# Patient Record
Sex: Male | Born: 1980 | Race: White | Hispanic: No | Marital: Single | Smoking: Current every day smoker
Health system: Southern US, Community
[De-identification: ages and names within clinical notes are randomized; demographics above are authoritative.]

## PROBLEM LIST (undated history)

## (undated) DIAGNOSIS — F1010 Alcohol abuse, uncomplicated: Secondary | ICD-10-CM

## (undated) DIAGNOSIS — Z720 Tobacco use: Secondary | ICD-10-CM

## (undated) DIAGNOSIS — F329 Major depressive disorder, single episode, unspecified: Secondary | ICD-10-CM

## (undated) DIAGNOSIS — F10231 Alcohol dependence with withdrawal delirium: Secondary | ICD-10-CM

## (undated) DIAGNOSIS — K859 Acute pancreatitis without necrosis or infection, unspecified: Secondary | ICD-10-CM

## (undated) DIAGNOSIS — F32A Depression, unspecified: Secondary | ICD-10-CM

## (undated) DIAGNOSIS — R740 Nonspecific elevation of levels of transaminase and lactic acid dehydrogenase [LDH]: Secondary | ICD-10-CM

## (undated) DIAGNOSIS — E119 Type 2 diabetes mellitus without complications: Secondary | ICD-10-CM

## (undated) DIAGNOSIS — K7010 Alcoholic hepatitis without ascites: Secondary | ICD-10-CM

---

## 2005-06-07 ENCOUNTER — Ambulatory Visit (HOSPITAL_COMMUNITY): Admission: RE | Admit: 2005-06-07 | Discharge: 2005-06-07 | Payer: Self-pay | Admitting: Family Medicine

## 2010-05-22 ENCOUNTER — Emergency Department (HOSPITAL_COMMUNITY)
Admission: EM | Admit: 2010-05-22 | Discharge: 2010-05-22 | Disposition: A | Discharge status: Home / Self Care | Payer: Self-pay | Attending: Emergency Medicine | Admitting: Emergency Medicine

## 2010-05-22 DIAGNOSIS — F1010 Alcohol abuse, uncomplicated: Secondary | ICD-10-CM | POA: Insufficient documentation

## 2010-05-22 DIAGNOSIS — Z046 Encounter for general psychiatric examination, requested by authority: Secondary | ICD-10-CM | POA: Insufficient documentation

## 2010-05-22 LAB — DIFFERENTIAL
Basophils Absolute: 0 10*3/uL (ref 0.0–0.1)
Basophils Relative: 1 % (ref 0–1)
Eosinophils Absolute: 0 10*3/uL (ref 0.0–0.7)
Lymphs Abs: 2.4 10*3/uL (ref 0.7–4.0)
Monocytes Absolute: 0.2 10*3/uL (ref 0.1–1.0)
Neutrophils Relative %: 55 % (ref 43–77)

## 2010-05-22 LAB — RAPID URINE DRUG SCREEN, HOSP PERFORMED
Amphetamines: NOT DETECTED
Barbiturates: NOT DETECTED
Benzodiazepines: NOT DETECTED
Tetrahydrocannabinol: NOT DETECTED

## 2010-05-22 LAB — CBC
HCT: 42.2 % (ref 39.0–52.0)
RBC: 4.95 MIL/uL (ref 4.22–5.81)

## 2010-05-22 LAB — HEPATIC FUNCTION PANEL
AST: 83 U/L — ABNORMAL HIGH (ref 0–37)
Albumin: 4.8 g/dL (ref 3.5–5.2)
Bilirubin, Direct: 0.3 mg/dL (ref 0.0–0.3)
Indirect Bilirubin: 0.9 mg/dL (ref 0.3–0.9)
Total Bilirubin: 1.2 mg/dL (ref 0.3–1.2)
Total Protein: 7.8 g/dL (ref 6.0–8.3)

## 2010-05-22 LAB — BASIC METABOLIC PANEL: Calcium: 8.8 mg/dL (ref 8.4–10.5)

## 2010-06-06 ENCOUNTER — Emergency Department (HOSPITAL_COMMUNITY)
Admission: EM | Admit: 2010-06-06 | Discharge: 2010-06-06 | Disposition: A | Discharge status: Home / Self Care | Payer: Self-pay | Attending: Emergency Medicine | Admitting: Emergency Medicine

## 2010-06-06 ENCOUNTER — Emergency Department (HOSPITAL_COMMUNITY): Payer: Self-pay

## 2010-06-06 DIAGNOSIS — F411 Generalized anxiety disorder: Secondary | ICD-10-CM | POA: Insufficient documentation

## 2010-06-06 DIAGNOSIS — F1010 Alcohol abuse, uncomplicated: Secondary | ICD-10-CM | POA: Insufficient documentation

## 2010-06-06 LAB — CBC
HCT: 38.9 % — ABNORMAL LOW (ref 39.0–52.0)
MCH: 30.3 pg (ref 26.0–34.0)
MCHC: 35.7 g/dL (ref 30.0–36.0)
MCV: 84.9 fL (ref 78.0–100.0)
RBC: 4.58 MIL/uL (ref 4.22–5.81)
RDW: 13.8 % (ref 11.5–15.5)
WBC: 5.5 10*3/uL (ref 4.0–10.5)

## 2010-06-06 LAB — BASIC METABOLIC PANEL
CO2: 27 mEq/L (ref 19–32)
Calcium: 9.2 mg/dL (ref 8.4–10.5)
Chloride: 97 mEq/L (ref 96–112)
Creatinine, Ser: 0.64 mg/dL (ref 0.4–1.5)
GFR calc Af Amer: >60 mL/min (ref >60)
Glucose, Bld: 129 mg/dL — ABNORMAL HIGH (ref 70–99)

## 2010-06-06 LAB — URINALYSIS, ROUTINE W REFLEX MICROSCOPIC
Bilirubin Urine: NEGATIVE
Glucose, UA: NEGATIVE mg/dL
Hgb urine dipstick: NEGATIVE
Leukocytes, UA: NEGATIVE
Urobilinogen, UA: 1 mg/dL (ref 0.0–1.0)

## 2010-06-06 LAB — COMPREHENSIVE METABOLIC PANEL
Albumin: 4.7 g/dL (ref 3.5–5.2)
Alkaline Phosphatase: 78 U/L (ref 39–117)
BUN: 13 mg/dL (ref 6–23)
CO2: 26 mEq/L (ref 19–32)
Calcium: 9.2 mg/dL (ref 8.4–10.5)
GFR calc non Af Amer: >60 mL/min (ref >60)
Sodium: 136 mEq/L (ref 135–145)
Total Bilirubin: 1.1 mg/dL (ref 0.3–1.2)
Total Protein: 8.1 g/dL (ref 6.0–8.3)

## 2010-06-06 LAB — RAPID URINE DRUG SCREEN, HOSP PERFORMED: Opiates: NOT DETECTED

## 2010-06-06 LAB — URINE MICROSCOPIC-ADD ON

## 2010-06-30 ENCOUNTER — Emergency Department (HOSPITAL_COMMUNITY)
Admission: EM | Admit: 2010-06-30 | Discharge: 2010-06-30 | Disposition: A | Discharge status: Home / Self Care | Payer: Self-pay | Attending: Emergency Medicine | Admitting: Emergency Medicine

## 2010-06-30 DIAGNOSIS — R4789 Other speech disturbances: Secondary | ICD-10-CM | POA: Insufficient documentation

## 2010-06-30 DIAGNOSIS — R4182 Altered mental status, unspecified: Secondary | ICD-10-CM | POA: Insufficient documentation

## 2010-06-30 LAB — RAPID URINE DRUG SCREEN, HOSP PERFORMED
Barbiturates: NOT DETECTED
Cocaine: NOT DETECTED
Opiates: NOT DETECTED
Tetrahydrocannabinol: NOT DETECTED

## 2010-06-30 LAB — COMPREHENSIVE METABOLIC PANEL
ALT: 27 U/L (ref 0–53)
AST: 31 U/L (ref 0–37)
CO2: 25 mEq/L (ref 19–32)
Chloride: 102 mEq/L (ref 96–112)
Creatinine, Ser: 0.67 mg/dL (ref 0.4–1.5)
GFR calc Af Amer: >60 mL/min (ref >60)
Potassium: 3.7 mEq/L (ref 3.5–5.1)
Total Bilirubin: 0.4 mg/dL (ref 0.3–1.2)

## 2010-06-30 LAB — CBC
HCT: 42.3 % (ref 39.0–52.0)
Hemoglobin: 14.5 g/dL (ref 13.0–17.0)
MCHC: 34.3 g/dL (ref 30.0–36.0)
MCV: 86.3 fL (ref 78.0–100.0)
RBC: 4.9 MIL/uL (ref 4.22–5.81)
RDW: 13.3 % (ref 11.5–15.5)

## 2010-06-30 LAB — DIFFERENTIAL
Basophils Relative: 0 % (ref 0–1)
Eosinophils Relative: 0 % (ref 0–5)
Neutrophils Relative %: 58 % (ref 43–77)

## 2010-06-30 LAB — GLUCOSE, CAPILLARY

## 2010-10-24 ENCOUNTER — Emergency Department (HOSPITAL_COMMUNITY)
Admission: EM | Admit: 2010-10-24 | Discharge: 2010-10-24 | Disposition: A | Discharge status: Home / Self Care | Payer: Self-pay | Attending: Emergency Medicine | Admitting: Emergency Medicine

## 2010-10-24 DIAGNOSIS — F1010 Alcohol abuse, uncomplicated: Secondary | ICD-10-CM | POA: Insufficient documentation

## 2010-10-24 LAB — COMPREHENSIVE METABOLIC PANEL
ALT: 72 U/L — ABNORMAL HIGH (ref 0–53)
AST: 90 U/L — ABNORMAL HIGH (ref 0–37)
Calcium: 9.5 mg/dL (ref 8.4–10.5)
GFR calc Af Amer: >60 mL/min (ref >60)
Sodium: 137 mEq/L (ref 135–145)
Total Protein: 8.4 g/dL — ABNORMAL HIGH (ref 6.0–8.3)

## 2010-10-24 LAB — DIFFERENTIAL
Lymphocytes Relative: 14 % (ref 12–46)
Monocytes Absolute: 0.3 10*3/uL (ref 0.1–1.0)
Neutro Abs: 7.7 10*3/uL (ref 1.7–7.7)
Neutrophils Relative %: 82 % — ABNORMAL HIGH (ref 43–77)

## 2010-10-24 LAB — CBC
HCT: 41.2 % (ref 39.0–52.0)
MCHC: 36.2 g/dL — ABNORMAL HIGH (ref 30.0–36.0)
RBC: 5 MIL/uL (ref 4.22–5.81)

## 2016-07-16 DIAGNOSIS — F10231 Alcohol dependence with withdrawal delirium: Secondary | ICD-10-CM

## 2016-07-16 DIAGNOSIS — F10931 Alcohol use, unspecified with withdrawal delirium: Secondary | ICD-10-CM

## 2016-07-16 HISTORY — DX: Alcohol dependence with withdrawal delirium: F10.231

## 2016-07-16 HISTORY — DX: Alcohol use, unspecified with withdrawal delirium: F10.931

## 2016-07-17 ENCOUNTER — Inpatient Hospital Stay (HOSPITAL_COMMUNITY)
Admission: EM | Admit: 2016-07-17 | Discharge: 2016-07-22 | DRG: 439 | Disposition: A | Discharge status: Home / Self Care | Payer: Self-pay | Attending: Internal Medicine | Admitting: Internal Medicine

## 2016-07-17 ENCOUNTER — Encounter (HOSPITAL_COMMUNITY): Payer: Self-pay

## 2016-07-17 ENCOUNTER — Emergency Department (HOSPITAL_COMMUNITY): Payer: Self-pay

## 2016-07-17 DIAGNOSIS — Z720 Tobacco use: Secondary | ICD-10-CM

## 2016-07-17 DIAGNOSIS — Z950 Presence of cardiac pacemaker: Secondary | ICD-10-CM

## 2016-07-17 DIAGNOSIS — K851 Biliary acute pancreatitis without necrosis or infection: Principal | ICD-10-CM

## 2016-07-17 DIAGNOSIS — F17210 Nicotine dependence, cigarettes, uncomplicated: Secondary | ICD-10-CM | POA: Diagnosis present

## 2016-07-17 DIAGNOSIS — D72829 Elevated white blood cell count, unspecified: Secondary | ICD-10-CM

## 2016-07-17 DIAGNOSIS — R17 Unspecified jaundice: Secondary | ICD-10-CM

## 2016-07-17 DIAGNOSIS — K852 Alcohol induced acute pancreatitis without necrosis or infection: Secondary | ICD-10-CM | POA: Diagnosis present

## 2016-07-17 DIAGNOSIS — R7401 Elevation of levels of liver transaminase levels: Secondary | ICD-10-CM

## 2016-07-17 DIAGNOSIS — R740 Nonspecific elevation of levels of transaminase and lactic acid dehydrogenase [LDH]: Secondary | ICD-10-CM

## 2016-07-17 DIAGNOSIS — E871 Hypo-osmolality and hyponatremia: Secondary | ICD-10-CM | POA: Diagnosis present

## 2016-07-17 DIAGNOSIS — K5900 Constipation, unspecified: Secondary | ICD-10-CM | POA: Diagnosis present

## 2016-07-17 DIAGNOSIS — K7010 Alcoholic hepatitis without ascites: Secondary | ICD-10-CM | POA: Diagnosis present

## 2016-07-17 DIAGNOSIS — F1010 Alcohol abuse, uncomplicated: Secondary | ICD-10-CM | POA: Diagnosis present

## 2016-07-17 DIAGNOSIS — K8521 Alcohol induced acute pancreatitis with uninfected necrosis: Secondary | ICD-10-CM

## 2016-07-17 HISTORY — DX: Alcohol abuse, uncomplicated: F10.10

## 2016-07-17 HISTORY — DX: Elevation of levels of liver transaminase levels: R74.01

## 2016-07-17 HISTORY — DX: Tobacco use: Z72.0

## 2016-07-17 LAB — CBC WITH DIFFERENTIAL/PLATELET
Basophils Absolute: 0 10*3/uL (ref 0.0–0.1)
Basophils Relative: 0 %
Eosinophils Absolute: 0 10*3/uL (ref 0.0–0.7)
Eosinophils Relative: 0 %
HCT: 41.6 % (ref 39.0–52.0)
Hemoglobin: 15 g/dL (ref 13.0–17.0)
LYMPHS PCT: 5 %
Lymphs Abs: 0.6 10*3/uL — ABNORMAL LOW (ref 0.7–4.0)
MCH: 30.1 pg (ref 26.0–34.0)
MCHC: 36.1 g/dL — AB (ref 30.0–36.0)
MCV: 83.5 fL (ref 78.0–100.0)
MONO ABS: 0.6 10*3/uL (ref 0.1–1.0)
MONOS PCT: 4 %
NEUTROS ABS: 12.3 10*3/uL — AB (ref 1.7–7.7)
Neutrophils Relative %: 91 %
Platelets: 98 10*3/uL — ABNORMAL LOW (ref 150–400)
RBC: 4.98 MIL/uL (ref 4.22–5.81)
RDW: 17.2 % — AB (ref 11.5–15.5)
WBC: 13.5 10*3/uL — ABNORMAL HIGH (ref 4.0–10.5)

## 2016-07-17 LAB — COMPREHENSIVE METABOLIC PANEL
ALT: 177 U/L — ABNORMAL HIGH (ref 17–63)
ANION GAP: 21 — AB (ref 5–15)
AST: 367 U/L — ABNORMAL HIGH (ref 15–41)
Albumin: 3.1 g/dL — ABNORMAL LOW (ref 3.5–5.0)
Alkaline Phosphatase: 675 U/L — ABNORMAL HIGH (ref 38–126)
BUN: 15 mg/dL (ref 6–20)
CO2: 22 mmol/L (ref 22–32)
Calcium: 7.2 mg/dL — ABNORMAL LOW (ref 8.9–10.3)
Chloride: 83 mmol/L — ABNORMAL LOW (ref 101–111)
Creatinine, Ser: 0.49 mg/dL — ABNORMAL LOW (ref 0.61–1.24)
GFR calc Af Amer: >60 mL/min (ref >60)
Glucose, Bld: 439 mg/dL — ABNORMAL HIGH (ref 65–99)
POTASSIUM: 4.1 mmol/L (ref 3.5–5.1)
Sodium: 126 mmol/L — ABNORMAL LOW (ref 135–145)
TOTAL PROTEIN: 6.5 g/dL (ref 6.5–8.1)
Total Bilirubin: 21.8 mg/dL (ref 0.3–1.2)

## 2016-07-17 LAB — ACETAMINOPHEN LEVEL: Acetaminophen (Tylenol), Serum: <10 ug/mL — ABNORMAL LOW (ref 10–30)

## 2016-07-17 LAB — LIPASE, BLOOD: Lipase: 103 U/L — ABNORMAL HIGH (ref 11–51)

## 2016-07-17 LAB — PROTIME-INR
INR: 1.37
PROTHROMBIN TIME: 17 s — AB (ref 11.4–15.2)

## 2016-07-17 LAB — ETHANOL: ALCOHOL ETHYL (B): 109 mg/dL — AB (ref <5)

## 2016-07-17 MED ORDER — ONDANSETRON HCL 4 MG PO TABS
4.0000 mg | ORAL_TABLET | Freq: Four times a day (QID) | ORAL | Status: DC | PRN
  Administered 2016-07-20: 4 mg via ORAL
  Filled 2016-07-17: qty 1

## 2016-07-17 MED ORDER — FOLIC ACID 1 MG PO TABS
1.0000 mg | ORAL_TABLET | Freq: Every day | ORAL | Status: DC
  Administered 2016-07-17 – 2016-07-22 (×6): 1 mg via ORAL
  Filled 2016-07-17 (×6): qty 1

## 2016-07-17 MED ORDER — LORAZEPAM 2 MG/ML IJ SOLN
0.5000 mg | Freq: Once | INTRAMUSCULAR | Status: AC
  Administered 2016-07-17: 0.5 mg via INTRAVENOUS

## 2016-07-17 MED ORDER — SODIUM CHLORIDE 0.9 % IV BOLUS (SEPSIS)
1000.0000 mL | Freq: Once | INTRAVENOUS | Status: AC
  Administered 2016-07-17: 1000 mL via INTRAVENOUS

## 2016-07-17 MED ORDER — HYDROMORPHONE HCL 1 MG/ML IJ SOLN
1.0000 mg | Freq: Once | INTRAMUSCULAR | Status: AC
  Administered 2016-07-17: 1 mg via INTRAVENOUS
  Filled 2016-07-17: qty 1

## 2016-07-17 MED ORDER — LORAZEPAM 2 MG/ML IJ SOLN
0.0000 mg | Freq: Two times a day (BID) | INTRAMUSCULAR | Status: DC
  Administered 2016-07-21: 1 mg via INTRAVENOUS
  Filled 2016-07-17 (×4): qty 1

## 2016-07-17 MED ORDER — VITAMIN B-1 100 MG PO TABS
100.0000 mg | ORAL_TABLET | Freq: Every day | ORAL | Status: DC
Start: 2016-07-17 — End: 2016-07-22
  Administered 2016-07-17 – 2016-07-22 (×5): 100 mg via ORAL
  Filled 2016-07-17 (×5): qty 1

## 2016-07-17 MED ORDER — NICOTINE 21 MG/24HR TD PT24
21.0000 mg | MEDICATED_PATCH | Freq: Every day | TRANSDERMAL | Status: DC
  Administered 2016-07-17 – 2016-07-22 (×6): 21 mg via TRANSDERMAL
  Filled 2016-07-17 (×6): qty 1

## 2016-07-17 MED ORDER — PROMETHAZINE HCL 25 MG/ML IJ SOLN
12.5000 mg | Freq: Four times a day (QID) | INTRAMUSCULAR | Status: DC | PRN
  Administered 2016-07-17 – 2016-07-19 (×4): 12.5 mg via INTRAVENOUS
  Filled 2016-07-17 (×4): qty 1

## 2016-07-17 MED ORDER — MORPHINE SULFATE (PF) 2 MG/ML IV SOLN
1.0000 mg | INTRAVENOUS | Status: DC | PRN
  Administered 2016-07-17 – 2016-07-22 (×10): 1 mg via INTRAVENOUS
  Filled 2016-07-17 (×10): qty 1

## 2016-07-17 MED ORDER — PANTOPRAZOLE SODIUM 40 MG IV SOLR
40.0000 mg | Freq: Once | INTRAVENOUS | Status: AC
  Administered 2016-07-17: 40 mg via INTRAVENOUS
  Filled 2016-07-17: qty 40

## 2016-07-17 MED ORDER — ONDANSETRON HCL 4 MG/2ML IJ SOLN
4.0000 mg | Freq: Once | INTRAMUSCULAR | Status: AC
  Administered 2016-07-17: 4 mg via INTRAVENOUS
  Filled 2016-07-17: qty 2

## 2016-07-17 MED ORDER — LORAZEPAM 1 MG PO TABS: 1.0000 mg | ORAL_TABLET | Freq: Four times a day (QID) | ORAL | Status: AC | PRN

## 2016-07-17 MED ORDER — IOPAMIDOL (ISOVUE-300) INJECTION 61%
100.0000 mL | Freq: Once | INTRAVENOUS | Status: AC | PRN
  Administered 2016-07-17: 100 mL via INTRAVENOUS

## 2016-07-17 MED ORDER — TRAMADOL HCL 50 MG PO TABS
50.0000 mg | ORAL_TABLET | Freq: Four times a day (QID) | ORAL | Status: DC | PRN
  Administered 2016-07-17 – 2016-07-20 (×2): 50 mg via ORAL
  Filled 2016-07-17 (×3): qty 1

## 2016-07-17 MED ORDER — THIAMINE HCL 100 MG/ML IJ SOLN
100.0000 mg | Freq: Every day | INTRAMUSCULAR | Status: DC
  Administered 2016-07-18: 100 mg via INTRAVENOUS
  Filled 2016-07-17: qty 2

## 2016-07-17 MED ORDER — ONDANSETRON HCL 4 MG/2ML IJ SOLN
4.0000 mg | Freq: Four times a day (QID) | INTRAMUSCULAR | Status: DC | PRN
  Administered 2016-07-17 (×2): 4 mg via INTRAVENOUS
  Filled 2016-07-17 (×2): qty 2

## 2016-07-17 MED ORDER — LORAZEPAM 2 MG/ML IJ SOLN
0.0000 mg | Freq: Four times a day (QID) | INTRAMUSCULAR | Status: AC
  Administered 2016-07-17: 2 mg via INTRAVENOUS
  Administered 2016-07-18: 1 mg via INTRAVENOUS
  Administered 2016-07-18 (×3): 2 mg via INTRAVENOUS
  Administered 2016-07-19 (×3): 1 mg via INTRAVENOUS
  Filled 2016-07-17 (×7): qty 1

## 2016-07-17 MED ORDER — KETOROLAC TROMETHAMINE 30 MG/ML IJ SOLN
30.0000 mg | Freq: Four times a day (QID) | INTRAMUSCULAR | Status: DC | PRN
  Administered 2016-07-17 – 2016-07-22 (×6): 30 mg via INTRAVENOUS
  Filled 2016-07-17 (×6): qty 1

## 2016-07-17 MED ORDER — ADULT MULTIVITAMIN W/MINERALS CH
1.0000 | ORAL_TABLET | Freq: Every day | ORAL | Status: DC
  Administered 2016-07-17 – 2016-07-22 (×6): 1 via ORAL
  Filled 2016-07-17 (×6): qty 1

## 2016-07-17 MED ORDER — SODIUM CHLORIDE 0.9 % IV SOLN
INTRAVENOUS | Status: DC
  Administered 2016-07-17 – 2016-07-21 (×8): via INTRAVENOUS

## 2016-07-17 MED ORDER — LORAZEPAM 2 MG/ML IJ SOLN
INTRAMUSCULAR | Status: AC
  Filled 2016-07-17: qty 1

## 2016-07-17 MED ORDER — LORAZEPAM 2 MG/ML IJ SOLN
1.0000 mg | Freq: Four times a day (QID) | INTRAMUSCULAR | Status: AC | PRN
  Administered 2016-07-17 – 2016-07-18 (×2): 1 mg via INTRAVENOUS
  Filled 2016-07-17 (×2): qty 1

## 2016-07-17 MED ORDER — SENNOSIDES-DOCUSATE SODIUM 8.6-50 MG PO TABS: 1.0000 | ORAL_TABLET | Freq: Every evening | ORAL | Status: DC | PRN

## 2016-07-17 NOTE — H&P (Signed)
History and Physical    Shawn Watkins BJY:[id number] DOB: 1981-01-14 DOA: 07/17/2016  Referring MD/NP/PA: Bethann BerkshireJoseph Zammit, EDP PCP: Patient, No Pcp Per  Patient coming from: Home  Chief Complaint: Abdominal pain  HPI: Shawn Watkins is a 36 y.o. male with h/o alcoholism and tobacco abuse presents today with 3 days of RUQ abdominal pain with nausea and vomiting. His mother noticed his skin was yellow 2 days ago. He was sober for 2 years, but then started drinking again 6 months ago. In the ED he is noticed to be jaundiced, labs: ETOH 109, AP 675, AST 367, ALT 177, Bili 21.8. CT scan abdomen: findings most compatible with acute on chronic pancreatitis. Admission requested.  Past Medical/Surgical History: Past Medical History:  Diagnosis Date  . Alcohol abuse     History reviewed. No pertinent surgical history.  Social History:  has no tobacco, alcohol, and drug history on file.  Allergies: No Known Allergies  Family History:  No H/o major Cardio-vascular disease, HTN, DM in family members  Prior to Admission medications   Medication Sig Start Date End Date Taking? Authorizing Provider  chlordiazePOXIDE (LIBRIUM) 25 MG capsule Take 1 capsule by mouth. 06/21/16  Yes [provider]  folic acid (FOLVITE) 1 MG tablet Take 1 tablet by mouth daily. 06/21/16  Yes [provider]  Multiple Vitamin (MULTIVITAMIN) capsule Take 1 capsule by mouth daily.   Yes [provider]  acetaminophen (TYLENOL) 325 MG tablet Take 650 mg by mouth every 6 (six) hours as needed for mild pain or moderate pain (tooth pain).    [provider]    Review of Systems:  Constitutional: Denies fever, chills, diaphoresis, appetite change and fatigue.  HEENT: Denies photophobia, eye pain, redness, hearing loss, ear pain, congestion, sore throat, rhinorrhea, sneezing, mouth sores, trouble swallowing, neck pain, neck stiffness and tinnitus.   Respiratory: Denies SOB, DOE, cough, chest  tightness,  and wheezing.   Cardiovascular: Denies chest pain, palpitations and leg swelling.  Gastrointestinal: Denies  diarrhea, constipation, blood in stool and abdominal distention.  Genitourinary: Denies dysuria, urgency, frequency, hematuria, flank pain and difficulty urinating.  Endocrine: Denies: hot or cold intolerance, sweats, changes in hair or nails, polyuria, polydipsia. Musculoskeletal: Denies myalgias, back pain, joint swelling, arthralgias and gait problem.  Skin: Denies pallor, rash and wound.  Neurological: Denies dizziness, seizures, syncope, weakness, light-headedness, numbness and headaches.  Hematological: Denies adenopathy. Easy bruising, personal or family bleeding history  Psychiatric/Behavioral: Denies suicidal ideation, mood changes, confusion, nervousness, sleep disturbance and agitation    Physical Exam: Vitals:   07/17/16 1431 07/17/16 1543 07/17/16 1600 07/17/16 1645  BP: (!) 127/98 (!) 137/93 (!) 131/96 (!) 124/96  Pulse: 98 (!) 108 98 (!) 102  Resp: 19 20  20   Temp: 98.4 F (36.9 C)     TempSrc: Oral     SpO2: 98% 94% 95% 92%     Constitutional:  anxious and jittery  Eyes: PERRL, conjunctival and scleral icterus ENMT: Mucous membranes are moist. Posterior pharynx clear of any exudate or lesions.Normal dentition.  Neck: normal, supple, no masses, no thyromegaly Respiratory: clear to auscultation bilaterally, no wheezing, no crackles. Normal respiratory effort. No accessory muscle use.  Cardiovascular: Regular rate and rhythm, no murmurs / rubs / gallops. No extremity edema. 2+ pedal pulses. No carotid bruits.  Abdomen: tender to palpation of the RUQ, no masses palpated. Bowel sounds positive.  Musculoskeletal: no clubbing / cyanosis. No joint deformity upper and lower extremities. Good ROM, no  contractures. Normal muscle tone.  Skin: yellow skin Neurologic: CN 2-12 grossly intact. Sensation intact, DTR normal. Strength 5/5 in all 4.  Psychiatric:  Normal judgment and insight. Alert and oriented x 3. Normal mood.    Labs on Admission: I have personally reviewed the following labs and imaging studies  CBC:  Recent Labs Lab 07/17/16 1007  WBC 13.5*  NEUTROABS 12.3*  HGB 15.0  HCT 41.6  MCV 83.5  PLT 98*   Basic Metabolic Panel:  Recent Labs Lab 07/17/16 1007  NA 126*  K 4.1  CL 83*  CO2 22  GLUCOSE 439*  BUN 15  CREATININE 0.49*  CALCIUM 7.2*   GFR: CrCl cannot be calculated (Unknown ideal weight.). Liver Function Tests:  Recent Labs Lab 07/17/16 1007  AST 367*  ALT 177*  ALKPHOS 675*  BILITOT 21.8*  PROT 6.5  ALBUMIN 3.1*    Recent Labs Lab 07/17/16 1007  LIPASE 103*   No results for input(s): AMMONIA in the last 168 hours. Coagulation Profile:  Recent Labs Lab 07/17/16 1007  INR 1.37   Cardiac Enzymes: No results for input(s): CKTOTAL, CKMB, CKMBINDEX, TROPONINI in the last 168 hours. BNP (last 3 results) No results for input(s): PROBNP in the last 8760 hours. HbA1C: No results for input(s): HGBA1C in the last 72 hours. CBG: No results for input(s): GLUCAP in the last 168 hours. Lipid Profile: No results for input(s): CHOL, HDL, LDLCALC, TRIG, CHOLHDL, LDLDIRECT in the last 72 hours. Thyroid Function Tests: No results for input(s): TSH, T4TOTAL, FREET4, T3FREE, THYROIDAB in the last 72 hours. Anemia Panel: No results for input(s): VITAMINB12, FOLATE, FERRITIN, TIBC, IRON, RETICCTPCT in the last 72 hours. Urine analysis:    Component Value Date/Time   COLORURINE YELLOW 06/06/2010 0949   APPEARANCEUR CLEAR 06/06/2010 0949   LABSPEC 1.022 06/06/2010 0949   PHURINE 6.0 06/06/2010 0949   GLUCOSEU NEGATIVE 06/06/2010 0949   HGBUR NEGATIVE 06/06/2010 0949   BILIRUBINUR NEGATIVE 06/06/2010 0949   KETONESUR NEGATIVE 06/06/2010 0949   PROTEINUR 30 (A) 06/06/2010 0949   UROBILINOGEN 1.0 06/06/2010 0949   NITRITE NEGATIVE 06/06/2010 0949   LEUKOCYTESUR NEGATIVE 06/06/2010 0949    Sepsis Labs: @LABRCNTIP (procalcitonin:4,lacticidven:4) )No results found for this or any previous visit (from the past 240 hour(s)).   Radiological Exams on Admission: Ct Abdomen Pelvis W Contrast  Addendum Date: 07/17/2016   ADDENDUM REPORT: 07/17/2016 14:49 ADDENDUM: Addendum for addition of IMPRESSION: 3 mm left lower lobe pulmonary nodule. No follow-up needed if patient is low-risk. Non-contrast chest CT can be considered in 12 months if patient is high-risk. This recommendation follows the consensus statement: Guidelines for Management of Incidental Pulmonary Nodules Detected on CT Images: From the Fleischner Society 2017; Radiology 2017; 284:228-243. Electronically Signed   By: Annia Belt M.D.   On: 07/17/2016 14:49   Result Date: 07/17/2016 CLINICAL DATA:  Patient with sharp abdominal pain and loss of appetite. EXAM: CT ABDOMEN AND PELVIS WITH CONTRAST TECHNIQUE: Multidetector CT imaging of the abdomen and pelvis was performed using the standard protocol following bolus administration of intravenous contrast. CONTRAST:  ISOVUE-300 IOPAMIDOL (ISOVUE-300) INJECTION 61% COMPARISON:  None. FINDINGS: Lower chest: Normal heart size. Subpleural ground-glass and consolidative opacities within the right lower lobe. Within the left lower lobe there is a 3 mm nodule (image 10; series 4). Small right pleural effusion. Hepatobiliary: Liver is diffusely low in attenuation compatible with marked steatosis. Gallbladder is unremarkable. No intrahepatic or extrahepatic biliary ductal dilatation. Pancreas: The pancreatic head and proximal  body are edematous with surrounding fat stranding. There is calcification throughout the distal body and tail of the pancreas. Spleen: Unremarkable Adrenals/Urinary Tract: The adrenal glands are normal. Kidneys enhance symmetrically with contrast. No hydronephrosis. Urinary bladder is markedly distended. Stomach/Bowel: There is wall thickening of the second and third portion  of the duodenum. No evidence for bowel obstruction. Mild wall thickening of the proximal transverse colon and rectum. Vascular/Lymphatic: Normal caliber abdominal aorta. Subcentimeter prominent retroperitoneal lymph nodes. Nonocclusive filling defect demonstrated within the central portal vein (image 53; series 5). Prominent and enlarged porta hepatic lymph nodes measuring up to 1.6 cm (image 38; series 2). Reproductive: Prostate unremarkable. Other: Small bilateral fat containing inguinal hernias. Musculoskeletal: No aggressive or acute appearing osseous lesions. IMPRESSION: Findings most compatible with acute on chronic pancreatitis. The pancreatic head/ uncinate process and proximal body all are enlarged and edematous. There is free fluid tracking within the central abdomen and along the right pericolic gutter. Suggestion of a nonocclusive filling defect within the portal vein/proximal SMV raising the possibility of peripheral nonocclusive thrombosis. Mild wall thickening of the proximal transverse colon and rectum likely reactive in etiology. Colitis is not excluded. Reactive wall thickening of the duodenum. Distention of the urinary bladder. Hepatic steatosis. Small right pleural effusion. Opacities within the dependent right lower lobe may represent atelectasis or infection. Electronically Signed: By: Annia Belt M.D. On: 07/17/2016 13:41    EKG: Independently reviewed. None obtained in the ED  Assessment/Plan Principal Problem:   Alcoholic pancreatitis Active Problems:   Acute gallstone pancreatitis   Transaminitis   Leukocytosis   Hyponatremia   ETOH abuse   Tobacco abuse    Acute Pancreatitis -Likely a combination of alcoholic and gallstone pancreatitis given the obstructive pattern of his LFTs. -EDP discussed case with Dr. Karilyn Cota. Patient will likely need an ERCP in am. -Keep NPO except for ice chips, antiemetics, toradol as needed for pain.  Transaminitis -Appears to be in an  obstructive pattern: GI to consider ERCP. -Check RUQ [place]. -Check tylenol level, acute hepatitis panel.  ETOH Abuse -Thiamine/folate. -At risk for DTs and has had ETOH withdrawal seizures in the past. -CIWA protocol. -IV ativan PRN.  Tobacco Abuse -Nicotine patch   DVT prophylaxis: SCDs  Code Status: full code  Family Communication: mother at bedside updated on plan of care and all questions answered  Disposition Plan: anticipate DC once medically ready in approx  72 hours. Consults called: GI  Admission status: inpatient    Time Spent: 90 minutes  Chaya Jan MD [facility] Pager [phone]  If 7PM-7AM, please contact night-coverage [email or web address] Password [email or web address]  07/17/2016, 5:13 PM

## 2016-07-17 NOTE — ED Notes (Signed)
Pt reports no relief with previous interventions.  Notified Dr Aileen PilotZammitt. Gave order for Dilaudid 1 mg IV

## 2016-07-17 NOTE — ED Triage Notes (Signed)
Pt states he is withdrawing from alcohol. Pt is feeling sharp abdominal pain. Pt states he is a heavy drinker and drank a lot of alcohol 5 days ago. Drank 1 beer earlier today. States he's able to eat when he drinks bc the pain is tolerable. Drinks 20 beers a day. His sclera is yellow as well.

## 2016-07-17 NOTE — ED Notes (Signed)
Date and time results received: 07/17/16 1109  Test: Billirubin Critical Value: 21.8  Name of Provider Notified: Dr. Estell HarpinZammit  Orders Received? Or Actions Taken?: EDP notified.

## 2016-07-17 NOTE — ED Notes (Signed)
Patient transported to CT 

## 2016-07-18 ENCOUNTER — Inpatient Hospital Stay (HOSPITAL_COMMUNITY): Payer: Self-pay

## 2016-07-18 ENCOUNTER — Encounter (HOSPITAL_COMMUNITY): Payer: Self-pay

## 2016-07-18 DIAGNOSIS — F1010 Alcohol abuse, uncomplicated: Secondary | ICD-10-CM

## 2016-07-18 DIAGNOSIS — K852 Alcohol induced acute pancreatitis without necrosis or infection: Secondary | ICD-10-CM

## 2016-07-18 DIAGNOSIS — K851 Biliary acute pancreatitis without necrosis or infection: Secondary | ICD-10-CM

## 2016-07-18 DIAGNOSIS — K7010 Alcoholic hepatitis without ascites: Secondary | ICD-10-CM

## 2016-07-18 LAB — COMPREHENSIVE METABOLIC PANEL
ALK PHOS: 620 U/L — AB (ref 38–126)
ALT: 151 U/L — ABNORMAL HIGH (ref 17–63)
ANION GAP: 10 (ref 5–15)
AST: 310 U/L — ABNORMAL HIGH (ref 15–41)
Albumin: 2.8 g/dL — ABNORMAL LOW (ref 3.5–5.0)
BUN: 13 mg/dL (ref 6–20)
CALCIUM: 6.9 mg/dL — AB (ref 8.9–10.3)
CHLORIDE: 95 mmol/L — AB (ref 101–111)
CO2: 27 mmol/L (ref 22–32)
Creatinine, Ser: 0.32 mg/dL — ABNORMAL LOW (ref 0.61–1.24)
GFR calc non Af Amer: >60 mL/min (ref >60)
Glucose, Bld: 332 mg/dL — ABNORMAL HIGH (ref 65–99)
POTASSIUM: 4.3 mmol/L (ref 3.5–5.1)
SODIUM: 132 mmol/L — AB (ref 135–145)
Total Bilirubin: 21.9 mg/dL (ref 0.3–1.2)
Total Protein: 5.8 g/dL — ABNORMAL LOW (ref 6.5–8.1)

## 2016-07-18 LAB — HEPATITIS PANEL, ACUTE
HCV Ab: <0.1 s/co ratio (ref 0.0–0.9)
HEP B C IGM: NEGATIVE
Hep A IgM: NEGATIVE
Hepatitis B Surface Ag: NEGATIVE

## 2016-07-18 LAB — CBC
HEMATOCRIT: 33.3 % — AB (ref 39.0–52.0)
HEMOGLOBIN: 11.6 g/dL — AB (ref 13.0–17.0)
MCH: 29.1 pg (ref 26.0–34.0)
MCHC: 34.8 g/dL (ref 30.0–36.0)
MCV: 83.7 fL (ref 78.0–100.0)
Platelets: 70 10*3/uL — ABNORMAL LOW (ref 150–400)
RBC: 3.98 MIL/uL — AB (ref 4.22–5.81)
RDW: 17.5 % — ABNORMAL HIGH (ref 11.5–15.5)
WBC: 7.2 10*3/uL (ref 4.0–10.5)

## 2016-07-18 LAB — HEPATITIS B SURFACE ANTIGEN: HEP B S AG: NEGATIVE

## 2016-07-18 MED ORDER — PANTOPRAZOLE SODIUM 40 MG IV SOLR
40.0000 mg | INTRAVENOUS | Status: DC
  Administered 2016-07-18 – 2016-07-20 (×3): 40 mg via INTRAVENOUS
  Filled 2016-07-18 (×3): qty 40

## 2016-07-18 NOTE — Progress Notes (Signed)
PROGRESS NOTE    Shawn Watkins  ZOX:[id number] DOB: 09/07/1980 DOA: 07/17/2016 PCP: Patient, No Pcp Per     Brief Narrative:  36 y/o admitted from home on 6/2 due to abdominal pain and "the shakes". Heavy ETOH drinker.   Assessment & Plan:   Principal Problem:   Alcoholic pancreatitis Active Problems:   Acute gallstone pancreatitis   Transaminitis   Leukocytosis   Hyponatremia   ETOH abuse   Tobacco abuse   Transaminitis -Despite cholestatic pattern no bile duct dilatation on CT or [place]. -Viral hep panels are negative. -This most likely represents Alcoholic hepatitis -Will order further labs to score discriminant factor as he may benefit from steroids. -Appreciate GI input and recommendations.  Acute Pancreatitis -Mild, due to ETOH. -Pain improved. -Start clears today.  ETOH Abuse -Thiamine/folate -Continue CIWA protocol. -SW to see for consideration of inpatient ETOH assistance.  Tobacco Abuse -Nicotine patch   DVT prophylaxis: SCDs Code Status: full code Family Communication: patient only Disposition Plan: unclear as of yet  Consultants:   GI, Dr. Karilyn Cota  Procedures:   None  Antimicrobials:  Anti-infectives    None       Subjective: Abdominal pain is improved. No emesis since admission. "Shakes" have improved.  Objective: Vitals:   07/17/16 1645 07/17/16 2130 07/18/16 0114 07/18/16 0624  BP: (!) 124/96 134/83  122/78  Pulse: (!) 102 (!) 106  (!) 108  Resp: 20 18  18   Temp:  98.3 F (36.8 C)  97.8 F (36.6 C)  TempSrc:  Oral  Oral  SpO2: 92% 96%  93%  Weight:   83.9 kg (185 lb)   Height:   6\' 1"  (1.854 m)     Intake/Output Summary (Last 24 hours) at 07/18/16 1448 Last data filed at 07/18/16 0650  Gross per 24 hour  Intake           803.75 ml  Output                0 ml  Net           803.75 ml   Filed Weights   07/18/16 0114  Weight: 83.9 kg (185 lb)    Examination:  General exam: Alert, awake, oriented x 3,  icteric Respiratory system: Clear to auscultation. Respiratory effort normal. Cardiovascular system:RRR. No murmurs, rubs, gallops. Gastrointestinal system: Abdomen is nondistended, soft and nontender.  Normal bowel sounds heard. Central nervous system: Alert and oriented. No focal neurological deficits. Extremities: No C/C/E, +pedal pulses Skin: No rashes, lesions or ulcers Psychiatry: Judgement and insight appear normal. Mood & affect appropriate.     Data Reviewed: I have personally reviewed following labs and imaging studies  CBC:  Recent Labs Lab 07/17/16 1007 07/18/16 0631  WBC 13.5* 7.2  NEUTROABS 12.3*  --   HGB 15.0 11.6*  HCT 41.6 33.3*  MCV 83.5 83.7  PLT 98* 70*   Basic Metabolic Panel:  Recent Labs Lab 07/17/16 1007 07/18/16 0631  NA 126* 132*  K 4.1 4.3  CL 83* 95*  CO2 22 27  GLUCOSE 439* 332*  BUN 15 13  CREATININE 0.49* 0.32*  CALCIUM 7.2* 6.9*   GFR: Estimated Creatinine Clearance: 147 mL/min (A) (by C-G formula based on SCr of 0.32 mg/dL (L)). Liver Function Tests:  Recent Labs Lab 07/17/16 1007 07/18/16 0631  AST 367* 310*  ALT 177* 151*  ALKPHOS 675* 620*  BILITOT 21.8* 21.9*  PROT 6.5 5.8*  ALBUMIN 3.1* 2.8*    Recent  Labs Lab 07/17/16 1007  LIPASE 103*   No results for input(s): AMMONIA in the last 168 hours. Coagulation Profile:  Recent Labs Lab 07/17/16 1007  INR 1.37   Cardiac Enzymes: No results for input(s): CKTOTAL, CKMB, CKMBINDEX, TROPONINI in the last 168 hours. BNP (last 3 results) No results for input(s): PROBNP in the last 8760 hours. HbA1C: No results for input(s): HGBA1C in the last 72 hours. CBG: No results for input(s): GLUCAP in the last 168 hours. Lipid Profile: No results for input(s): CHOL, HDL, LDLCALC, TRIG, CHOLHDL, LDLDIRECT in the last 72 hours. Thyroid Function Tests: No results for input(s): TSH, T4TOTAL, FREET4, T3FREE, THYROIDAB in the last 72 hours. Anemia Panel: No results for  input(s): VITAMINB12, FOLATE, FERRITIN, TIBC, IRON, RETICCTPCT in the last 72 hours. Urine analysis:    Component Value Date/Time   COLORURINE YELLOW 06/06/2010 0949   APPEARANCEUR CLEAR 06/06/2010 0949   LABSPEC 1.022 06/06/2010 0949   PHURINE 6.0 06/06/2010 0949   GLUCOSEU NEGATIVE 06/06/2010 0949   HGBUR NEGATIVE 06/06/2010 0949   BILIRUBINUR NEGATIVE 06/06/2010 0949   KETONESUR NEGATIVE 06/06/2010 0949   PROTEINUR 30 (A) 06/06/2010 0949   UROBILINOGEN 1.0 06/06/2010 0949   NITRITE NEGATIVE 06/06/2010 0949   LEUKOCYTESUR NEGATIVE 06/06/2010 0949   Sepsis Labs: @LABRCNTIP (procalcitonin:4,lacticidven:4)  )No results found for this or any previous visit (from the past 240 hour(s)).       Radiology Studies: Ct Abdomen Pelvis W Contrast  Addendum Date: 07/17/2016   ADDENDUM REPORT: 07/17/2016 14:49 ADDENDUM: Addendum for addition of IMPRESSION: 3 mm left lower lobe pulmonary nodule. No follow-up needed if patient is low-risk. Non-contrast chest CT can be considered in 12 months if patient is high-risk. This recommendation follows the consensus statement: Guidelines for Management of Incidental Pulmonary Nodules Detected on CT Images: From the Fleischner Society 2017; Radiology 2017; 284:228-243. Electronically Signed   By: Annia Belt M.D.   On: 07/17/2016 14:49   Result Date: 07/17/2016 CLINICAL DATA:  Patient with sharp abdominal pain and loss of appetite. EXAM: CT ABDOMEN AND PELVIS WITH CONTRAST TECHNIQUE: Multidetector CT imaging of the abdomen and pelvis was performed using the standard protocol following bolus administration of intravenous contrast. CONTRAST:  ISOVUE-300 IOPAMIDOL (ISOVUE-300) INJECTION 61% COMPARISON:  None. FINDINGS: Lower chest: Normal heart size. Subpleural ground-glass and consolidative opacities within the right lower lobe. Within the left lower lobe there is a 3 mm nodule (image 10; series 4). Small right pleural effusion. Hepatobiliary: Liver is  diffusely low in attenuation compatible with marked steatosis. Gallbladder is unremarkable. No intrahepatic or extrahepatic biliary ductal dilatation. Pancreas: The pancreatic head and proximal body are edematous with surrounding fat stranding. There is calcification throughout the distal body and tail of the pancreas. Spleen: Unremarkable Adrenals/Urinary Tract: The adrenal glands are normal. Kidneys enhance symmetrically with contrast. No hydronephrosis. Urinary bladder is markedly distended. Stomach/Bowel: There is wall thickening of the second and third portion of the duodenum. No evidence for bowel obstruction. Mild wall thickening of the proximal transverse colon and rectum. Vascular/Lymphatic: Normal caliber abdominal aorta. Subcentimeter prominent retroperitoneal lymph nodes. Nonocclusive filling defect demonstrated within the central portal vein (image 53; series 5). Prominent and enlarged porta hepatic lymph nodes measuring up to 1.6 cm (image 38; series 2). Reproductive: Prostate unremarkable. Other: Small bilateral fat containing inguinal hernias. Musculoskeletal: No aggressive or acute appearing osseous lesions. IMPRESSION: Findings most compatible with acute on chronic pancreatitis. The pancreatic head/ uncinate process and proximal body all are enlarged and edematous. There is  free fluid tracking within the central abdomen and along the right pericolic gutter. Suggestion of a nonocclusive filling defect within the portal vein/proximal SMV raising the possibility of peripheral nonocclusive thrombosis. Mild wall thickening of the proximal transverse colon and rectum likely reactive in etiology. Colitis is not excluded. Reactive wall thickening of the duodenum. Distention of the urinary bladder. Hepatic steatosis. Small right pleural effusion. Opacities within the dependent right lower lobe may represent atelectasis or infection. Electronically Signed: By: Annia Beltrew  Davis M.D. On: 07/17/2016 13:41   [place]  Abdomen Limited Ruq  Result Date: 07/18/2016 CLINICAL DATA:  36 year old male with elevated LFTs and pancreatitis. EXAM: US ABDOMEN LIMITED - RIGHT UPPER QUADRANT COMPARISON:  07/17/2016 CT FINDINGS: Gallbladder: There is no evidence of cholelithiasis, gallbladder wall thickening or sonographic Murphy's sign. A small amount of pericholecystic fluid is noted. Common bile duct: Diameter: 2.3 mm. There is no evidence of intrahepatic or extrahepatic biliary dilatation. Liver: Diffusely increased echogenicity noted likely representing hepatic steatosis. No focal hepatic abnormalities are present. IMPRESSION: Increased hepatic echogenicity likely representing hepatic steatosis. No focal hepatic abnormality. No evidence of cholelithiasis or acute cholecystitis. No biliary dilatation. Electronically Signed   By: Harmon PierJeffrey  Hu M.D.   On: 07/18/2016 10:10        Scheduled Meds: . folic acid  1 mg Oral Daily  . LORazepam  0-4 mg Intravenous Q6H   Followed by  . [START ON 07/19/2016] LORazepam  0-4 mg Intravenous Q12H  . multivitamin with minerals  1 tablet Oral Daily  . nicotine  21 mg Transdermal Daily  . pantoprazole (PROTONIX) IV  40 mg Intravenous Q24H  . thiamine  100 mg Oral Daily   Or  . thiamine  100 mg Intravenous Daily   Continuous Infusions: . sodium chloride 75 mL/hr at 07/18/16 0921     LOS: 1 day    Time spent: 25 minutes. Greater than 50% of this time was spent in direct contact with the patient coordinating care.     Chaya JanHERNANDEZ ACOSTA,Ayahna Solazzo, MD [facility] Pager [phone]  If 7PM-7AM, please contact night-coverage [email or web address] Password [email or web address] 07/18/2016, 2:48 PM

## 2016-07-18 NOTE — Consult Note (Signed)
Referring Provider: Ted Mcalpine, Watkins  Primary Care Physician:  Patient, No Pcp Per Primary Gastroenterologist:  Dr. Karilyn Watkins  Reason for Consultation:    Alcoholic liver disease and pancreatitis.  HPI:   History is obtained from the patient and his mother who was at bedside. Patient is 36 year old Caucasian male who states he is been drinking alcohol since he was 35 years old but he has been drinking beer in excessive amounts since age 73. Along the way he has had withdrawal symptoms. He was hospitalized about 7 or 8 years ago for alcohol withdrawal and he also has been treated for pancreatitis initially 12 years ago and more recently 1 year ago. He was able to quit drinking alcohol for 2 years. He decided to go back to drinking in October 2017. He has been drinking anywhere from 12-18 cans of beer a day. He has recently moved from [facility] so that his mother could help him. For the last 5 days he is noted pain in epigastric region as well as right upper quadrant and he has had intermittent nausea and vomiting. He has not experienced hematemesis melena or rectal bleeding. His mother states he also has been complaining of heartburn and she gave him some Pepto-Bismol. He denies anorexia weight loss. He also denies fever he's been having diaphoreses and at times feels cold. His last drink was yesterday morning. He does smoke cigarettes about a pack a day. He is single and does not have any children. He graduated from college in [place] and has been working in [employer]. He has 3 brothers one stepbrother and one stepsister. He states all his brothers drink alcohol. His father is not in good health. He has had pacemaker. His mother is in good health. His parents have been divorced.   Past Medical History:  Diagnosis Date  . Alcohol abuse       History of alcoholic pancreatitis.       History reviewed. No pertinent surgical history.  Prior to Admission  medications   Medication Sig Start Date End Date Taking? Authorizing Provider  chlordiazePOXIDE (LIBRIUM) 25 MG capsule Take 1 capsule by mouth. 06/21/16  Yes Provider, Historical, Watkins  folic acid (FOLVITE) 1 MG tablet Take 1 tablet by mouth daily. 06/21/16  Yes Provider, Historical, Watkins  Multiple Vitamin (MULTIVITAMIN) capsule Take 1 capsule by mouth daily.   Yes Provider, Historical, Watkins  acetaminophen (TYLENOL) 325 MG tablet Take 650 mg by mouth every 6 (six) hours as needed for mild pain or moderate pain (tooth pain).    Provider, Historical, Watkins    Current Facility-Administered Medications  Medication Dose Route Frequency Provider Last Rate Last Dose  . 0.9 %  sodium chloride infusion   Intravenous Continuous Shawn Watkins 75 mL/hr at 07/18/16 1610    . folic acid (FOLVITE) tablet 1 mg  1 mg Oral Daily Shawn Watkins   1 mg at 07/18/16 9604  . ketorolac (TORADOL) 30 MG/ML injection 30 mg  30 mg Intravenous Q6H PRN Shawn Watkins   30 mg at 07/18/16 [phone]  . LORazepam (ATIVAN) injection 0-4 mg  0-4 mg Intravenous Q6H Shawn Watkins   1 mg at 07/18/16 8119   Followed by  . [START ON 07/19/2016] LORazepam (ATIVAN) injection 0-4 mg  0-4 mg Intravenous Q12H Shawn Watkins      . LORazepam (ATIVAN) tablet 1 mg  1 mg Oral Q6H PRN  Shawn Watkins       Or  . LORazepam (ATIVAN) injection 1 mg  1 mg Intravenous Q6H PRN Shawn Watkins   1 mg at 07/18/16 0910  . morphine 2 MG/ML injection 1 mg  1 mg Intravenous Q4H PRN Shawn Grippe, Watkins   1 mg at 07/18/16 0615  . multivitamin with minerals tablet 1 tablet  1 tablet Oral Daily Shawn Watkins   1 tablet at 07/18/16 [phone]  . nicotine (NICODERM CQ - dosed in mg/24 hours) patch 21 mg  21 mg Transdermal Daily Shawn Watkins   21 mg at 07/18/16 0911  . ondansetron (ZOFRAN) tablet 4 mg  4 mg Oral Q6H PRN Shawn Watkins, Shawn Patricia,  Watkins       Or  . ondansetron [facility]) injection 4 mg  4 mg Intravenous Q6H PRN Shawn Watkins   4 mg at 07/17/16 2153  . pantoprazole (PROTONIX) injection 40 mg  40 mg Intravenous Q24H Shawn Milner Watkins, Watkins      . promethazine (PHENERGAN) injection 12.5 mg  12.5 mg Intravenous Q6H PRN Shawn Grippe, Watkins   12.5 mg at 07/18/16 0615  . senna-docusate (Senokot-S) tablet 1 tablet  1 tablet Oral QHS PRN Shawn Watkins      . thiamine (VITAMIN B-1) tablet 100 mg  100 mg Oral Daily Shawn Watkins   100 mg at 07/17/16 2009   Or  . thiamine (B-1) injection 100 mg  100 mg Intravenous Daily Shawn Watkins   100 mg at 07/18/16 0909  . traMADol (ULTRAM) tablet 50 mg  50 mg Oral Q6H PRN Shawn Grippe, Watkins   50 mg at 07/17/16 2153    Allergies as of 07/17/2016  . (No Known Allergies)    History reviewed. No pertinent family history.  Social History   Social History  . Marital status: Single    Spouse name: N/A  . Number of children: N/A  . Years of education: N/A   Occupational History  . Not on file.   Social History Main Topics  . Smoking status: Current Every Day Smoker  . Smokeless tobacco: Not on file  . Alcohol use Yes  . Drug use: Unknown  . Sexual activity: Not on file   Other Topics Concern  . Not on file   Social History Narrative  . No narrative on file    Review of Systems: See HPI, otherwise normal ROS  Physical Exam: Temp:  [97.8 F (36.6 C)-98.4 F (36.9 C)] 97.8 F (36.6 C) (06/03 0624) Pulse Rate:  [98-108] 108 (06/03 0624) Resp:  [18-20] 18 (06/03 0624) BP: (122-141)/(78-104) 122/78 (06/03 0624) SpO2:  [92 %-98 %] 93 % (06/03 0624) Weight:  [185 lb (83.9 kg)] 185 lb (83.9 kg) (06/03 0114) Last BM Date: 07/12/16  Patient is alert and in no acute distress. He is ambulating in the room. He does not have asterixis but he has tremors to both hands. Shawn Watkins is pink and sclerae deeply icteric. Oropharyngeal  mucosa is normal. No neck masses or thyromegaly noted. Cardiac exam with regular rhythm normal S1 and S2. No murmur or gallop noted. Lungs are clear to auscultation. Abdomen is symmetrical. Bowel sounds are normal. On palpation abdomen is soft. Spleen is not palpable. Liver edge is indistinct below RCM. Liver edge is mildly tender. He also has tenderness in midepigastrium without any guarding. No peripheral  edema or clubbing noted.   Lab Results:  Recent Labs  07/17/16 1007 07/18/16 0631  WBC 13.5* 7.2  HGB 15.0 11.6*  HCT 41.6 33.3*  PLT 98* 70*   BMET  Recent Labs  07/17/16 1007 07/18/16 0631  NA 126* 132*  K 4.1 4.3  CL 83* 95*  CO2 22 27  GLUCOSE 439* 332*  BUN 15 13  CREATININE 0.49* 0.32*  CALCIUM 7.2* 6.9*   LFT  Recent Labs  07/18/16 0631  PROT 5.8*  ALBUMIN 2.8*  AST 310*  ALT 151*  ALKPHOS 620*  BILITOT 21.9*   PT/INR  Recent Labs  07/17/16 1007  LABPROT 17.0*  INR 1.37   Hepatitis Panel  Recent Labs  07/17/16 1448  HEPBSAG Negative  Negative  HCVAB <0.1  HEPAIGM Negative  HEPBIGM Negative    Studies/Results: Ct Abdomen Pelvis W Contrast  Addendum Date: 07/17/2016   ADDENDUM REPORT: 07/17/2016 14:49 ADDENDUM: Addendum for addition of IMPRESSION: 3 mm left lower lobe pulmonary nodule. No follow-up needed if patient is low-risk. Non-contrast chest CT can be considered in 12 months if patient is high-risk. This recommendation follows the consensus statement: Guidelines for Management of Incidental Pulmonary Nodules Detected on CT Images: From the Fleischner Society 2017; Radiology 2017; 284:228-243. Electronically Signed   By: Annia Beltrew  Davis M.D.   On: 07/17/2016 14:49   Result Date: 07/17/2016 CLINICAL DATA:  Patient with sharp abdominal pain and loss of appetite. EXAM: CT ABDOMEN AND PELVIS WITH CONTRAST TECHNIQUE: Multidetector CT imaging of the abdomen and pelvis was performed using the standard protocol following bolus administration of  intravenous contrast. CONTRAST:  100mL ISOVUE-300 IOPAMIDOL (ISOVUE-300) INJECTION 61% COMPARISON:  None. FINDINGS: Lower chest: Normal heart size. Subpleural ground-glass and consolidative opacities within the right lower lobe. Within the left lower lobe there is a 3 mm nodule (image 10; series 4). Small right pleural effusion. Hepatobiliary: Liver is diffusely low in attenuation compatible with marked steatosis. Gallbladder is unremarkable. No intrahepatic or extrahepatic biliary ductal dilatation. Pancreas: The pancreatic head and proximal body are edematous with surrounding fat stranding. There is calcification throughout the distal body and tail of the pancreas. Spleen: Unremarkable Adrenals/Urinary Tract: The adrenal glands are normal. Kidneys enhance symmetrically with contrast. No hydronephrosis. Urinary bladder is markedly distended. Stomach/Bowel: There is wall thickening of the second and third portion of the duodenum. No evidence for bowel obstruction. Mild wall thickening of the proximal transverse colon and rectum. Vascular/Lymphatic: Normal caliber abdominal aorta. Subcentimeter prominent retroperitoneal lymph nodes. Nonocclusive filling defect demonstrated within the central portal vein (image 53; series 5). Prominent and enlarged porta hepatic lymph nodes measuring up to 1.6 cm (image 38; series 2). Reproductive: Prostate unremarkable. Other: Small bilateral fat containing inguinal hernias. Musculoskeletal: No aggressive or acute appearing osseous lesions. IMPRESSION: Findings most compatible with acute on chronic pancreatitis. The pancreatic head/ uncinate process and proximal body all are enlarged and edematous. There is free fluid tracking within the central abdomen and along the right pericolic gutter. Suggestion of a nonocclusive filling defect within the portal vein/proximal SMV raising the possibility of peripheral nonocclusive thrombosis. Mild wall thickening of the proximal transverse colon  and rectum likely reactive in etiology. Colitis is not excluded. Reactive wall thickening of the duodenum. Distention of the urinary bladder. Hepatic steatosis. Small right pleural effusion. Opacities within the dependent right lower lobe may represent atelectasis or infection. Electronically Signed: By: Annia Beltrew  Davis M.D. On: 07/17/2016 13:41   [place] Abdomen Limited Ruq  Result Date: 07/18/2016  CLINICAL DATA:  36 year old male with elevated LFTs and pancreatitis. EXAM: US ABDOMEN LIMITED - RIGHT UPPER QUADRANT COMPARISON:  07/17/2016 CT FINDINGS: Gallbladder: There is no evidence of cholelithiasis, gallbladder wall thickening or sonographic Murphy's sign. A small amount of pericholecystic fluid is noted. Common bile duct: Diameter: 2.3 mm. There is no evidence of intrahepatic or extrahepatic biliary dilatation. Liver: Diffusely increased echogenicity noted likely representing hepatic steatosis. No focal hepatic abnormalities are present. IMPRESSION: Increased hepatic echogenicity likely representing hepatic steatosis. No focal hepatic abnormality. No evidence of cholelithiasis or acute cholecystitis. No biliary dilatation. Electronically Signed   By: Harmon Pier M.D.   On: 07/18/2016 10:10   I have reviewed abdominopelvic CT from yesterday and ultrasound from this morning. Findings as above. No evidence of dilated bile ducts or cholelithiasis.   Assessment;  Patient is a 36 year old Caucasian male who has been drinking alcohol in excessive amount since October 2017 who now presents with 5 day history of right upper quadrant and epigastric pain nausea and vomiting. LFTs revealed cholestatic pattern but bile duct is not dilated on CT or ultrasound. He therefore has alcoholic hepatitis. Hepatitis discriminant function is 45 and meld score is 21. Viral markers for hepatitis A, B, and C are negative. Hepatitis discriminant function will be recalculated in a.m. and begin prednisolone unless HDF is trending  downwards. Patient is on CIWA protocol and he is receiving folic acid thiamine and MVI.  Thrombocytopenia may be due to toxic effect of alcohol on bone marrow ordered due to alcoholic liver disease but he does not have splenomegaly.  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 He does not appear to have exocrine pancreatic insufficiency based on history.  Recommendations;  Begin clear liquids. Pantoprazole 40 mg IV every 24 hours. Will repeat LFTs and INR tomorrow and determine if he should be treated with prednisolone. Patient will also be a candidate for inpatient rehabilitation.   LOS: 1 day   Tekesha Almgren  07/18/2016, 1:44 PM

## 2016-07-18 NOTE — Progress Notes (Signed)
PT anxious and confused. Wandering hall looking for room. Reoriented PT and explained importance of hospital admission. Assisted PT back to room and medicated for abdominal pain 8/10. PT now pleasant and cooperative. Continue to monitor and reorient as needed.

## 2016-07-19 ENCOUNTER — Encounter (HOSPITAL_COMMUNITY): Payer: Self-pay

## 2016-07-19 LAB — LIPASE, BLOOD: Lipase: 45 U/L (ref 11–51)

## 2016-07-19 LAB — HEPATITIS PANEL, ACUTE
HCV Ab: <0.1 s/co ratio (ref 0.0–0.9)
HEP B S AG: NEGATIVE
Hep A IgM: NEGATIVE
Hep B C IgM: NEGATIVE

## 2016-07-19 LAB — HEPATIC FUNCTION PANEL
ALBUMIN: 2.5 g/dL — AB (ref 3.5–5.0)
ALT: 137 U/L — ABNORMAL HIGH (ref 17–63)
AST: 231 U/L — AB (ref 15–41)
Alkaline Phosphatase: 660 U/L — ABNORMAL HIGH (ref 38–126)
BILIRUBIN TOTAL: 24.2 mg/dL — AB (ref 0.3–1.2)
Bilirubin, Direct: 16.1 mg/dL — ABNORMAL HIGH (ref 0.1–0.5)
Indirect Bilirubin: 8.1 mg/dL — ABNORMAL HIGH (ref 0.3–0.9)
Total Protein: 5.6 g/dL — ABNORMAL LOW (ref 6.5–8.1)

## 2016-07-19 LAB — PROTIME-INR
INR: 1.47
Prothrombin Time: 17.9 seconds — ABNORMAL HIGH (ref 11.4–15.2)

## 2016-07-19 LAB — HIV ANTIBODY (ROUTINE TESTING W REFLEX): HIV SCREEN 4TH GENERATION: NONREACTIVE

## 2016-07-19 MED ORDER — PREDNISOLONE 5 MG PO TABS
30.0000 mg | ORAL_TABLET | Freq: Every day | ORAL | Status: DC
  Administered 2016-07-19 – 2016-07-22 (×4): 30 mg via ORAL
  Filled 2016-07-19 (×5): qty 6

## 2016-07-19 NOTE — ED Provider Notes (Signed)
[facility]-EMERGENCY DEPT Provider Note   CSN: [id number] Arrival date & time: 07/17/16  0947     History   Chief Complaint Chief Complaint  Patient presents with  . Abdominal Cramping    HPI Shawn Watkins is a 36 y.o. male.  Patient complains of abdominal pain and vomiting.   The history is provided by the patient.  Abdominal Cramping  This is a recurrent problem. The current episode started more than 2 days ago. The problem occurs constantly. The problem has not changed since onset.Associated symptoms include abdominal pain. Pertinent negatives include no chest pain and no headaches. Nothing aggravates the symptoms. Nothing relieves the symptoms. He has tried nothing for the symptoms. The treatment provided no relief.    Past Medical History:  Diagnosis Date  . Alcohol abuse     Patient Active Problem List   Diagnosis Date Noted  . 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 Acute gallstone pancreatitis 07/17/2016  . Transaminitis 07/17/2016  . Leukocytosis 07/17/2016  . Hyponatremia 07/17/2016  . ETOH abuse 07/17/2016  . Tobacco abuse 07/17/2016    History reviewed. No pertinent surgical history.     Home Medications    Prior to Admission medications   Medication Sig Start Date End Date Taking? Authorizing Provider  chlordiazePOXIDE (LIBRIUM) 25 MG capsule Take 1 capsule by mouth. 06/21/16  Yes [provider]  folic acid (FOLVITE) 1 MG tablet Take 1 tablet by mouth daily. 06/21/16  Yes [provider]  Multiple Vitamin (MULTIVITAMIN) capsule Take 1 capsule by mouth daily.   Yes [provider]  acetaminophen (TYLENOL) 325 MG tablet Take 650 mg by mouth every 6 (six) hours as needed for mild pain or moderate pain (tooth pain).    [provider]    Family History History reviewed. No pertinent family history.  Social History Social History  Substance Use Topics  . Smoking status: Current Every Day Smoker    Packs/day: 1.00   Types: Cigarettes  . Smokeless tobacco: Never Used  . Alcohol use Yes     Allergies   Patient has no known allergies.   Review of Systems Review of Systems  Constitutional: Negative for appetite change and fatigue.  HENT: Negative for congestion, ear discharge and sinus pressure.   Eyes: Negative for discharge.  Respiratory: Negative for cough.   Cardiovascular: Negative for chest pain.  Gastrointestinal: Positive for abdominal pain. Negative for diarrhea.  Genitourinary: Negative for frequency and hematuria.  Musculoskeletal: Negative for back pain.  Skin: Negative for rash.  Neurological: Negative for seizures and headaches.  Psychiatric/Behavioral: Negative for hallucinations.     Physical Exam Updated Vital Signs BP (!) 134/91 (BP Location: Left Arm)   Pulse (!) 109   Temp 98.9 F (37.2 C) (Oral)   Resp 18   Ht 6\' 1"  (1.854 m)   Wt 83.9 kg (185 lb)   SpO2 98%   BMI 24.41 kg/m   Physical Exam  Constitutional: He is oriented to person, place, and time. He appears well-developed.  HENT:  Head: Normocephalic.  Sclera icteric  Eyes: Conjunctivae and EOM are normal. No scleral icterus.  Neck: Neck supple. No thyromegaly present.  Cardiovascular: Normal rate and regular rhythm.  Exam reveals no gallop and no friction rub.   No murmur heard. Pulmonary/Chest: No stridor. He has no wheezes. He has no rales. He exhibits no tenderness.  Abdominal: He exhibits no distension. There is tenderness. There is no rebound.  Musculoskeletal: Normal range of motion. He exhibits no  edema.  Lymphadenopathy:    He has no cervical adenopathy.  Neurological: He is oriented to person, place, and time. He exhibits normal muscle tone. Coordination normal.  Skin: No rash noted. No erythema.  Psychiatric: He has a normal mood and affect. His behavior is normal.     ED Treatments / Results  Labs (all labs ordered are listed, but only abnormal results are displayed) Labs Reviewed    CBC WITH DIFFERENTIAL/PLATELET - Abnormal; Notable for the following:       Result Value   WBC 13.5 (*)    MCHC 36.1 (*)    RDW 17.2 (*)    Platelets 98 (*)    Neutro Abs 12.3 (*)    Lymphs Abs 0.6 (*)    All other components within normal limits  COMPREHENSIVE METABOLIC PANEL - Abnormal; Notable for the following:    Sodium 126 (*)    Chloride 83 (*)    Glucose, Bld 439 (*)    Creatinine, Ser 0.49 (*)    Calcium 7.2 (*)    Albumin 3.1 (*)    AST 367 (*)    ALT 177 (*)    Alkaline Phosphatase 675 (*)    Total Bilirubin 21.8 (*)    Anion gap 21 (*)    All other components within normal limits  PROTIME-INR - Abnormal; Notable for the following:    Prothrombin Time 17.0 (*)    All other components within normal limits  LIPASE, BLOOD - Abnormal; Notable for the following:    Lipase 103 (*)    All other components within normal limits  ETHANOL - Abnormal; Notable for the following:    Alcohol, Ethyl (B) 109 (*)    All other components within normal limits  ACETAMINOPHEN LEVEL - Abnormal; Notable for the following:    Acetaminophen (Tylenol), Serum <10 (*)    All other components within normal limits  COMPREHENSIVE METABOLIC PANEL - Abnormal; Notable for the following:    Sodium 132 (*)    Chloride 95 (*)    Glucose, Bld 332 (*)    Creatinine, Ser 0.32 (*)    Calcium 6.9 (*)    Total Protein 5.8 (*)    Albumin 2.8 (*)    AST 310 (*)    ALT 151 (*)    Alkaline Phosphatase 620 (*)    Total Bilirubin 21.9 (*)    All other components within normal limits  CBC - Abnormal; Notable for the following:    RBC 3.98 (*)    Hemoglobin 11.6 (*)    HCT 33.3 (*)    RDW 17.5 (*)    Platelets 70 (*)    All other components within normal limits  ACETAMINOPHEN LEVEL - Abnormal; Notable for the following:    Acetaminophen (Tylenol), Serum <10 (*)    All other components within normal limits  PROTIME-INR - Abnormal; Notable for the following:    Prothrombin Time 17.9 (*)    All  other components within normal limits  HEPATIC FUNCTION PANEL - Abnormal; Notable for the following:    Total Protein 5.6 (*)    Albumin 2.5 (*)    AST 231 (*)    ALT 137 (*)    Alkaline Phosphatase 660 (*)    Total Bilirubin 24.2 (*)    Bilirubin, Direct 16.1 (*)    Indirect Bilirubin 8.1 (*)    All other components within normal limits  HEPATITIS B SURFACE ANTIGEN  HEPATITIS PANEL, ACUTE  HIV ANTIBODY (ROUTINE TESTING)  HEPATITIS PANEL, ACUTE  LIPASE, BLOOD    EKG  EKG Interpretation None       Radiology [place] Abdomen Limited Ruq  Result Date: 07/18/2016 CLINICAL DATA:  36 year old male with elevated LFTs and pancreatitis. EXAM: US ABDOMEN LIMITED - RIGHT UPPER QUADRANT COMPARISON:  07/17/2016 CT FINDINGS: Gallbladder: There is no evidence of cholelithiasis, gallbladder wall thickening or sonographic Murphy's sign. A small amount of pericholecystic fluid is noted. Common bile duct: Diameter: 2.3 mm. There is no evidence of intrahepatic or extrahepatic biliary dilatation. Liver: Diffusely increased echogenicity noted likely representing hepatic steatosis. No focal hepatic abnormalities are present. IMPRESSION: Increased hepatic echogenicity likely representing hepatic steatosis. No focal hepatic abnormality. No evidence of cholelithiasis or acute cholecystitis. No biliary dilatation. Electronically Signed   By: Harmon PierJeffrey  Hu M.D.   On: 07/18/2016 10:10    Procedures Procedures (including critical care time)  Medications Ordered in ED Medications  LORazepam (ATIVAN) tablet 1 mg ( Oral See Alternative 07/18/16 0910)    Or  LORazepam (ATIVAN) injection 1 mg (1 mg Intravenous Given 07/18/16 0910)  thiamine (VITAMIN B-1) tablet 100 mg (100 mg Oral Given 07/19/16 1041)    Or  thiamine (B-1) injection 100 mg ( Intravenous See Alternative 07/19/16 1041)  folic acid (FOLVITE) tablet 1 mg (1 mg Oral Given 07/19/16 1219)  multivitamin with minerals tablet 1 tablet (1 tablet Oral Given 07/19/16 1041)   LORazepam (ATIVAN) injection 0-4 mg (1 mg Intravenous Given 07/19/16 1219)    Followed by  LORazepam (ATIVAN) injection 0-4 mg (not administered)  0.9 %  sodium chloride infusion ( Intravenous New Bag/Given 07/19/16 1504)  ketorolac (TORADOL) 30 MG/ML injection 30 mg (30 mg Intravenous Given 07/18/16 1947)  senna-docusate (Senokot-S) tablet 1 tablet (not administered)  ondansetron (ZOFRAN) tablet 4 mg ( Oral See Alternative 07/17/16 2153)    Or  ondansetron (ZOFRAN) injection 4 mg (4 mg Intravenous Given 07/17/16 2153)  nicotine (NICODERM CQ - dosed in mg/24 hours) patch 21 mg (21 mg Transdermal Patch Applied 07/19/16 1042)  traMADol (ULTRAM) tablet 50 mg (50 mg Oral Given 07/17/16 2153)  morphine 2 MG/ML injection 1 mg (1 mg Intravenous Given 07/19/16 1042)  promethazine (PHENERGAN) injection 12.5 mg (12.5 mg Intravenous Given 07/19/16 0407)  pantoprazole (PROTONIX) injection 40 mg (40 mg Intravenous Given 07/19/16 1503)  prednisoLONE tablet 30 mg (30 mg Oral Given 07/19/16 1041)  sodium chloride 0.9 % bolus 1,000 mL (0 mLs Intravenous Stopped 07/17/16 1146)  ondansetron (ZOFRAN) injection 4 mg (4 mg Intravenous Given 07/17/16 1028)  pantoprazole (PROTONIX) injection 40 mg (40 mg Intravenous Given 07/17/16 1029)  HYDROmorphone (DILAUDID) injection 1 mg (1 mg Intravenous Given 07/17/16 1057)  sodium chloride 0.9 % bolus 1,000 mL (0 mLs Intravenous Stopped 07/17/16 1540)  LORazepam (ATIVAN) injection 0.5 mg (0.5 mg Intravenous Given 07/17/16 1256)  iopamidol (ISOVUE-300) 61 % injection 100 mL (100 mLs Intravenous Contrast Given 07/17/16 1309)  HYDROmorphone (DILAUDID) injection 1 mg (1 mg Intravenous Given 07/17/16 1410)     Initial Impression / Assessment and Plan / ED Course  I have reviewed the triage vital signs and the nursing notes.  Pertinent labs & imaging results that were available during my care of the patient were reviewed by me and considered in my medical decision making (see chart for details).      Patient with jaundice and pancreatitis will be admitted to medicine  Final Clinical Impressions(s) / ED Diagnoses   Final diagnoses:  Transaminitis    New Prescriptions Current Discharge  Medication List       Bethann Berkshire, MD 07/19/16 [phone]

## 2016-07-19 NOTE — Progress Notes (Signed)
PROGRESS NOTE    Mauri Readingimothy J Leone  RUE:[id number] DOB: 01-05-1981 DOA: 07/17/2016 PCP: Patient, No Pcp Per     Brief Narrative:  36 y/o admitted from home on 6/2 due to abdominal pain and "the shakes". Heavy ETOH drinker.   Assessment & Plan:   Principal Problem:   Alcoholic pancreatitis Active Problems:   Acute gallstone pancreatitis   Transaminitis   Leukocytosis   Hyponatremia   ETOH abuse   Tobacco abuse   Transaminitis -Despite cholestatic pattern no bile duct dilatation on CT or US. -Viral hep panels are negative. -This most likely represents Alcoholic hepatitis -Discriminant factor is 46.7: prednisolone has been started. -Appreciate GI input and recommendations.  Acute Pancreatitis -Mild, due to ETOH. -Pain improved. -Start clears today.  ETOH Abuse -Thiamine/folate -Continue CIWA protocol. -SW to see for consideration of inpatient ETOH assistance.  Tobacco Abuse -Nicotine patch   DVT prophylaxis: SCDs Code Status: full code Family Communication: patient only Disposition Plan: unclear as of yet  Consultants:   GI, Dr. Karilyn Cotaehman  Procedures:   None  Antimicrobials:  Anti-infectives    None       Subjective: Seems a little more confused today. Was noted to be wandering around the hallways last night.  Objective: Vitals:   07/18/16 0114 07/18/16 0624 07/18/16 1530 07/18/16 2158  BP:  122/78 137/84 132/77  Pulse:  (!) 108 (!) 118 (!) 110  Resp:  18 18 18   Temp:  97.8 F (36.6 C) 98.2 F (36.8 C) 98.2 F (36.8 C)  TempSrc:  Oral  Oral  SpO2:  93% 96% 94%  Weight: 83.9 kg (185 lb)     Height: 6\' 1"  (1.854 m)       Intake/Output Summary (Last 24 hours) at 07/19/16 1116 Last data filed at 07/19/16 0558  Gross per 24 hour  Intake             2455 ml  Output                0 ml  Net             2455 ml   Filed Weights   07/18/16 0114  Weight: 83.9 kg (185 lb)    Examination:  General exam: Alert, awake, is pleasant but appears  confused. Quite icteric. Respiratory system: Clear to auscultation. Respiratory effort normal. Cardiovascular system:RRR. No murmurs, rubs, gallops. Gastrointestinal system: Abdomen is nondistended, soft and nontender.  Normal bowel sounds heard. Central nervous system: Alert and oriented. No focal neurological deficits. Extremities: No C/C/E, +pedal pulses Skin: No rashes, lesions or ulcers Psychiatry: Judgement and insight appear normal. Mood & affect appropriate.      Data Reviewed: I have personally reviewed following labs and imaging studies  CBC:  Recent Labs Lab 07/17/16 1007 07/18/16 0631  WBC 13.5* 7.2  NEUTROABS 12.3*  --   HGB 15.0 11.6*  HCT 41.6 33.3*  MCV 83.5 83.7  PLT 98* 70*   Basic Metabolic Panel:  Recent Labs Lab 07/17/16 1007 07/18/16 0631  NA 126* 132*  K 4.1 4.3  CL 83* 95*  CO2 22 27  GLUCOSE 439* 332*  BUN 15 13  CREATININE 0.49* 0.32*  CALCIUM 7.2* 6.9*   GFR: Estimated Creatinine Clearance: 147 mL/min (A) (by C-G formula based on SCr of 0.32 mg/dL (L)). Liver Function Tests:  Recent Labs Lab 07/17/16 1007 07/18/16 0631 07/19/16 0440  AST 367* 310* 231*  ALT 177* 151* 137*  ALKPHOS 675* 620* 660*  BILITOT  21.8* 21.9* 24.2*  PROT 6.5 5.8* 5.6*  ALBUMIN 3.1* 2.8* 2.5*    Recent Labs Lab 07/17/16 1007 07/19/16 0440  LIPASE 103* 45   No results for input(s): AMMONIA in the last 168 hours. Coagulation Profile:  Recent Labs Lab 07/17/16 1007 07/19/16 0440  INR 1.37 1.47   Cardiac Enzymes: No results for input(s): CKTOTAL, CKMB, CKMBINDEX, TROPONINI in the last 168 hours. BNP (last 3 results) No results for input(s): PROBNP in the last 8760 hours. HbA1C: No results for input(s): HGBA1C in the last 72 hours. CBG: No results for input(s): GLUCAP in the last 168 hours. Lipid Profile: No results for input(s): CHOL, HDL, LDLCALC, TRIG, CHOLHDL, LDLDIRECT in the last 72 hours. Thyroid Function Tests: No results for  input(s): TSH, T4TOTAL, FREET4, T3FREE, THYROIDAB in the last 72 hours. Anemia Panel: No results for input(s): VITAMINB12, FOLATE, FERRITIN, TIBC, IRON, RETICCTPCT in the last 72 hours. Urine analysis:    Component Value Date/Time   COLORURINE YELLOW 06/06/2010 0949   APPEARANCEUR CLEAR 06/06/2010 0949   LABSPEC 1.022 06/06/2010 0949   PHURINE 6.0 06/06/2010 0949   GLUCOSEU NEGATIVE 06/06/2010 0949   HGBUR NEGATIVE 06/06/2010 0949   BILIRUBINUR NEGATIVE 06/06/2010 0949   KETONESUR NEGATIVE 06/06/2010 0949   PROTEINUR 30 (A) 06/06/2010 0949   UROBILINOGEN 1.0 06/06/2010 0949   NITRITE NEGATIVE 06/06/2010 0949   LEUKOCYTESUR NEGATIVE 06/06/2010 0949   Sepsis Labs: @LABRCNTIP (procalcitonin:4,lacticidven:4)  )No results found for this or any previous visit (from the past 240 hour(s)).       Radiology Studies: Ct Abdomen Pelvis W Contrast  Addendum Date: 07/17/2016   ADDENDUM REPORT: 07/17/2016 14:49 ADDENDUM: Addendum for addition of IMPRESSION: 3 mm left lower lobe pulmonary nodule. No follow-up needed if patient is low-risk. Non-contrast chest CT can be considered in 12 months if patient is high-risk. This recommendation follows the consensus statement: Guidelines for Management of Incidental Pulmonary Nodules Detected on CT Images: From the Fleischner Society 2017; Radiology 2017; 284:228-243. Electronically Signed   By: Annia Belt M.D.   On: 07/17/2016 14:49   Result Date: 07/17/2016 CLINICAL DATA:  Patient with sharp abdominal pain and loss of appetite. EXAM: CT ABDOMEN AND PELVIS WITH CONTRAST TECHNIQUE: Multidetector CT imaging of the abdomen and pelvis was performed using the standard protocol following bolus administration of intravenous contrast. CONTRAST:  ISOVUE-300 IOPAMIDOL (ISOVUE-300) INJECTION 61% COMPARISON:  None. FINDINGS: Lower chest: Normal heart size. Subpleural ground-glass and consolidative opacities within the right lower lobe. Within the left lower lobe  there is a 3 mm nodule (image 10; series 4). Small right pleural effusion. Hepatobiliary: Liver is diffusely low in attenuation compatible with marked steatosis. Gallbladder is unremarkable. No intrahepatic or extrahepatic biliary ductal dilatation. Pancreas: The pancreatic head and proximal body are edematous with surrounding fat stranding. There is calcification throughout the distal body and tail of the pancreas. Spleen: Unremarkable Adrenals/Urinary Tract: The adrenal glands are normal. Kidneys enhance symmetrically with contrast. No hydronephrosis. Urinary bladder is markedly distended. Stomach/Bowel: There is wall thickening of the second and third portion of the duodenum. No evidence for bowel obstruction. Mild wall thickening of the proximal transverse colon and rectum. Vascular/Lymphatic: Normal caliber abdominal aorta. Subcentimeter prominent retroperitoneal lymph nodes. Nonocclusive filling defect demonstrated within the central portal vein (image 53; series 5). Prominent and enlarged porta hepatic lymph nodes measuring up to 1.6 cm (image 38; series 2). Reproductive: Prostate unremarkable. Other: Small bilateral fat containing inguinal hernias. Musculoskeletal: No aggressive or acute appearing osseous lesions. IMPRESSION:  Findings most compatible with acute on chronic pancreatitis. The pancreatic head/ uncinate process and proximal body all are enlarged and edematous. There is free fluid tracking within the central abdomen and along the right pericolic gutter. Suggestion of a nonocclusive filling defect within the portal vein/proximal SMV raising the possibility of peripheral nonocclusive thrombosis. Mild wall thickening of the proximal transverse colon and rectum likely reactive in etiology. Colitis is not excluded. Reactive wall thickening of the duodenum. Distention of the urinary bladder. Hepatic steatosis. Small right pleural effusion. Opacities within the dependent right lower lobe may represent  atelectasis or infection. Electronically Signed: By: Annia Belt M.D. On: 07/17/2016 13:41   US Abdomen Limited Ruq  Result Date: 07/18/2016 CLINICAL DATA:  36 year old male with elevated LFTs and pancreatitis. EXAM: US ABDOMEN LIMITED - RIGHT UPPER QUADRANT COMPARISON:  07/17/2016 CT FINDINGS: Gallbladder: There is no evidence of cholelithiasis, gallbladder wall thickening or sonographic Murphy's sign. A small amount of pericholecystic fluid is noted. Common bile duct: Diameter: 2.3 mm. There is no evidence of intrahepatic or extrahepatic biliary dilatation. Liver: Diffusely increased echogenicity noted likely representing hepatic steatosis. No focal hepatic abnormalities are present. IMPRESSION: Increased hepatic echogenicity likely representing hepatic steatosis. No focal hepatic abnormality. No evidence of cholelithiasis or acute cholecystitis. No biliary dilatation. Electronically Signed   By: Harmon Pier M.D.   On: 07/18/2016 10:10        Scheduled Meds: . folic acid  1 mg Oral Daily  . LORazepam  0-4 mg Intravenous Q6H   Followed by  . LORazepam  0-4 mg Intravenous Q12H  . multivitamin with minerals  1 tablet Oral Daily  . nicotine  21 mg Transdermal Daily  . pantoprazole (PROTONIX) IV  40 mg Intravenous Q24H  . prednisoLONE  30 mg Oral Daily  . thiamine  100 mg Oral Daily   Or  . thiamine  100 mg Intravenous Daily   Continuous Infusions: . sodium chloride 75 mL/hr at 07/19/16 0224     LOS: 2 days    Time spent: 25 minutes. Greater than 50% of this time was spent in direct contact with the patient coordinating care.     Chaya Jan, MD [facility] Pager [phone]  If 7PM-7AM, please contact night-coverage [email or web address] Password [email or web address] 07/19/2016, 11:16 AM

## 2016-07-19 NOTE — Progress Notes (Addendum)
Patient ID: Shawn Watkins, male   DOB: [birth year], 36 y.o.   MRN: [id number] States he feels better.        (90)%). He denies any abdominal pain. No nausea or vomiting. BMs are okay. Having a BM daily.   Blood pressure 132/77, pulse (!) 110, temperature 98.2 F (36.8 C), temperature source Oral, resp. rate 18, height 6\' 1"  (1.854 m), weight 185 lb (83.9 kg), SpO2 94 Alert and oriented. Skin warm and dry, jaundiced. Sclera icteric. Lungs clear. HR regular. No murmur. No hepatomegaly, No splenomegaly. Abdomen soft, full BS+. No tenderness. No edema to lower extremities      Assessment/Plan  Alcoholic hepatitis. Acute on chronic calcific pancreatitis. This am he denies any abdominal pain. Lipase is normal this am.  Thrombocytopenia related to his liver disease. Plan: Start Prednisolone 30mg  and taper by 5mg  weekly.   GI attending note:  HDF 51.3 Diet advanced to full liquids.

## 2016-07-19 NOTE — Progress Notes (Signed)
-  LCSW consulted for current substance abuse:  Daily alcohol use.  LCSW attempted to meet with patient, but patient sleeping in bed, talking to himself.  Attempted to wake patient up and he did awake, but reported he was really tired.  Patient able to say hello, report where he was and why he was admitted. Reported stomach pains, but no other pains.  He was educated that medical team was working to increase his diet and voiced approval. Unable to complete substance abuse assessment at this time.   Patient went back to sleep, asked to come back to room later.  Collateral with family:  While in room, mother also sitting at bedside. Mother Marylu Lund(Janet) reports patient and self are from [place] and have been in [place] for the last 2 weeks.  Mother reports she has 4 boys and all are living in [place] which is why [place]. 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 Mother reports patient has been sober for 2 years, but has declined (unsure what reasons), but reports it is the company that he keeps and he was not raised this way. Mother is observed to be very involved (almost enmeshed with son) AEB explaining different ways she cares for him, provides for him and support.  Mother is requesting in type of help for patient.  LCSW explained patient would need to be agreeable and also consent to speaking with mother regarding outcome of plans, but at this time patient not able to participate.  Mother reports he is a good boy and she has been raising all children since they were 35 years old after their father had an affair with a woman at work.  Reports she and her mother raised children.  Mother reports she recently lost her mother and was very tearful which may also be contributing to etoh use, but still needs to defined in assessment.  Plan:  Discussed  barriers with attending MD and patient currently hallucinating, talking to self and in active DTs. Will hold assessment with patient at this time, but per MD patient is interested in cessation with possible inpatient rehabilitation. Will assess motivation with patient once able to participate and follow up with appropriate referrals.  CIWA protocol currently in place. Patient is able to DC home with mother if needs and unable or not agreeable to inpatient rehab.  UDS on admission: ETOH 109  Deretha EmoryHannah Couper Juncaj LCSW, MSW Clinical Social Work: Optician, dispensingystem [employer] Coverage for :  [phone]

## 2016-07-20 ENCOUNTER — Inpatient Hospital Stay (HOSPITAL_COMMUNITY): Payer: Self-pay

## 2016-07-20 DIAGNOSIS — K7010 Alcoholic hepatitis without ascites: Secondary | ICD-10-CM

## 2016-07-20 DIAGNOSIS — K852 Alcohol induced acute pancreatitis without necrosis or infection: Secondary | ICD-10-CM

## 2016-07-20 LAB — COMPREHENSIVE METABOLIC PANEL
ALT: 137 U/L — AB (ref 17–63)
AST: 172 U/L — AB (ref 15–41)
Albumin: 2.5 g/dL — ABNORMAL LOW (ref 3.5–5.0)
Alkaline Phosphatase: 965 U/L — ABNORMAL HIGH (ref 38–126)
Anion gap: 8 (ref 5–15)
BUN: 12 mg/dL (ref 6–20)
CHLORIDE: 99 mmol/L — AB (ref 101–111)
CO2: 22 mmol/L (ref 22–32)
Calcium: 7.6 mg/dL — ABNORMAL LOW (ref 8.9–10.3)
Glucose, Bld: 260 mg/dL — ABNORMAL HIGH (ref 65–99)
POTASSIUM: 4.4 mmol/L (ref 3.5–5.1)
SODIUM: 129 mmol/L — AB (ref 135–145)
Total Bilirubin: 26.4 mg/dL (ref 0.3–1.2)
Total Protein: 5.7 g/dL — ABNORMAL LOW (ref 6.5–8.1)

## 2016-07-20 LAB — CBC
HCT: 29.8 % — ABNORMAL LOW (ref 39.0–52.0)
Hemoglobin: 10.2 g/dL — ABNORMAL LOW (ref 13.0–17.0)
MCH: 28.8 pg (ref 26.0–34.0)
MCHC: 34.2 g/dL (ref 30.0–36.0)
MCV: 84.2 fL (ref 78.0–100.0)
PLATELETS: 89 10*3/uL — AB (ref 150–400)
RBC: 3.54 MIL/uL — AB (ref 4.22–5.81)
RDW: 18.1 % — AB (ref 11.5–15.5)
WBC: 5.9 10*3/uL (ref 4.0–10.5)

## 2016-07-20 LAB — PROTIME-INR
INR: 1.39
Prothrombin Time: 17.2 seconds — ABNORMAL HIGH (ref 11.4–15.2)

## 2016-07-20 NOTE — Care Management (Signed)
CM attempted to assess pt. Pt states he has been bombarded with people to see today and would like for me to leave and come back another day.

## 2016-07-20 NOTE — Progress Notes (Signed)
Inpatient Diabetes Program Recommendations  AACE/ADA: New Consensus Statement on Inpatient Glycemic Control (2015)  Target Ranges:  Prepandial:   less than 140 mg/dL      Peak postprandial:   less than 180 mg/dL (1-2 hours)      Critically ill patients:  140 - 180 mg/dL   Results for [name], Bosco J (MRN [id number]) as of 07/20/2016 08:28  Ref. Range 07/17/2016 10:07 07/18/2016 06:31 07/20/2016 04:36  Glucose Latest Ref Range: 65 - 99 mg/dL 409439 (H) 811332 (H) 914260 (H)   Review of Glycemic Control  Diabetes history: No Outpatient Diabetes medications: NA Current orders for Inpatient glycemic control: None  Inpatient Diabetes Program Recommendations: Correction (SSI): Please consider ordering CBGS with Novolog 0-15 units TID with meals and Novolog 0-5 units QHS. HgbA1C: Please consider ordering an A1C to evaluate glycemic control over the past 2-3 months.  NOTE: No hx of DM noted in chart. Initial glucose 439 mg/dl on 7/8/296/2/18 and patient admitted with acute pancreatitis.   Thanks, Orlando PennerMarie Shanera Meske, RN, MSN, CDE Diabetes Coordinator Inpatient Diabetes Program [phone] (Team Pager from 8am to 5pm)

## 2016-07-20 NOTE — Progress Notes (Signed)
  Subjective:  Patient denies abdominal pain. Says he has good appetite. He also denies nausea or vomiting. He states he was constipated. He finally had a BM this morning and noted scant amount of blood. He did experience lower abdominal pain while he was having BM.  Objective: Blood pressure 132/74, pulse 82, temperature 98.1 F (36.7 C), temperature source Oral, resp. rate 18, height 6\' 1"  (1.854 m), weight 185 lb (83.9 kg), SpO2 99 %. Patient is alert and does not have asterixis or tremors. He remains deeply jaundiced.   Labs/studies Results:   Recent Labs  07/17/16 1007 07/18/16 0631 07/20/16 0436  WBC 13.5* 7.2 5.9  HGB 15.0 11.6* 10.2*  HCT 41.6 33.3* 29.8*  PLT 98* 70* 89*    BMET   Recent Labs  07/17/16 1007 07/18/16 0631 07/20/16 0436  NA 126* 132* 129*  K 4.1 4.3 4.4  CL 83* 95* 99*  CO2 22 27 22   GLUCOSE 439* 332* 260*  BUN 15 13 12   CREATININE 0.49* 0.32* <0.30*  CALCIUM 7.2* 6.9* 7.6*    LFT   Recent Labs  07/18/16 0631 07/19/16 0440 07/20/16 0436  PROT 5.8* 5.6* 5.7*  ALBUMIN 2.8* 2.5* 2.5*  AST 310* 231* 172*  ALT 151* 137* 137*  ALKPHOS 620* 660* 965*  BILITOT 21.9* 24.2* 26.4*  BILIDIR  --  16.1*  --   IBILI  --  8.1*  --     PT/INR   Recent Labs  07/19/16 0440 07/20/16 0436  LABPROT 17.9* 17.2*  INR 1.47 1.39    Hepatitis Panel   Recent Labs  07/17/16 1940  HEPBSAG Negative  HCVAB <0.1  HEPAIGM Negative  HEPBIGM Negative     Assessment:  #1. Profound cholestasis secondary to alcoholic hepatitis. Day 2 on prednisone. Bilirubin has increased significantly in the last 24 hours. Imaging studies reviewed reviewed with Dr. Tyron RussellBoles. No bile duct dilation. Will further evaluate with MRCP so as not to miss biliary obstruction given history of pancreatitis. HDF is 50. INR has slightly improved which is encouraging.  #2. Acute on chronic pancreatitis. He has pancreatic calcifications predominantly in pancreatic tail. He is now  pain-free and tolerating full liquid diet.  #3. Ethanol abuse. Patient is on CIWA recall and not showing any signs of alcohol withdrawal.   Recommendations:  MRCP later today. Repeat lab in a.m.

## 2016-07-20 NOTE — Progress Notes (Signed)
MRCP results reviewed with patient's mother. No evidence of biliary dilation or choledocholithiasis. Entrance with changes of acute and chronic pancreatitis and pancreatic ductal dilation in detail. Therefore cholestasis secondary to alcoholic hepatitis. Will repeat LFTs and INR in a.m.

## 2016-07-20 NOTE — Progress Notes (Signed)
PROGRESS NOTE    STEFANOS HAYNESWORTH  ZOX:[id number] DOB: 1980-11-12 DOA: 07/17/2016 PCP: Patient, No Pcp Per     Brief Narrative:  36 y/o admitted from home on 6/2 due to abdominal pain and "the shakes". Heavy ETOH drinker.   Assessment & Plan:   Principal Problem:   Alcoholic pancreatitis Active Problems:   Acute gallstone pancreatitis   Transaminitis   Leukocytosis   Hyponatremia   ETOH abuse   Tobacco abuse   Transaminitis -Despite cholestatic pattern no bile duct dilatation on CT or [place]. -MRCP: No intrahepatic or extrahepatic ductal dilatation. Common duct measures 3-4 mm. No choledocholithiasis is seen. Acute pancreatitis.  No peripancreatic fluid collection/pseudocyst. Possible focal dilatation the pancreatic duct in the distal pancreatic tail. Reason for satellite closely door in. An underlying pancreatic lesion is difficult to entirely exclude in the pancreatic tail given the limitations of the current study. Consider follow MRI abdomen with/without contrast in 4-6 weeks. Alternatively, consider EUS. Moderate hepatic steatosis. -Viral hep panels are negative. -This most likely represents Alcoholic hepatitis -Discriminant factor is 46.7: prednisolone has been started. -Appreciate GI input and recommendations.  Acute Pancreatitis -Mild, due to ETOH. -Pain improved. -Diet advanced to full liquids today: tolerating well.  ETOH Abuse -Thiamine/folate -Continue CIWA protocol. -SW to see for consideration of inpatient ETOH assistance.  Tobacco Abuse -Nicotine patch   DVT prophylaxis: SCDs Code Status: full code Family Communication: patient only Disposition Plan: unclear as of yet, hope for ETOH rehab.  Consultants:   GI, Dr. Karilyn Cota  Procedures:   None  Antimicrobials:  Anti-infectives    None       Subjective: More alert today. Appears more jaundiced. Tolerating diet, no pain, has had some diarrhea today.  Objective: Vitals:   07/19/16  1508 07/19/16 2016 07/19/16 2125 07/20/16 0641  BP: (!) 134/91  126/85 132/74  Pulse: (!) 109  96 82  Resp: 18  18 18   Temp: 98.9 F (37.2 C)  98.8 F (37.1 C) 98.1 F (36.7 C)  TempSrc: Oral  Oral Oral  SpO2: 98% 97% 99% 99%  Weight:      Height:        Intake/Output Summary (Last 24 hours) at 07/20/16 1344 Last data filed at 07/20/16 0900  Gross per 24 hour  Intake           3132.5 ml  Output              600 ml  Net           2532.5 ml   Filed Weights   07/18/16 0114  Weight: 83.9 kg (185 lb)    Examination:  General exam: Alert, awake, oriented x 3, jaundiced Respiratory system: Clear to auscultation. Respiratory effort normal. Cardiovascular system:RRR. No murmurs, rubs, gallops. Gastrointestinal system: Abdomen is nondistended, soft and nontender. Normal bowel sounds heard. Central nervous system: Alert and oriented. No focal neurological deficits. Extremities: No C/C/E, +pedal pulses Skin: No rashes, lesions or ulcers Psychiatry: Judgement and insight appear normal. Mood & affect appropriate.       Data Reviewed: I have personally reviewed following labs and imaging studies  CBC:  Recent Labs Lab 07/17/16 1007 07/18/16 0631 07/20/16 0436  WBC 13.5* 7.2 5.9  NEUTROABS 12.3*  --   --   HGB 15.0 11.6* 10.2*  HCT 41.6 33.3* 29.8*  MCV 83.5 83.7 84.2  PLT 98* 70* 89*   Basic Metabolic Panel:  Recent Labs Lab 07/17/16 1007 07/18/16 0631 07/20/16 0436  NA  126* 132* 129*  K 4.1 4.3 4.4  CL 83* 95* 99*  CO2 22 27 22   GLUCOSE 439* 332* 260*  BUN 15 13 12   CREATININE 0.49* 0.32* <0.30*  CALCIUM 7.2* 6.9* 7.6*   GFR: CrCl cannot be calculated (This lab value cannot be used to calculate CrCl because it is not a number: <0.30). Liver Function Tests:  Recent Labs Lab 07/17/16 1007 07/18/16 0631 07/19/16 0440 07/20/16 0436  AST 367* 310* 231* 172*  ALT 177* 151* 137* 137*  ALKPHOS 675* 620* 660* 965*  BILITOT 21.8* 21.9* 24.2* 26.4*  PROT  6.5 5.8* 5.6* 5.7*  ALBUMIN 3.1* 2.8* 2.5* 2.5*    Recent Labs Lab 07/17/16 1007 07/19/16 0440  LIPASE 103* 45   No results for input(s): AMMONIA in the last 168 hours. Coagulation Profile:  Recent Labs Lab 07/17/16 1007 07/19/16 0440 07/20/16 0436  INR 1.37 1.47 1.39   Cardiac Enzymes: No results for input(s): CKTOTAL, CKMB, CKMBINDEX, TROPONINI in the last 168 hours. BNP (last 3 results) No results for input(s): PROBNP in the last 8760 hours. HbA1C: No results for input(s): HGBA1C in the last 72 hours. CBG: No results for input(s): GLUCAP in the last 168 hours. Lipid Profile: No results for input(s): CHOL, HDL, LDLCALC, TRIG, CHOLHDL, LDLDIRECT in the last 72 hours. Thyroid Function Tests: No results for input(s): TSH, T4TOTAL, FREET4, T3FREE, THYROIDAB in the last 72 hours. Anemia Panel: No results for input(s): VITAMINB12, FOLATE, FERRITIN, TIBC, IRON, RETICCTPCT in the last 72 hours. Urine analysis:    Component Value Date/Time   COLORURINE YELLOW 06/06/2010 0949   APPEARANCEUR CLEAR 06/06/2010 0949   LABSPEC 1.022 06/06/2010 0949   PHURINE 6.0 06/06/2010 0949   GLUCOSEU NEGATIVE 06/06/2010 0949   HGBUR NEGATIVE 06/06/2010 0949   BILIRUBINUR NEGATIVE 06/06/2010 0949   KETONESUR NEGATIVE 06/06/2010 0949   PROTEINUR 30 (A) 06/06/2010 0949   UROBILINOGEN 1.0 06/06/2010 0949   NITRITE NEGATIVE 06/06/2010 0949   LEUKOCYTESUR NEGATIVE 06/06/2010 0949   Sepsis Labs: @LABRCNTIP (procalcitonin:4,lacticidven:4)  )No results found for this or any previous visit (from the past 240 hour(s)).       Radiology Studies: Mr Abdomen Mrcp Wo Contrast  Result Date: 07/20/2016 CLINICAL DATA:  Jaundice, diarrhea, upper abdominal pain x2 weeks, pancreatitis EXAM: MRI ABDOMEN WITHOUT CONTRAST  (INCLUDING MRCP) TECHNIQUE: Multiplanar multisequence MR imaging of the abdomen was performed. Heavily T2-weighted images of the biliary and pancreatic ducts were obtained, and  three-dimensional MRCP images were rendered by post processing. COMPARISON:  Right upper quadrant ultrasound dated 07/18/2016. CT abdomen/pelvis dated 07/17/2016. FINDINGS: Motion degraded images. Lower chest: Mild patchy right lower lobe opacity, likely atelectasis. Hepatobiliary: Moderate hepatic steatosis. No focal hepatic lesion is seen. Gallbladder is unremarkable.  No associated inflammatory changes. No intrahepatic or extrahepatic ductal dilatation. Common duct measures 3-4 mm. No choledocholithiasis is seen. Pancreas: Pancreas is poorly evaluated on this noncontrast, motion degraded study. Peripancreatic inflammatory changes, reflecting acute pancreatitis, with loss of the intrinsic T1 hyperintensity within the pancreatic head and proximal body. No peripancreatic fluid collection/pseudocyst. Possible focal dilatation of the pancreatic duct in the distal pancreatic tail (series 18/ image 25), although this is obscured by coarse parenchymal calcifications on CT. An underlying pancreatic lesion is difficult to entirely exclude in the pancreatic tail (series 18/ image 28), although equivocal. Spleen:  Within normal limits. Adrenals/Urinary Tract:  Adrenal glands are within normal limits. Kidneys are within normal limits.  No hydronephrosis. Stomach/Bowel: Stomach is within normal limits. Visualized bowel is unremarkable. Vascular/Lymphatic:  No evidence of abdominal aortic aneurysm. No suspicious abdominal lymphadenopathy. Other:  Mild abdominal stranding/ fluid. Musculoskeletal: No focal osseous lesions. IMPRESSION: Limited evaluation due to motion degradation and lack of intravenous contrast administration. No intrahepatic or extrahepatic ductal dilatation. Common duct measures 3-4 mm. No choledocholithiasis is seen. Acute pancreatitis.  No peripancreatic fluid collection/pseudocyst. Possible focal dilatation the pancreatic duct in the distal pancreatic tail. Reason for satellite closely door in. An underlying  pancreatic lesion is difficult to entirely exclude in the pancreatic tail given the limitations of the current study. Consider follow MRI abdomen with/without contrast in 4-6 weeks. Alternatively, consider EUS. Moderate hepatic steatosis. Electronically Signed   By: Charline Bills M.D.   On: 07/20/2016 11:58   Mr 3d Recon At Scanner  Result Date: 07/20/2016 CLINICAL DATA:  Jaundice, diarrhea, upper abdominal pain x2 weeks, pancreatitis EXAM: MRI ABDOMEN WITHOUT CONTRAST  (INCLUDING MRCP) TECHNIQUE: Multiplanar multisequence MR imaging of the abdomen was performed. Heavily T2-weighted images of the biliary and pancreatic ducts were obtained, and three-dimensional MRCP images were rendered by post processing. COMPARISON:  Right upper quadrant ultrasound dated 07/18/2016. CT abdomen/pelvis dated 07/17/2016. FINDINGS: Motion degraded images. Lower chest: Mild patchy right lower lobe opacity, likely atelectasis. Hepatobiliary: Moderate hepatic steatosis. No focal hepatic lesion is seen. Gallbladder is unremarkable.  No associated inflammatory changes. No intrahepatic or extrahepatic ductal dilatation. Common duct measures 3-4 mm. No choledocholithiasis is seen. Pancreas: Pancreas is poorly evaluated on this noncontrast, motion degraded study. Peripancreatic inflammatory changes, reflecting acute pancreatitis, with loss of the intrinsic T1 hyperintensity within the pancreatic head and proximal body. No peripancreatic fluid collection/pseudocyst. Possible focal dilatation of the pancreatic duct in the distal pancreatic tail (series 18/ image 25), although this is obscured by coarse parenchymal calcifications on CT. An underlying pancreatic lesion is difficult to entirely exclude in the pancreatic tail (series 18/ image 28), although equivocal. Spleen:  Within normal limits. Adrenals/Urinary Tract:  Adrenal glands are within normal limits. Kidneys are within normal limits.  No hydronephrosis. Stomach/Bowel: Stomach is  within normal limits. Visualized bowel is unremarkable. Vascular/Lymphatic:  No evidence of abdominal aortic aneurysm. No suspicious abdominal lymphadenopathy. Other:  Mild abdominal stranding/ fluid. Musculoskeletal: No focal osseous lesions. IMPRESSION: Limited evaluation due to motion degradation and lack of intravenous contrast administration. No intrahepatic or extrahepatic ductal dilatation. Common duct measures 3-4 mm. No choledocholithiasis is seen. Acute pancreatitis.  No peripancreatic fluid collection/pseudocyst. Possible focal dilatation the pancreatic duct in the distal pancreatic tail. Reason for satellite closely door in. An underlying pancreatic lesion is difficult to entirely exclude in the pancreatic tail given the limitations of the current study. Consider follow MRI abdomen with/without contrast in 4-6 weeks. Alternatively, consider EUS. Moderate hepatic steatosis. Electronically Signed   By: Charline Bills M.D.   On: 07/20/2016 11:58        Scheduled Meds: . folic acid  1 mg Oral Daily  . LORazepam  0-4 mg Intravenous Q12H  . multivitamin with minerals  1 tablet Oral Daily  . nicotine  21 mg Transdermal Daily  . pantoprazole (PROTONIX) IV  40 mg Intravenous Q24H  . prednisoLONE  30 mg Oral Daily  . thiamine  100 mg Oral Daily   Or  . thiamine  100 mg Intravenous Daily   Continuous Infusions: . sodium chloride 75 mL/hr at 07/20/16 0432     LOS: 3 days    Time spent: 25 minutes. Greater than 50% of this time was spent in direct contact with the patient coordinating  care.     Chaya JanHERNANDEZ ACOSTA,Evalette Montrose, MD [facility] Pager [phone]  If 7PM-7AM, please contact night-coverage [email or web address] Password [email or web address] 07/20/2016, 1:44 PM

## 2016-07-21 DIAGNOSIS — F1010 Alcohol abuse, uncomplicated: Secondary | ICD-10-CM

## 2016-07-21 DIAGNOSIS — E871 Hypo-osmolality and hyponatremia: Secondary | ICD-10-CM

## 2016-07-21 DIAGNOSIS — K7010 Alcoholic hepatitis without ascites: Secondary | ICD-10-CM

## 2016-07-21 LAB — COMPREHENSIVE METABOLIC PANEL
ALT: 162 U/L — ABNORMAL HIGH (ref 17–63)
ANION GAP: 9 (ref 5–15)
AST: 155 U/L — ABNORMAL HIGH (ref 15–41)
Albumin: 2.7 g/dL — ABNORMAL LOW (ref 3.5–5.0)
Alkaline Phosphatase: 1280 U/L — ABNORMAL HIGH (ref 38–126)
BUN: 12 mg/dL (ref 6–20)
CHLORIDE: 99 mmol/L — AB (ref 101–111)
CO2: 23 mmol/L (ref 22–32)
Calcium: 8.7 mg/dL — ABNORMAL LOW (ref 8.9–10.3)
Glucose, Bld: 295 mg/dL — ABNORMAL HIGH (ref 65–99)
POTASSIUM: 4.3 mmol/L (ref 3.5–5.1)
SODIUM: 131 mmol/L — AB (ref 135–145)
Total Bilirubin: 26.5 mg/dL (ref 0.3–1.2)
Total Protein: 6.4 g/dL — ABNORMAL LOW (ref 6.5–8.1)

## 2016-07-21 LAB — GLUCOSE, CAPILLARY
GLUCOSE-CAPILLARY: 249 mg/dL — AB (ref 65–99)
GLUCOSE-CAPILLARY: 284 mg/dL — AB (ref 65–99)
Glucose-Capillary: 298 mg/dL — ABNORMAL HIGH (ref 65–99)

## 2016-07-21 LAB — PROTIME-INR
INR: 1.33
PROTHROMBIN TIME: 16.6 s — AB (ref 11.4–15.2)

## 2016-07-21 MED ORDER — INSULIN ASPART 100 UNIT/ML ~~LOC~~ SOLN
0.0000 [IU] | Freq: Every day | SUBCUTANEOUS | Status: DC
  Administered 2016-07-21: 3 [IU] via SUBCUTANEOUS

## 2016-07-21 MED ORDER — INSULIN ASPART 100 UNIT/ML ~~LOC~~ SOLN
0.0000 [IU] | Freq: Three times a day (TID) | SUBCUTANEOUS | Status: DC
  Administered 2016-07-21: 5 [IU] via SUBCUTANEOUS
  Administered 2016-07-21: 8 [IU] via SUBCUTANEOUS
  Administered 2016-07-22: 5 [IU] via SUBCUTANEOUS
  Administered 2016-07-22: 8 [IU] via SUBCUTANEOUS

## 2016-07-21 MED ORDER — OXAZEPAM 15 MG PO CAPS: 30.0000 mg | ORAL_CAPSULE | Freq: Once | ORAL | Status: DC

## 2016-07-21 MED ORDER — PANTOPRAZOLE SODIUM 40 MG PO TBEC
40.0000 mg | DELAYED_RELEASE_TABLET | Freq: Every day | ORAL | Status: DC
  Administered 2016-07-21 – 2016-07-22 (×2): 40 mg via ORAL
  Filled 2016-07-21 (×2): qty 1

## 2016-07-21 MED ORDER — LORAZEPAM 1 MG PO TABS
2.0000 mg | ORAL_TABLET | Freq: Once | ORAL | Status: AC
  Administered 2016-07-21: 2 mg via ORAL
  Filled 2016-07-21: qty 2

## 2016-07-21 MED ORDER — LORAZEPAM 2 MG/ML IJ SOLN: 1.0000 mg | Freq: Four times a day (QID) | INTRAMUSCULAR | Status: DC | PRN

## 2016-07-21 MED ORDER — LORAZEPAM 1 MG PO TABS
1.0000 mg | ORAL_TABLET | Freq: Four times a day (QID) | ORAL | Status: DC | PRN
  Administered 2016-07-21: 1 mg via ORAL
  Filled 2016-07-21: qty 1

## 2016-07-21 NOTE — Progress Notes (Signed)
CRITICAL VALUE ALERT  Critical Value:  Total Bilirubin 25.6  Date & Time Notied:  07/21/2016 at 0745am  Provider Notified: Dr  Conley RollsLe  Orders Received/Actions taken: Awaiting response

## 2016-07-21 NOTE — Progress Notes (Signed)
PROGRESS NOTE    Shawn Watkins  ZOX:[id number] DOB: September 13, 1980 DOA: 07/17/2016 PCP: Patient, No Pcp Per    Brief Narrative:  Patient is a 36 yo male admitted with severe alcoholic hepatitis and pancreatitis, started drinking several months ago after 2 years sobriety.  He drinks 12 to 26 beers per day.  GI consulted and is following.  He was started on Prednisone for alcoholic hepatitis.   His INR is normal.    Assessment & Plan:   Principal Problem:   Acute alcoholic hepatitis Active Problems:   Alcoholic pancreatitis   Acute gallstone pancreatitis   Transaminitis   Leukocytosis   Hyponatremia   ETOH abuse   Tobacco abuse   1. Alcoholic hepatitis and pancreatitis:  Continue with steroids.  Renew with higher dose of IV Ativan.  Continue with Thiamine, folate and MVI.   RUQ US showed no obstructive biliary tree.  2. DM, from pancreatitis:  Will start insulin coverages.  3. Hyponatremia:  Improving.  Suspect beer potomania.   DVT prophylaxis: SCD. Code Status: FULL CODE.  Family Communication: Patient only.  Disposition Plan: Home.   Consultants:   GI.   Procedures:   None.   Antimicrobials: Anti-infectives    None       Subjective:   Anxious, but otherwise OK.   Objective: Vitals:   07/20/16 1355 07/20/16 2056 07/21/16 0017 07/21/16 0611  BP: 117/76  113/66 132/90  Pulse: 84  81 82  Resp: 16  18 18   Temp: 99.1 F (37.3 C)  98.9 F (37.2 C) 98 F (36.7 C)  TempSrc: Oral  Oral Oral  SpO2: 97% 94% 95% 96%  Weight:      Height:        Intake/Output Summary (Last 24 hours) at 07/21/16 1301 Last data filed at 07/21/16 1231  Gross per 24 hour  Intake             2480 ml  Output                0 ml  Net             2480 ml   Filed Weights   07/18/16 0114  Weight: 83.9 kg (185 lb)    Examination:  General exam: Appears calm and comfortable  Respiratory system: Clear to auscultation. Respiratory effort normal. Cardiovascular system: S1 & S2  heard, RRR. No JVD, murmurs, rubs, gallops or clicks. No pedal edema. Gastrointestinal system: Abdomen is nondistended, soft and nontender. No organomegaly or masses felt. Normal bowel sounds heard. Central nervous system: Alert and oriented. No focal neurological deficits. Extremities: Symmetric 5 x 5 power. Skin: No rashes, lesions or ulcers Psychiatry: Judgement and insight appear normal. Mood & affect appropriate.   Data Reviewed: I have personally reviewed following labs and imaging studies  CBC:  Recent Labs Lab 07/17/16 1007 07/18/16 0631 07/20/16 0436  WBC 13.5* 7.2 5.9  NEUTROABS 12.3*  --   --   HGB 15.0 11.6* 10.2*  HCT 41.6 33.3* 29.8*  MCV 83.5 83.7 84.2  PLT 98* 70* 89*   Basic Metabolic Panel:  Recent Labs Lab 07/17/16 1007 07/18/16 0631 07/20/16 0436 07/21/16 0441  NA 126* 132* 129* 131*  K 4.1 4.3 4.4 4.3  CL 83* 95* 99* 99*  CO2 22 27 22 23   GLUCOSE 439* 332* 260* 295*  BUN 15 13 12 12   CREATININE 0.49* 0.32* <0.30* <0.30*  CALCIUM 7.2* 6.9* 7.6* 8.7*   GFR: CrCl cannot be  calculated (This lab value cannot be used to calculate CrCl because it is not a number: <0.30). Liver Function Tests:  Recent Labs Lab 07/17/16 1007 07/18/16 0631 07/19/16 0440 07/20/16 0436 07/21/16 0441  AST 367* 310* 231* 172* 155*  ALT 177* 151* 137* 137* 162*  ALKPHOS 675* 620* 660* 965* 1,280*  BILITOT 21.8* 21.9* 24.2* 26.4* 26.5*  PROT 6.5 5.8* 5.6* 5.7* 6.4*  ALBUMIN 3.1* 2.8* 2.5* 2.5* 2.7*    Recent Labs Lab 07/17/16 1007 07/19/16 0440  LIPASE 103* 45   Coagulation Profile:  Recent Labs Lab 07/17/16 1007 07/19/16 0440 07/20/16 0436 07/21/16 0441  INR 1.37 1.47 1.39 1.33   CBG:  Recent Labs Lab 07/21/16 1227  GLUCAP 298*   Lipid Profile: Radiology Studies: Mr Abdomen Mrcp Wo Contrast  Result Date: 07/20/2016 CLINICAL DATA:  Jaundice, diarrhea, upper abdominal pain x2 weeks, pancreatitis EXAM: MRI ABDOMEN WITHOUT CONTRAST  (INCLUDING  MRCP) TECHNIQUE: Multiplanar multisequence MR imaging of the abdomen was performed. Heavily T2-weighted images of the biliary and pancreatic ducts were obtained, and three-dimensional MRCP images were rendered by post processing. COMPARISON:  Right upper quadrant ultrasound dated 07/18/2016. CT abdomen/pelvis dated 07/17/2016. FINDINGS: Motion degraded images. Lower chest: Mild patchy right lower lobe opacity, likely atelectasis. Hepatobiliary: Moderate hepatic steatosis. No focal hepatic lesion is seen. Gallbladder is unremarkable.  No associated inflammatory changes. No intrahepatic or extrahepatic ductal dilatation. Common duct measures 3-4 mm. No choledocholithiasis is seen. Pancreas: Pancreas is poorly evaluated on this noncontrast, motion degraded study. Peripancreatic inflammatory changes, reflecting acute pancreatitis, with loss of the intrinsic T1 hyperintensity within the pancreatic head and proximal body. No peripancreatic fluid collection/pseudocyst. Possible focal dilatation of the pancreatic duct in the distal pancreatic tail (series 18/ image 25), although this is obscured by coarse parenchymal calcifications on CT. An underlying pancreatic lesion is difficult to entirely exclude in the pancreatic tail (series 18/ image 28), although equivocal. Spleen:  Within normal limits. Adrenals/Urinary Tract:  Adrenal glands are within normal limits. Kidneys are within normal limits.  No hydronephrosis. Stomach/Bowel: Stomach is within normal limits. Visualized bowel is unremarkable. Vascular/Lymphatic:  No evidence of abdominal aortic aneurysm. No suspicious abdominal lymphadenopathy. Other:  Mild abdominal stranding/ fluid. Musculoskeletal: No focal osseous lesions. IMPRESSION: Limited evaluation due to motion degradation and lack of intravenous contrast administration. No intrahepatic or extrahepatic ductal dilatation. Common duct measures 3-4 mm. No choledocholithiasis is seen. Acute pancreatitis.  No  peripancreatic fluid collection/pseudocyst. Possible focal dilatation the pancreatic duct in the distal pancreatic tail. Reason for satellite closely door in. An underlying pancreatic lesion is difficult to entirely exclude in the pancreatic tail given the limitations of the current study. Consider follow MRI abdomen with/without contrast in 4-6 weeks. Alternatively, consider EUS. Moderate hepatic steatosis. Electronically Signed   By: Charline Bills M.D.   On: 07/20/2016 11:58   Mr 3d Recon At Scanner  Result Date: 07/20/2016 CLINICAL DATA:  Jaundice, diarrhea, upper abdominal pain x2 weeks, pancreatitis EXAM: MRI ABDOMEN WITHOUT CONTRAST  (INCLUDING MRCP) TECHNIQUE: Multiplanar multisequence MR imaging of the abdomen was performed. Heavily T2-weighted images of the biliary and pancreatic ducts were obtained, and three-dimensional MRCP images were rendered by post processing. COMPARISON:  Right upper quadrant ultrasound dated 07/18/2016. CT abdomen/pelvis dated 07/17/2016. FINDINGS: Motion degraded images. Lower chest: Mild patchy right lower lobe opacity, likely atelectasis. Hepatobiliary: Moderate hepatic steatosis. No focal hepatic lesion is seen. Gallbladder is unremarkable.  No associated inflammatory changes. No intrahepatic or extrahepatic ductal dilatation. Common duct measures 3-4 mm.  No choledocholithiasis is seen. Pancreas: Pancreas is poorly evaluated on this noncontrast, motion degraded study. Peripancreatic inflammatory changes, reflecting acute pancreatitis, with loss of the intrinsic T1 hyperintensity within the pancreatic head and proximal body. No peripancreatic fluid collection/pseudocyst. Possible focal dilatation of the pancreatic duct in the distal pancreatic tail (series 18/ image 25), although this is obscured by coarse parenchymal calcifications on CT. An underlying pancreatic lesion is difficult to entirely exclude in the pancreatic tail (series 18/ image 28), although equivocal.  Spleen:  Within normal limits. Adrenals/Urinary Tract:  Adrenal glands are within normal limits. Kidneys are within normal limits.  No hydronephrosis. Stomach/Bowel: Stomach is within normal limits. Visualized bowel is unremarkable. Vascular/Lymphatic:  No evidence of abdominal aortic aneurysm. No suspicious abdominal lymphadenopathy. Other:  Mild abdominal stranding/ fluid. Musculoskeletal: No focal osseous lesions. IMPRESSION: Limited evaluation due to motion degradation and lack of intravenous contrast administration. No intrahepatic or extrahepatic ductal dilatation. Common duct measures 3-4 mm. No choledocholithiasis is seen. Acute pancreatitis.  No peripancreatic fluid collection/pseudocyst. Possible focal dilatation the pancreatic duct in the distal pancreatic tail. Reason for satellite closely door in. An underlying pancreatic lesion is difficult to entirely exclude in the pancreatic tail given the limitations of the current study. Consider follow MRI abdomen with/without contrast in 4-6 weeks. Alternatively, consider EUS. Moderate hepatic steatosis. Electronically Signed   By: Charline BillsSriyesh  Krishnan M.D.   On: 07/20/2016 11:58    Scheduled Meds: . folic acid  1 mg Oral Daily  . insulin aspart  0-15 Units Subcutaneous TID WC  . insulin aspart  0-5 Units Subcutaneous QHS  . multivitamin with minerals  1 tablet Oral Daily  . nicotine  21 mg Transdermal Daily  . pantoprazole (PROTONIX) IV  40 mg Intravenous Q24H  . prednisoLONE  30 mg Oral Daily  . thiamine  100 mg Oral Daily   Or  . thiamine  100 mg Intravenous Daily   Continuous Infusions: . sodium chloride 75 mL/hr at 07/21/16 1010     LOS: 4 days   Philippa Vessey, MD [facility] FacilityFACP Hospitalist.   If 7PM-7AM, please contact night-coverage [email or web address] Password [email or web address] 07/21/2016, 1:01 PM

## 2016-07-21 NOTE — Progress Notes (Addendum)
Patient ID: Shawn Watkins, male   DOB: 06/03/1980, 36 y.o.   MRN: [id number] Feels better. Tolerating diet. No complaints this morning. BM x 2 yesterday. C/o cough.  CMP Latest Ref Rng & Units 07/21/2016 07/20/2016 07/19/2016  Glucose 65 - 99 mg/dL 782(N295(H) 562(Z260(H) -  BUN 6 - 20 mg/dL 12 12 -  Creatinine 3.080.61 - 1.24 mg/dL <6.57(Q<0.30(L) <4.69(G<0.30(L) -  Sodium 135 - 145 mmol/L 131(L) 129(L) -  Potassium 3.5 - 5.1 mmol/L 4.3 4.4 -  Chloride 101 - 111 mmol/L 99(L) 99(L) -  CO2 22 - 32 mmol/L 23 22 -  Calcium 8.9 - 10.3 mg/dL 2.9(B8.7(L) 7.6(L) -  Total Protein 6.5 - 8.1 g/dL 6.4(L) 5.7(L) 5.6(L)  Total Bilirubin 0.3 - 1.2 mg/dL 26.5(HH) 26.4(HH) 24.2(HH)  Alkaline Phos 38 - 126 U/L 1,280(H) 965(H) 660(H)  AST 15 - 41 U/L 155(H) 172(H) 231(H)  ALT 17 - 63 U/L 162(H) 137(H) 137(H)   Alcoholic Hepatitis. Will continue to monitor.   GI attending note; As above he feels better and denies abdominal pain nausea or vomiting. Bilirubin has leveled off. AP is very high.  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 Will look for other reasons if cholestasis does not improve. Will advance diet to heart healthy diet. LFTs in a.m.

## 2016-07-22 LAB — GLUCOSE, CAPILLARY
GLUCOSE-CAPILLARY: 285 mg/dL — AB (ref 65–99)
Glucose-Capillary: 235 mg/dL — ABNORMAL HIGH (ref 65–99)

## 2016-07-22 LAB — HEMOGLOBIN A1C
Hgb A1c MFr Bld: 8.4 % — ABNORMAL HIGH (ref 4.8–5.6)
Mean Plasma Glucose: 194 mg/dL

## 2016-07-22 LAB — HEPATIC FUNCTION PANEL
ALBUMIN: 2.4 g/dL — AB (ref 3.5–5.0)
ALT: 166 U/L — AB (ref 17–63)
AST: 150 U/L — AB (ref 15–41)
Alkaline Phosphatase: 1267 U/L — ABNORMAL HIGH (ref 38–126)
BILIRUBIN DIRECT: 11.5 mg/dL — AB (ref 0.1–0.5)
BILIRUBIN TOTAL: 19 mg/dL — AB (ref 0.3–1.2)
Indirect Bilirubin: 7.5 mg/dL — ABNORMAL HIGH (ref 0.3–0.9)
Total Protein: 6.1 g/dL — ABNORMAL LOW (ref 6.5–8.1)

## 2016-07-22 MED ORDER — PREDNISOLONE 5 MG PO TABS: 30.0000 mg | ORAL_TABLET | Freq: Every day | ORAL | 0 refills | Status: DC

## 2016-07-22 MED ORDER — GUAIFENESIN-DM 100-10 MG/5ML PO SYRP
5.0000 mL | ORAL_SOLUTION | ORAL | Status: DC | PRN
  Administered 2016-07-22: 5 mL via ORAL
  Filled 2016-07-22 (×2): qty 5

## 2016-07-22 MED ORDER — THIAMINE HCL 100 MG PO TABS: 100.0000 mg | ORAL_TABLET | Freq: Every day | ORAL | 0 refills | Status: DC

## 2016-07-22 MED ORDER — NICOTINE 21 MG/24HR TD PT24: 21.0000 mg | MEDICATED_PATCH | Freq: Every day | TRANSDERMAL | 0 refills | Status: DC

## 2016-07-22 MED ORDER — LIVING WELL WITH DIABETES BOOK
Freq: Once | Status: AC
  Administered 2016-07-22: 14:00:00
  Filled 2016-07-22: qty 1

## 2016-07-22 NOTE — Clinical Social Work Note (Addendum)
Patient states that at this time he would like to find a Local AA meeting to attend. He is not currently interested in going to formal inpatient or out patient treatment as he has completed extensive programs multiple times. He did not complete SBIRT.  Patient is aware that his alcohol is an issue but he states that he need to focus on his physical health issues at this time as he has had a lot to "come at me" about his physical health.   He reports that he has been sober in the past for two years.  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  LCSW provided treatment resources as well as multiple local AA meeting locations and schedules.    LCSW signing off.   Lakia Gritton, Juleen ChinaHeather D, LCSW

## 2016-07-22 NOTE — Progress Notes (Addendum)
Patient ID: Shawn Watkins, male   DOB: 1980-10-05, 36 y.o.   MRN: [id number] Voices no complaints. Appetite is good. Bilirubin  decreasing.  Blood pressure 134/89, pulse 91, temperature 98 F (36.7 C), temperature source Oral, resp. rate 20, height 6\' 1"  (1.854 m), weight 185 lb (83.9 kg), SpO2 99 %. CMP     Component Value Date/Time   NA 131 (L) 07/21/2016 0441   K 4.3 07/21/2016 0441   CL 99 (L) 07/21/2016 0441   CO2 23 07/21/2016 0441   GLUCOSE 295 (H) 07/21/2016 0441   BUN 12 07/21/2016 0441   CREATININE <0.30 (L) 07/21/2016 0441   CALCIUM 8.7 (L) 07/21/2016 0441   PROT 6.1 (L) 07/22/2016 0507   ALBUMIN 2.4 (L) 07/22/2016 0507   AST 150 (H) 07/22/2016 0507   ALT 166 (H) 07/22/2016 0507   ALKPHOS 1,267 (H) 07/22/2016 0507   BILITOT 19.0 (HH) 07/22/2016 0507   GFRNONAA NOT CALCULATED 07/21/2016 0441   GFRAA NOT CALCULATED 07/21/2016 0441  Assessment: Alcoholic hepatitis. Liver enzymes are decreasing.  Plan: OV in 2 weeks with a CMET.   GI attending note: Patient's condition discussed with his mother. It is absolutely essential that he abstain from drinking alcohol otherwise he will be right back and evening more dire situation. Repeat returning to her office in about 2 weeks. Prednisolone taper can start on 07/26/2016. Drop Dose by 5 mg every week.

## 2016-07-22 NOTE — Plan of Care (Signed)
Problem: Food- and Nutrition-Related Knowledge Deficit (NB-1.1) Goal: Nutrition education Formal process to instruct or train a patient/client in a skill or to impart knowledge to help patients/clients voluntarily manage or modify food choices and eating behavior to maintain or improve health. Outcome: Progressing  RD consulted for nutrition education regarding diabetes, however, spoke with MD and education focus shifted more towards pancreatitis.   RD provided "Type 2 Diabetes Nutrition Therapy", "Pancreatitis Nutrition Therapy" and "Fat restricted Nutrition therapy" handouts from the Academy of Nutrition and Dietetics.   Went through dietary Recall; Breakfast: Omlet, Wheat toast, coffee Lunch: Sandwiches, either made at home or out at a [employer] food restaurant like [employer] or [employer] (chicken sandwich) Dinner: Geologist, engineeringLean protein and vegetable steamer bag Bevs: Water or 0 kcal powerade. Says he is done with etoh Other: Says he was running 5-10 miles a day.   Honestly, the pt's diet, with the exclusion of the alcohol, is superb. He eat leans meats, vegetables at L/D and 0 kcal beverages. He is reportedly extremely active and was a athlete in school. He says up until the pancreatitis, he was ruuning runs 5-10 miles a day. He eats mostly whole grains.   We discussed minimizing the amount of fat/sugar he consumes while his body is recovering.   Recommended limiting fat to roughly 55-65g/day. He eats eggs and nuts frequently. He should eat egg whites right no and avoid high fat nuts like peanuts. He should avoid whole milk. He eats cottage cheese frequently and should now choose low fat. He should use minimal butter/cream, half/half etc. Should prioritize lean meats (already doing). He should not eat the skin and not fry his food in oil. Should choose products that are labeled low fat. Sample foods are given.   As far as carbs/sugar, he is already doing well in this area. He does not eat any high carb foods.  Simply recommended continuing to drink 0 kcal bevs, eating vegetables at each meal, exercising as medically able, and making all of his grains, whole grains. All of these habits should lead to good glycemic control.   Summary of Recommendations 1. AVOID/ELIMINATE ETOH 2. 55-65 g Fat/day 3. Lean meats, high veg, low fat dairy 4. Avoid nuts, egg yolks, fatty fish, lunch meats 5. Bake, dont fry foods.   Expect Good compliance. 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 Expect he should be able to do this again, especially with strong support from his family.   Body mass index is 24.41 kg/m. Pt meets criteria for healthy wt for ht based on current BMI.  Patient is discharging today. No further nutrition interventions warranted at this time.   Christophe LouisNathan Jamiracle Avants RD, LDN, CNSC Clinical Nutrition Pager: [phone] 07/22/2016 1:27 PM

## 2016-07-22 NOTE — Care Management Note (Signed)
Case Management Note  Patient Details  Name: Shawn Watkins MRN: [id number] Date of Birth: [birth year]  Subjective/Objective:                  Pt admitted with ETOH hepatitis. He is from home, living with his mother. He is ind with ADL's. He is listed has having no insurance but says he has medicaid. Pt has seen Artistfinancial counselor. Pt givne list of PCP's that will see him with and without insurance along with community resources for further management of his medical conditions. Pt states he will be traveling back and forth between [place] and [place].   Action/Plan: No further needs at this time.   Expected Discharge Date:  07/22/16               Expected Discharge Plan:  Home/Self Care  In-House Referral:  Clinical Social Work  Discharge planning Services  CM Consult  Post Acute Care Choice:  NA Choice offered to:  NA  Status of Service:  Completed, signed off  Malcolm MetroChildress, Medina Degraffenreid Demske, RN 07/22/2016, 1:40 PM

## 2016-07-22 NOTE — Progress Notes (Addendum)
Inpatient Diabetes Program Recommendations  AACE/ADA: New Consensus Statement on Inpatient Glycemic Control (2015)  Target Ranges:  Prepandial:   less than 140 mg/dL      Peak postprandial:   less than 180 mg/dL (1-2 hours)      Critically ill patients:  140 - 180 mg/dL   Results for Shawn Watkins, Shawn Watkins (MRN [id number]) as of 07/22/2016 14:42  Ref. Range 07/21/2016 12:27 07/21/2016 16:22 07/21/2016 21:16 07/22/2016 07:46 07/22/2016 11:43  Glucose-Capillary Latest Ref Range: 65 - 99 mg/dL 440298 (H) 102249 (H) 725284 (H) 235 (H) 285 (H)    Review of Glycemic Control  Diabetes history: No Outpatient Diabetes medications: NA Current orders for Inpatient glycemic control: Novolog 0-15 units TID with meals, Novolog 0-5 units QHS  Inpatient Diabetes Program Recommendations: HgbA1C: A1C 8.4% on 07/21/16 indicating an average glucose of 194 mg/dl over the past 2-3 months. 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  NOTE: Spoke with patient about new diabetes diagnosis. Patient reports that he has never been told he had DM or prediabetes in the past. Patient just moved to the area from [place] about 2 weeks ago. Patient reports that he use to drink and stopped for 2 years and then just started drinking heavily again about 6 months ago. Patient states that the doctor has told him he has diabetes but he should be able to keep it controlled with lifestyle modifications (stop drinking, diet, exercise) and he will not be prescribed any DM medication for discharge.  Discussed A1C results (8.4% on 07/21/16) and explained what an A1C is and informed patient that his current A1C indicates an average glucose of 197 mg/dl over the past 2-3 months. Discussed basic pathophysiology of DM Type 2, basic home care, importance of checking CBGs and maintaining good CBG control to prevent long-term and short-term complications. Reviewed glucose and A1C goals. Discussed how ETOH and pancreatitis could  impact glycemic control.  Reviewed signs and symptoms of hyperglycemia and hypoglycemia along with treatment for both. Discussed impact of nutrition, exercise, stress, sickness, and medications on diabetes control. Explained to patient that he has been given steroids here at the hospital which is contributing to hyperglycemia.  Reviewed Living Well with diabetes booklet and encouraged patient to read through entire book.  Asked patient to check his glucose as directed by MD but at least once a day and to keep a log book of glucose readings.  Explained how the doctor he follows up with can use the log book to determine if glucose is going to be controlled with lifestyle modifications or if he will need DM medication prescribed.  Patient verbalized understanding of information discussed and he states that he has no further questions at this time related to diabetes. Patient and his mother both report they are feeling overwhelmed with everything going on and all the information they have been provided with. Provided emotional support and encouraged them to read the Living Well with Diabetes book to reinforce the information discussed.     Thanks, Orlando PennerMarie Tranesha Lessner, RN, MSN, CDE Diabetes Coordinator Inpatient Diabetes Program [phone] (Team Pager from 8am to 5pm)

## 2016-07-22 NOTE — Care Management Note (Signed)
Case Management Note  Patient Details  Name: Mauri Readingimothy J Palencia MRN: [id number] Date of Birth: [birth year]  If discussed at Long Length of Stay Meetings, dates discussed:  07/22/2016  Malcolm MetroChildress, Choua Ikner Demske, RN 07/22/2016, 1:44 PM

## 2016-07-22 NOTE — Discharge Summary (Signed)
Physician Discharge Summary  Shawn Watkins Shawn Watkins DOB: 11-05-1980 DOA: 07/17/2016  PCP: Shawn Watkins  Admit date: 07/17/2016 Discharge date: 07/22/2016  Admitted From: home.  Disposition: Home.   Recommendations for Outpatient Follow-up:  1. Follow up with PCP in 1-2 weeks 2. Please obtain BMP/CBC in one week 3. Please follow up on the following pending results:  Home Health: None.  Equipment/Devices: Home.  Discharge Condition:Improved.  CODE STATUS: FULL CODE>  Diet recommendation: As tolerated.   Brief/Interim Summary:  Patient was admitted into the hospital on July 18, 3714 for alcoholic hepatitis and pancreatitis by Dr Jerilee Hoh. As Watkins her H and P:  " Shawn Watkins is a 36 y.o. male with h/o alcoholism and tobacco abuse presents today with 3 days of RUQ abdominal pain with nausea and vomiting. His mother noticed his skin was yellow 2 days ago. He was sober for 2 years, but then started drinking again 6 months ago. In the ED he is noticed to be jaundiced, labs: ETOH 109, AP 675, AST 367, ALT 177, Bili 21.8. CT scan abdomen: findings most compatible with acute on chronic pancreatitis. Admission requested.  HOSPITAL COURSE:  Patient was admitted into the hospital for severe pancreatitis and hepatitis due to alcohol.  His discriminant factor was 46, and he was started on steroids.  His total bili stayed up over 20 for several days, then came down, as all his LFTs.  His alk phosphtase was elevated as well.  GI was consulted, and Dr Dereck Leep saw him and guided therapy.  He was placed on CIWA protocol, and did well.  He no longer had signs of withdrawal.  His Lipase came down to normal level as well, and he was able to tolerate food.  His RUQ US showed no obtructive disease, as well as his MRCP.  It was recommended that MRI be repeated with and without contrast in 4-6 weeks, and we will defer that to GI.  He must stop drinking indefinitely, and he was impressed on its importance. He  will follow up with Dr Dereck Leep in a couple of weeks, and I have referred him to Dr Lorriane Shire.  He will avoid tylenol, and if taking NSAIDS, only with food.  He was given Thiamine and folate in the hospital, and will continue. Dr Dereck Leep recommneded decreasing Prednisone by '5mg'$  Watkins week, starting at current dose of '30mg'$  , until he sees him. Thank you for allowing me to participate in his care.  Good Day.   Discharge Diagnoses:  Principal Problem:   Acute alcoholic hepatitis Active Problems:   Alcoholic pancreatitis   Acute gallstone pancreatitis   Transaminitis   Leukocytosis   Hyponatremia   ETOH abuse   Tobacco abuse    Discharge Instructions  Discharge Instructions    Diet - low sodium heart healthy    Complete by:  As directed    Discharge instructions    Complete by:  As directed    YOU MUST NOT DRINK ALCOHOL, EVER AGAIN.    Follow up with Dr Dereck Leep in 2 weeks.   You have been referred to Dr Cindie Laroche for your primary care need.   Increase activity slowly    Complete by:  As directed      Allergies as of 07/22/2016   No Known Allergies     Medication List    STOP taking these medications   acetaminophen 325 MG tablet Commonly known as:  TYLENOL   chlordiazePOXIDE 25 MG capsule Commonly  known as:  LIBRIUM     TAKE these medications   folic acid 1 MG tablet Commonly known as:  FOLVITE Take 1 tablet by mouth daily.   multivitamin capsule Take 1 capsule by mouth daily.   nicotine 21 mg/24hr patch Commonly known as:  NICODERM CQ - dosed in mg/24 hours Place 1 patch (21 mg total) onto the skin daily. Start taking on:  07/23/2016   prednisoLONE 5 MG Tabs tablet Take 6 tablets (30 mg total) by mouth daily. Decrease by '5mg'$  Watkins day each week as instructed by me. Start taking on:  07/23/2016   thiamine 100 MG tablet Take 1 tablet (100 mg total) by mouth daily. Start taking on:  07/23/2016       No Known Allergies  Consultations:  GI.    Procedures/Studies: Ct  Abdomen Pelvis W Contrast  Addendum Date: 07/17/2016   ADDENDUM REPORT: 07/17/2016 14:49 ADDENDUM: Addendum for addition of IMPRESSION: 3 mm left lower lobe pulmonary nodule. No follow-up needed if patient is low-risk. Non-contrast chest CT can be considered in 12 months if patient is high-risk. This recommendation follows the consensus statement: Guidelines for Management of Incidental Pulmonary Nodules Detected on CT Images: From the Fleischner Society 2017; Radiology 2017; 284:228-243. Electronically Signed   By: Lovey Newcomer M.D.   On: 07/17/2016 14:49   Result Date: 07/17/2016 CLINICAL DATA:  Patient with sharp abdominal pain and loss of appetite. EXAM: CT ABDOMEN AND PELVIS WITH CONTRAST TECHNIQUE: Multidetector CT imaging of the abdomen and pelvis was performed using the standard protocol following bolus administration of intravenous contrast. CONTRAST:  144m ISOVUE-300 IOPAMIDOL (ISOVUE-300) INJECTION 61% COMPARISON:  None. FINDINGS: Lower chest: Normal heart size. Subpleural ground-glass and consolidative opacities within the right lower lobe. Within the left lower lobe there is a 3 mm nodule (image 10; series 4). Small right pleural effusion. Hepatobiliary: Liver is diffusely low in attenuation compatible with marked steatosis. Gallbladder is unremarkable. No intrahepatic or extrahepatic biliary ductal dilatation. Pancreas: The pancreatic head and proximal body are edematous with surrounding fat stranding. There is calcification throughout the distal body and tail of the pancreas. Spleen: Unremarkable Adrenals/Urinary Tract: The adrenal glands are normal. Kidneys enhance symmetrically with contrast. No hydronephrosis. Urinary bladder is markedly distended. Stomach/Bowel: There is wall thickening of the second and third portion of the duodenum. No evidence for bowel obstruction. Mild wall thickening of the proximal transverse colon and rectum. Vascular/Lymphatic: Normal caliber abdominal aorta.  Subcentimeter prominent retroperitoneal lymph nodes. Nonocclusive filling defect demonstrated within the central portal vein (image 53; series 5). Prominent and enlarged porta hepatic lymph nodes measuring up to 1.6 cm (image 38; series 2). Reproductive: Prostate unremarkable. Other: Small bilateral fat containing inguinal hernias. Musculoskeletal: No aggressive or acute appearing osseous lesions. IMPRESSION: Findings most compatible with acute on chronic pancreatitis. The pancreatic head/ uncinate process and proximal body all are enlarged and edematous. There is free fluid tracking within the central abdomen and along the right pericolic gutter. Suggestion of a nonocclusive filling defect within the portal vein/proximal SMV raising the possibility of peripheral nonocclusive thrombosis. Mild wall thickening of the proximal transverse colon and rectum likely reactive in etiology. Colitis is not excluded. Reactive wall thickening of the duodenum. Distention of the urinary bladder. Hepatic steatosis. Small right pleural effusion. Opacities within the dependent right lower lobe may represent atelectasis or infection. Electronically Signed: By: DLovey NewcomerM.D. On: 07/17/2016 13:41   Mr Abdomen Mrcp Wo Contrast  Result Date: 07/20/2016 CLINICAL DATA:  Jaundice,  diarrhea, upper abdominal pain x2 weeks, pancreatitis EXAM: MRI ABDOMEN WITHOUT CONTRAST  (INCLUDING MRCP) TECHNIQUE: Multiplanar multisequence MR imaging of the abdomen was performed. Heavily T2-weighted images of the biliary and pancreatic ducts were obtained, and three-dimensional MRCP images were rendered by post processing. COMPARISON:  Right upper quadrant ultrasound dated 07/18/2016. CT abdomen/pelvis dated 07/17/2016. FINDINGS: Motion degraded images. Lower chest: Mild patchy right lower lobe opacity, likely atelectasis. Hepatobiliary: Moderate hepatic steatosis. No focal hepatic lesion is seen. Gallbladder is unremarkable.  No associated inflammatory  changes. No intrahepatic or extrahepatic ductal dilatation. Common duct measures 3-4 mm. No choledocholithiasis is seen. Pancreas: Pancreas is poorly evaluated on this noncontrast, motion degraded study. Peripancreatic inflammatory changes, reflecting acute pancreatitis, with loss of the intrinsic T1 hyperintensity within the pancreatic head and proximal body. No peripancreatic fluid collection/pseudocyst. Possible focal dilatation of the pancreatic duct in the distal pancreatic tail (series 18/ image 25), although this is obscured by coarse parenchymal calcifications on CT. An underlying pancreatic lesion is difficult to entirely exclude in the pancreatic tail (series 18/ image 28), although equivocal. Spleen:  Within normal limits. Adrenals/Urinary Tract:  Adrenal glands are within normal limits. Kidneys are within normal limits.  No hydronephrosis. Stomach/Bowel: Stomach is within normal limits. Visualized bowel is unremarkable. Vascular/Lymphatic:  No evidence of abdominal aortic aneurysm. No suspicious abdominal lymphadenopathy. Other:  Mild abdominal stranding/ fluid. Musculoskeletal: No focal osseous lesions. IMPRESSION: Limited evaluation due to motion degradation and lack of intravenous contrast administration. No intrahepatic or extrahepatic ductal dilatation. Common duct measures 3-4 mm. No choledocholithiasis is seen. Acute pancreatitis.  No peripancreatic fluid collection/pseudocyst. Possible focal dilatation the pancreatic duct in the distal pancreatic tail. Reason for satellite closely door in. An underlying pancreatic lesion is difficult to entirely exclude in the pancreatic tail given the limitations of the current study. Consider follow MRI abdomen with/without contrast in 4-6 weeks. Alternatively, consider EUS. Moderate hepatic steatosis. Electronically Signed   By: Charline Bills M.D.   On: 07/20/2016 11:58   Mr 3d Recon At Scanner  Result Date: 07/20/2016 CLINICAL DATA:  Jaundice,  diarrhea, upper abdominal pain x2 weeks, pancreatitis EXAM: MRI ABDOMEN WITHOUT CONTRAST  (INCLUDING MRCP) TECHNIQUE: Multiplanar multisequence MR imaging of the abdomen was performed. Heavily T2-weighted images of the biliary and pancreatic ducts were obtained, and three-dimensional MRCP images were rendered by post processing. COMPARISON:  Right upper quadrant ultrasound dated 07/18/2016. CT abdomen/pelvis dated 07/17/2016. FINDINGS: Motion degraded images. Lower chest: Mild patchy right lower lobe opacity, likely atelectasis. Hepatobiliary: Moderate hepatic steatosis. No focal hepatic lesion is seen. Gallbladder is unremarkable.  No associated inflammatory changes. No intrahepatic or extrahepatic ductal dilatation. Common duct measures 3-4 mm. No choledocholithiasis is seen. Pancreas: Pancreas is poorly evaluated on this noncontrast, motion degraded study. Peripancreatic inflammatory changes, reflecting acute pancreatitis, with loss of the intrinsic T1 hyperintensity within the pancreatic head and proximal body. No peripancreatic fluid collection/pseudocyst. Possible focal dilatation of the pancreatic duct in the distal pancreatic tail (series 18/ image 25), although this is obscured by coarse parenchymal calcifications on CT. An underlying pancreatic lesion is difficult to entirely exclude in the pancreatic tail (series 18/ image 28), although equivocal. Spleen:  Within normal limits. Adrenals/Urinary Tract:  Adrenal glands are within normal limits. Kidneys are within normal limits.  No hydronephrosis. Stomach/Bowel: Stomach is within normal limits. Visualized bowel is unremarkable. Vascular/Lymphatic:  No evidence of abdominal aortic aneurysm. No suspicious abdominal lymphadenopathy. Other:  Mild abdominal stranding/ fluid. Musculoskeletal: No focal osseous lesions. IMPRESSION: Limited evaluation due  to motion degradation and lack of intravenous contrast administration. No intrahepatic or extrahepatic ductal  dilatation. Common duct measures 3-4 mm. No choledocholithiasis is seen. Acute pancreatitis.  No peripancreatic fluid collection/pseudocyst. Possible focal dilatation the pancreatic duct in the distal pancreatic tail. Reason for satellite closely door in. An underlying pancreatic lesion is difficult to entirely exclude in the pancreatic tail given the limitations of the current study. Consider follow MRI abdomen with/without contrast in 4-6 weeks. Alternatively, consider EUS. Moderate hepatic steatosis. Electronically Signed   By: Julian Hy M.D.   On: 07/20/2016 11:58   US Abdomen Limited Ruq  Result Date: 07/18/2016 CLINICAL DATA:  36 year old male with elevated LFTs and pancreatitis. EXAM: US ABDOMEN LIMITED - RIGHT UPPER QUADRANT COMPARISON:  07/17/2016 CT FINDINGS: Gallbladder: There is no evidence of cholelithiasis, gallbladder wall thickening or sonographic Murphy's sign. A small amount of pericholecystic fluid is noted. Common bile duct: Diameter: 2.3 mm. There is no evidence of intrahepatic or extrahepatic biliary dilatation. Liver: Diffusely increased echogenicity noted likely representing hepatic steatosis. No focal hepatic abnormalities are present. IMPRESSION: Increased hepatic echogenicity likely representing hepatic steatosis. No focal hepatic abnormality. No evidence of cholelithiasis or acute cholecystitis. No biliary dilatation. Electronically Signed   By: Margarette Canada M.D.   On: 07/18/2016 10:10       Subjective:  I am OK.    Discharge Exam: Vitals:   07/21/16 2113 07/22/16 0502  BP: 126/90 134/89  Pulse: 99 91  Resp: 20 20  Temp: 98.6 F (37 C) 98 F (36.7 C)   Vitals:   07/21/16 1300 07/21/16 2113 07/21/16 2141 07/22/16 0502  BP: 138/89 126/90  134/89  Pulse: 91 99  91  Resp: '20 20  20  '$ Temp: 98.5 F (36.9 C) 98.6 F (37 C)  98 F (36.7 C)  TempSrc: Oral Oral  Oral  SpO2: 99% 98% 93% 99%  Weight:      Height:        General: Pt is alert, awake, not in  acute distress Cardiovascular: RRR, S1/S2 +, no rubs, no gallops Respiratory: CTA bilaterally, no wheezing, no rhonchi Abdominal: Soft, NT, ND, bowel sounds + Extremities: no edema, no cyanosis    The results of significant diagnostics from this hospitalization (including imaging, microbiology, ancillary and laboratory) are listed below for reference.    Basic Metabolic Panel:  Recent Labs Lab 07/17/16 1007 07/18/16 0631 07/20/16 0436 07/21/16 0441  NA 126* 132* 129* 131*  K 4.1 4.3 4.4 4.3  CL 83* 95* 99* 99*  CO2 '22 27 22 23  '$ GLUCOSE 439* 332* 260* 295*  BUN '15 13 12 12  '$ CREATININE 0.49* 0.32* <0.30* <0.30*  CALCIUM 7.2* 6.9* 7.6* 8.7*   Liver Function Tests:  Recent Labs Lab 07/18/16 0631 07/19/16 0440 07/20/16 0436 07/21/16 0441 07/22/16 0507  AST 310* 231* 172* 155* 150*  ALT 151* 137* 137* 162* 166*  ALKPHOS 620* 660* 965* 1,280* 1,267*  BILITOT 21.9* 24.2* 26.4* 26.5* 19.0*  PROT 5.8* 5.6* 5.7* 6.4* 6.1*  ALBUMIN 2.8* 2.5* 2.5* 2.7* 2.4*    Recent Labs Lab 07/17/16 1007 07/19/16 0440  LIPASE 103* 45   CBC:  Recent Labs Lab 07/17/16 1007 07/18/16 0631 07/20/16 0436  WBC 13.5* 7.2 5.9  NEUTROABS 12.3*  --   --   HGB 15.0 11.6* 10.2*  HCT 41.6 33.3* 29.8*  MCV 83.5 83.7 84.2  PLT 98* 70* 89*   CBG:  Recent Labs Lab 07/21/16 1227 07/21/16 1622 07/21/16 2116 07/22/16 0746  07/22/16 1143  GLUCAP 298* 249* 284* 235* 285*   Hgb A1c  Recent Labs  07/21/16 0441  HGBA1C 8.4*   Urinalysis    Component Value Date/Time   COLORURINE YELLOW 06/06/2010 0949   APPEARANCEUR CLEAR 06/06/2010 0949   LABSPEC 1.022 06/06/2010 0949   PHURINE 6.0 06/06/2010 0949   GLUCOSEU NEGATIVE 06/06/2010 0949   HGBUR NEGATIVE 06/06/2010 0949   BILIRUBINUR NEGATIVE 06/06/2010 0949   KETONESUR NEGATIVE 06/06/2010 0949   PROTEINUR 30 (A) 06/06/2010 0949   UROBILINOGEN 1.0 06/06/2010 0949   NITRITE NEGATIVE 06/06/2010 0949   LEUKOCYTESUR NEGATIVE  06/06/2010 0949    Time coordinating discharge: Over 30 minutes SIGNED:  Orvan Falconer, MD FACP [facility] 07/22/2016, 1:28 PM   If 7PM-7AM, please contact night-coverage [email or web address] Password [email or web address]

## 2016-07-22 NOTE — Progress Notes (Signed)
Reviewed A1C level with pt and pt's mother.  Diabetes education provided & reviewed with teach back performed.  Following our discussion, diabetes educator to bedside.  Living well with diabetes book provided and discussed via educator.

## 2016-07-22 NOTE — Progress Notes (Signed)
All discharge instructions reviewed in detail with pt and pt's mother with understanding verbalized by both. VSS.  No questions or concerns prior to discharge.  Pt discharged to home with mother in stable condition.

## 2016-07-27 ENCOUNTER — Emergency Department (HOSPITAL_COMMUNITY)
Admission: EM | Admit: 2016-07-27 | Discharge: 2016-07-27 | Disposition: A | Discharge status: Home / Self Care | Payer: Self-pay | Attending: Emergency Medicine | Admitting: Emergency Medicine

## 2016-07-27 ENCOUNTER — Encounter (HOSPITAL_COMMUNITY): Payer: Self-pay | Admitting: Emergency Medicine

## 2016-07-27 ENCOUNTER — Emergency Department (HOSPITAL_COMMUNITY): Payer: Self-pay

## 2016-07-27 DIAGNOSIS — F17210 Nicotine dependence, cigarettes, uncomplicated: Secondary | ICD-10-CM | POA: Insufficient documentation

## 2016-07-27 DIAGNOSIS — E871 Hypo-osmolality and hyponatremia: Secondary | ICD-10-CM | POA: Insufficient documentation

## 2016-07-27 DIAGNOSIS — R945 Abnormal results of liver function studies: Secondary | ICD-10-CM | POA: Insufficient documentation

## 2016-07-27 DIAGNOSIS — R52 Pain, unspecified: Secondary | ICD-10-CM

## 2016-07-27 DIAGNOSIS — R739 Hyperglycemia, unspecified: Secondary | ICD-10-CM | POA: Insufficient documentation

## 2016-07-27 DIAGNOSIS — Z79899 Other long term (current) drug therapy: Secondary | ICD-10-CM | POA: Insufficient documentation

## 2016-07-27 DIAGNOSIS — R0789 Other chest pain: Secondary | ICD-10-CM | POA: Insufficient documentation

## 2016-07-27 HISTORY — DX: Acute pancreatitis without necrosis or infection, unspecified: K85.90

## 2016-07-27 LAB — CBC WITH DIFFERENTIAL/PLATELET
Basophils Absolute: 0.1 10*3/uL (ref 0.0–0.1)
Basophils Relative: 1 %
EOS ABS: 0 10*3/uL (ref 0.0–0.7)
EOS PCT: 0 %
HCT: 29.9 % — ABNORMAL LOW (ref 39.0–52.0)
Hemoglobin: 9.5 g/dL — ABNORMAL LOW (ref 13.0–17.0)
LYMPHS ABS: 1.6 10*3/uL (ref 0.7–4.0)
Lymphocytes Relative: 10 %
MCH: 29.1 pg (ref 26.0–34.0)
MCHC: 31.8 g/dL (ref 30.0–36.0)
MCV: 91.7 fL (ref 78.0–100.0)
Monocytes Absolute: 0.7 10*3/uL (ref 0.1–1.0)
Monocytes Relative: 4 %
NEUTROS PCT: 85 %
Neutro Abs: 13.3 10*3/uL — ABNORMAL HIGH (ref 1.7–7.7)
PLATELETS: 658 10*3/uL — AB (ref 150–400)
RBC: 3.26 MIL/uL — AB (ref 4.22–5.81)
RDW: 14.5 % (ref 11.5–15.5)
WBC: 15.7 10*3/uL — AB (ref 4.0–10.5)

## 2016-07-27 LAB — COMPREHENSIVE METABOLIC PANEL
ALBUMIN: 2.8 g/dL — AB (ref 3.5–5.0)
ALT: 190 U/L — ABNORMAL HIGH (ref 17–63)
ANION GAP: 11 (ref 5–15)
AST: 128 U/L — ABNORMAL HIGH (ref 15–41)
Alkaline Phosphatase: 992 U/L — ABNORMAL HIGH (ref 38–126)
BUN: 6 mg/dL (ref 6–20)
CALCIUM: 8.2 mg/dL — AB (ref 8.9–10.3)
CO2: 26 mmol/L (ref 22–32)
Chloride: 92 mmol/L — ABNORMAL LOW (ref 101–111)
Creatinine, Ser: 0.61 mg/dL (ref 0.61–1.24)
GFR calc non Af Amer: >60 mL/min (ref >60)
GLUCOSE: 301 mg/dL — AB (ref 65–99)
POTASSIUM: 3.7 mmol/L (ref 3.5–5.1)
SODIUM: 129 mmol/L — AB (ref 135–145)
Total Bilirubin: 7.9 mg/dL — ABNORMAL HIGH (ref 0.3–1.2)
Total Protein: 6.6 g/dL (ref 6.5–8.1)

## 2016-07-27 LAB — D-DIMER, QUANTITATIVE: D-Dimer, Quant: 2.89 ug/mL-FEU — ABNORMAL HIGH (ref 0.00–0.50)

## 2016-07-27 LAB — POC OCCULT BLOOD, ED: FECAL OCCULT BLD: NEGATIVE

## 2016-07-27 LAB — TROPONIN I
TROPONIN I: 0.07 ng/mL — AB (ref <0.03)
TROPONIN I: 0.07 ng/mL — AB (ref <0.03)

## 2016-07-27 LAB — LIPASE, BLOOD: Lipase: 44 U/L (ref 11–51)

## 2016-07-27 MED ORDER — IOPAMIDOL (ISOVUE-370) INJECTION 76%
100.0000 mL | Freq: Once | INTRAVENOUS | Status: AC | PRN
  Administered 2016-07-27: 100 mL via INTRAVENOUS

## 2016-07-27 NOTE — ED Notes (Signed)
Date and time results received: 07/27/16 11:52 AM (use smartphrase ".now" to insert current time)  Test: Troponin Critical Value: 0.07  Name of Provider Notified: Jacobowitz  Orders Received? Or Actions Taken?: Orders Received - See Orders for details

## 2016-07-27 NOTE — Discharge Instructions (Signed)
An [facility] [place] for Tuesday, 08/03/2016 at 1:20 PM. Ask the doctor to help you to stop smoking. It is okay to take the antacid that you've been taking as directed for discomfort. Your blood sugar today was mildly elevated at 301. You are mildly anemic. Your hemoglobin today is 9.5. Make sure that you schedule an appointment with the physician whose name you were given to be seen within the next 2 weeks. Return if concern for any reason

## 2016-07-27 NOTE — ED Triage Notes (Signed)
Pt c/o pain to epigastric area x 3 days. States finished exercising and started having sharp pains to left chest going down left arm and states it frightened him. Face mildly gray. Sclera yellow. Pain increased with palpation

## 2016-07-27 NOTE — ED Notes (Signed)
Patient transported to CT 

## 2016-07-27 NOTE — ED Notes (Signed)
Patient has follow up visit at [facility] with Dr. Jamie BrookesKonesworan 08/23/16 at 9:20am  [facility].

## 2016-07-27 NOTE — ED Provider Notes (Signed)
[facility] DEPT Provider Note   CSN: [id number] Arrival date & time: 07/27/16  1051     History   Chief Complaint Chief Complaint  Patient presents with  . Chest Pain    HPI Shawn Watkins is a 36 y.o. male.  HPI Complains of anterior chest pain rating to left shoulder onset 3 days ago, constant, became worse 20 minutes after exercising with walking and jogging on treadmill today. Pain is constant and waxes and wanes. Presently is mild. No other associated symptoms. No treatment prior to coming hereToday however has treated himself with antacids over the past few days with improvement of discomfort.. No shortness of breath nausea or sweatiness. Patient was discharged from here on 07/22/2016 after admission for acute alcoholic hepatitis and pancreatitis. He feels much improved since discharge from the hospital. Pain has a mild pleuritic component, nonexertional. No shortness of breath  Past Medical History:  Diagnosis Date  . Alcohol abuse   . Liver problem    "reversible if quits drining"  . Pancreatitis     Patient Active Problem List   Diagnosis Date Noted  . Acute alcoholic hepatitis 07/20/2016  . 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 Acute gallstone pancreatitis 07/17/2016  . Transaminitis 07/17/2016  . Leukocytosis 07/17/2016  . Hyponatremia 07/17/2016  . ETOH abuse 07/17/2016  . Tobacco abuse 07/17/2016    History reviewed. No pertinent surgical history.     Home Medications    Prior to Admission medications   Medication Sig Start Date End Date Taking? Authorizing Provider  folic acid (FOLVITE) 1 MG tablet Take 1 tablet by mouth daily. 06/21/16   [provider]  Multiple Vitamin (MULTIVITAMIN) capsule Take 1 capsule by mouth daily.    [provider]  nicotine (NICODERM CQ - DOSED IN MG/24 HOURS) 21 mg/24hr patch Place 1 patch (21 mg total) onto the skin daily. 07/23/16   [name], MD  prednisoLONE 5 MG TABS tablet Take 6 tablets (30 mg  total) by mouth daily. Decrease by 5mg  per day each week as instructed by me. 07/23/16   [name], MD  thiamine 100 MG tablet Take 1 tablet (100 mg total) by mouth daily. 07/23/16   [name], MD    Family History History reviewed. No pertinent family history. Negative for MI. Father has a pacemaker  Social History Social History  Substance Use Topics  . Smoking status: Current Every Day Smoker    Packs/day: 1.00    Types: Cigarettes  . Smokeless tobacco: Never Used  . Alcohol use Yes     Comment: lsat drink june 2nd   No illicit drug use  Allergies   Patient has no known allergies.   Review of Systems Review of Systems  Constitutional: Negative.   HENT: Negative.   Respiratory: Negative.   Cardiovascular: Positive for chest pain.  Gastrointestinal: Negative.   Musculoskeletal: Negative.   Skin: Positive for color change.       Jaundice  Allergic/Immunologic: Positive for immunocompromised state.       Alcoholic  Neurological: Negative.   Psychiatric/Behavioral: Negative.   All other systems reviewed and are negative.    Physical Exam Updated Vital Signs BP 132/89   Pulse 77   Temp 98.6 F (37 C) (Oral)   Resp 15   Ht 6\' 1"  (1.854 m)   Wt 83.9 kg (185 lb)   SpO2 96%   BMI 24.41 kg/m   Physical Exam  Constitutional: He appears well-developed and well-nourished. No distress.  HENT:  Head: Normocephalic and atraumatic.  Eyes: Conjunctivae are normal. Pupils are equal, round, and reactive to light. Scleral icterus is present.  Neck: Neck supple. No tracheal deviation present. No thyromegaly present.  Cardiovascular: Normal rate and regular rhythm.   No murmur heard. Pulmonary/Chest: Effort normal and breath sounds normal.  Abdominal: Soft. Bowel sounds are normal. He exhibits no distension. There is no tenderness.  Genitourinary: Rectum normal. Rectal exam shows guaiac negative stool.  Genitourinary Comments: Rectal normal tone brown stool Hemoccult negative    Musculoskeletal: Normal range of motion. He exhibits no edema or tenderness.  Neurological: He is alert. Coordination normal.  Skin: Skin is warm and dry. No rash noted.  Psychiatric: He has a normal mood and affect.  Nursing note and vitals reviewed.    ED Treatments / Results  Labs (all labs ordered are listed, but only abnormal results are displayed) Labs Reviewed  CBC WITH DIFFERENTIAL/PLATELET  TROPONIN I  LIPASE, BLOOD  COMPREHENSIVE METABOLIC PANEL    EKG  EKG Interpretation  Date/Time:  Tuesday July 27 2016 11:04:20 EDT Ventricular Rate:  87 PR Interval:    QRS Duration: 75 QT Interval:  364 QTC Calculation: 438 R Axis:   57 Text Interpretation:  Sinus arrhythmia No old tracing to compare Confirmed by Doug Sou [phone]) on 07/27/2016 11:27:55 AM      Results for orders placed or performed during the hospital encounter of 07/27/16  CBC with Differential  Result Value Ref Range   WBC 15.7 (H) 4.0 - 10.5 K/uL   RBC 3.26 (L) 4.22 - 5.81 MIL/uL   Hemoglobin 9.5 (L) 13.0 - 17.0 g/dL   HCT 44.0 (L) 10.2 - 72.5 %   MCV 91.7 78.0 - 100.0 fL   MCH 29.1 26.0 - 34.0 pg   MCHC 31.8 30.0 - 36.0 g/dL   RDW 36.6 44.0 - 34.7 %   Platelets 658 (H) 150 - 400 K/uL   Neutrophils Relative % 85 %   Neutro Abs 13.3 (H) 1.7 - 7.7 K/uL   Lymphocytes Relative 10 %   Lymphs Abs 1.6 0.7 - 4.0 K/uL   Monocytes Relative 4 %   Monocytes Absolute 0.7 0.1 - 1.0 K/uL   Eosinophils Relative 0 %   Eosinophils Absolute 0.0 0.0 - 0.7 K/uL   Basophils Relative 1 %   Basophils Absolute 0.1 0.0 - 0.1 K/uL   WBC Morphology FEW BANDS SEEN   Troponin I  Result Value Ref Range   Troponin I 0.07 (HH) <0.03 ng/mL  Lipase, blood  Result Value Ref Range   Lipase 44 11 - 51 U/L  Comprehensive metabolic panel  Result Value Ref Range   Sodium 129 (L) 135 - 145 mmol/L   Potassium 3.7 3.5 - 5.1 mmol/L   Chloride 92 (L) 101 - 111 mmol/L   CO2 26 22 - 32 mmol/L   Glucose, Bld 301 (H) 65 - 99  mg/dL   BUN 6 6 - 20 mg/dL   Creatinine, Ser 4.25 0.61 - 1.24 mg/dL   Calcium 8.2 (L) 8.9 - 10.3 mg/dL   Total Protein 6.6 6.5 - 8.1 g/dL   Albumin 2.8 (L) 3.5 - 5.0 g/dL   AST 956 (H) 15 - 41 U/L   ALT 190 (H) 17 - 63 U/L   Alkaline Phosphatase 992 (H) 38 - 126 U/L   Total Bilirubin 7.9 (H) 0.3 - 1.2 mg/dL   GFR calc non Af Amer >60 >60 mL/min   GFR calc Af Amer >60 >  60 mL/min   Anion gap 11 5 - 15  D-dimer, quantitative (not at [facility])  Result Value Ref Range   D-Dimer, Quant 2.89 (H) 0.00 - 0.50 ug/mL-FEU  Troponin I  Result Value Ref Range   Troponin I 0.07 (HH) <0.03 ng/mL  POC occult blood, ED RN will collect  Result Value Ref Range   Fecal Occult Bld NEGATIVE NEGATIVE   Dg Chest 2 View  Result Date: 07/27/2016 CLINICAL DATA:  Chest pain and shortness of breath EXAM: CHEST  2 VIEW COMPARISON:  06/06/2010 FINDINGS: Normal heart size and mediastinal contours. No acute infiltrate or edema. No effusion or pneumothorax. No acute osseous findings. IMPRESSION: Negative chest. Electronically Signed   By: Marnee Spring M.D.   On: 07/27/2016 11:52   Ct Angio Chest Pe W Or Wo Contrast  Result Date: 07/27/2016 CLINICAL DATA:  pleuritic cp x couple of days worsened today; elevated d-dimer EXAM: CT ANGIOGRAPHY CHEST WITH CONTRAST TECHNIQUE: Multidetector CT imaging of the chest was performed using the standard protocol during bolus administration of intravenous contrast. Multiplanar CT image reconstructions and MIPs were obtained to evaluate the vascular anatomy. CONTRAST:  100 mL, first injection, and 80 mL second injection, of Isovue 370 intravenous contrast. Study repeated due to inadequate opacification of the pulmonary arteries on the initial scan. COMPARISON:  Current chest radiograph. FINDINGS: Cardiovascular: Satisfactory opacification of the pulmonary arteries to the segmental level. No evidence of pulmonary embolism. Normal heart size. No pericardial effusion. Subtle coronary artery  calcifications are suggested. Great vessels normal caliber. No aortic dissection or atherosclerosis. Mediastinum/Nodes: No neck base or axillary masses or adenopathy. Normal visualized thyroid. There are multiple subcentimeter shotty mediastinal lymph nodes. There are no pathologically enlarged lymph nodes. No mediastinal or hilar masses. Esophagus is unremarkable. Trachea is widely patent. Lungs/Pleura: Lungs are clear. No pleural effusion or pneumothorax. Upper Abdomen: No acute findings. Nonobstructing stone noted in the upper pole the right kidney. Pancreas is atrophic with multiple calcifications consistent chronic pancreatitis. Musculoskeletal: No chest wall abnormality. No acute or significant osseous findings. Review of the MIP images confirms the above findings. IMPRESSION: 1. No evidence of a pulmonary embolism. 2. No acute findings. No findings to account for pleuritic chest pain. Lungs are clear. 3. Pancreas partly imaged. There are changes consistent with chronic pancreatitis. Nonobstructing stone noted in the upper pole the left kidney. Electronically Signed   By: Amie Portland M.D.   On: 07/27/2016 14:26   Ct Abdomen Pelvis W Contrast  Addendum Date: 07/17/2016   ADDENDUM REPORT: 07/17/2016 14:49 ADDENDUM: Addendum for addition of IMPRESSION: 3 mm left lower lobe pulmonary nodule. No follow-up needed if patient is low-risk. Non-contrast chest CT can be considered in 12 months if patient is high-risk. This recommendation follows the consensus statement: Guidelines for Management of Incidental Pulmonary Nodules Detected on CT Images: From the Fleischner Society 2017; Radiology 2017; 284:228-243. Electronically Signed   By: Annia Belt M.D.   On: 07/17/2016 14:49   Result Date: 07/17/2016 CLINICAL DATA:  Patient with sharp abdominal pain and loss of appetite. EXAM: CT ABDOMEN AND PELVIS WITH CONTRAST TECHNIQUE: Multidetector CT imaging of the abdomen and pelvis was performed using the standard  protocol following bolus administration of intravenous contrast. CONTRAST:  ISOVUE-300 IOPAMIDOL (ISOVUE-300) INJECTION 61% COMPARISON:  None. FINDINGS: Lower chest: Normal heart size. Subpleural ground-glass and consolidative opacities within the right lower lobe. Within the left lower lobe there is a 3 mm nodule (image 10; series 4). Small right pleural  effusion. Hepatobiliary: Liver is diffusely low in attenuation compatible with marked steatosis. Gallbladder is unremarkable. No intrahepatic or extrahepatic biliary ductal dilatation. Pancreas: The pancreatic head and proximal body are edematous with surrounding fat stranding. There is calcification throughout the distal body and tail of the pancreas. Spleen: Unremarkable Adrenals/Urinary Tract: The adrenal glands are normal. Kidneys enhance symmetrically with contrast. No hydronephrosis. Urinary bladder is markedly distended. Stomach/Bowel: There is wall thickening of the second and third portion of the duodenum. No evidence for bowel obstruction. Mild wall thickening of the proximal transverse colon and rectum. Vascular/Lymphatic: Normal caliber abdominal aorta. Subcentimeter prominent retroperitoneal lymph nodes. Nonocclusive filling defect demonstrated within the central portal vein (image 53; series 5). Prominent and enlarged porta hepatic lymph nodes measuring up to 1.6 cm (image 38; series 2). Reproductive: Prostate unremarkable. Other: Small bilateral fat containing inguinal hernias. Musculoskeletal: No aggressive or acute appearing osseous lesions. IMPRESSION: Findings most compatible with acute on chronic pancreatitis. The pancreatic head/ uncinate process and proximal body all are enlarged and edematous. There is free fluid tracking within the central abdomen and along the right pericolic gutter. Suggestion of a nonocclusive filling defect within the portal vein/proximal SMV raising the possibility of peripheral nonocclusive thrombosis. Mild wall  thickening of the proximal transverse colon and rectum likely reactive in etiology. Colitis is not excluded. Reactive wall thickening of the duodenum. Distention of the urinary bladder. Hepatic steatosis. Small right pleural effusion. Opacities within the dependent right lower lobe may represent atelectasis or infection. Electronically Signed: By: Annia Belt M.D. On: 07/17/2016 13:41   Mr Abdomen Mrcp Wo Contrast  Result Date: 07/20/2016 CLINICAL DATA:  Jaundice, diarrhea, upper abdominal pain x2 weeks, pancreatitis EXAM: MRI ABDOMEN WITHOUT CONTRAST  (INCLUDING MRCP) TECHNIQUE: Multiplanar multisequence MR imaging of the abdomen was performed. Heavily T2-weighted images of the biliary and pancreatic ducts were obtained, and three-dimensional MRCP images were rendered by post processing. COMPARISON:  Right upper quadrant ultrasound dated 07/18/2016. CT abdomen/pelvis dated 07/17/2016. FINDINGS: Motion degraded images. Lower chest: Mild patchy right lower lobe opacity, likely atelectasis. Hepatobiliary: Moderate hepatic steatosis. No focal hepatic lesion is seen. Gallbladder is unremarkable.  No associated inflammatory changes. No intrahepatic or extrahepatic ductal dilatation. Common duct measures 3-4 mm. No choledocholithiasis is seen. Pancreas: Pancreas is poorly evaluated on this noncontrast, motion degraded study. Peripancreatic inflammatory changes, reflecting acute pancreatitis, with loss of the intrinsic T1 hyperintensity within the pancreatic head and proximal body. No peripancreatic fluid collection/pseudocyst. Possible focal dilatation of the pancreatic duct in the distal pancreatic tail (series 18/ image 25), although this is obscured by coarse parenchymal calcifications on CT. An underlying pancreatic lesion is difficult to entirely exclude in the pancreatic tail (series 18/ image 28), although equivocal. Spleen:  Within normal limits. Adrenals/Urinary Tract:  Adrenal glands are within normal limits.  Kidneys are within normal limits.  No hydronephrosis. Stomach/Bowel: Stomach is within normal limits. Visualized bowel is unremarkable. Vascular/Lymphatic:  No evidence of abdominal aortic aneurysm. No suspicious abdominal lymphadenopathy. Other:  Mild abdominal stranding/ fluid. Musculoskeletal: No focal osseous lesions. IMPRESSION: Limited evaluation due to motion degradation and lack of intravenous contrast administration. No intrahepatic or extrahepatic ductal dilatation. Common duct measures 3-4 mm. No choledocholithiasis is seen. Acute pancreatitis.  No peripancreatic fluid collection/pseudocyst. Possible focal dilatation the pancreatic duct in the distal pancreatic tail. Reason for satellite closely door in. An underlying pancreatic lesion is difficult to entirely exclude in the pancreatic tail given the limitations of the current study. Consider follow MRI abdomen with/without contrast  in 4-6 weeks. Alternatively, consider EUS. Moderate hepatic steatosis. Electronically Signed   By: Charline BillsSriyesh  Krishnan M.D.   On: 07/20/2016 11:58   Mr 3d Recon At Scanner  Result Date: 07/20/2016 CLINICAL DATA:  Jaundice, diarrhea, upper abdominal pain x2 weeks, pancreatitis EXAM: MRI ABDOMEN WITHOUT CONTRAST  (INCLUDING MRCP) TECHNIQUE: Multiplanar multisequence MR imaging of the abdomen was performed. Heavily T2-weighted images of the biliary and pancreatic ducts were obtained, and three-dimensional MRCP images were rendered by post processing. COMPARISON:  Right upper quadrant ultrasound dated 07/18/2016. CT abdomen/pelvis dated 07/17/2016. FINDINGS: Motion degraded images. Lower chest: Mild patchy right lower lobe opacity, likely atelectasis. Hepatobiliary: Moderate hepatic steatosis. No focal hepatic lesion is seen. Gallbladder is unremarkable.  No associated inflammatory changes. No intrahepatic or extrahepatic ductal dilatation. Common duct measures 3-4 mm. No choledocholithiasis is seen. Pancreas: Pancreas is poorly  evaluated on this noncontrast, motion degraded study. Peripancreatic inflammatory changes, reflecting acute pancreatitis, with loss of the intrinsic T1 hyperintensity within the pancreatic head and proximal body. No peripancreatic fluid collection/pseudocyst. Possible focal dilatation of the pancreatic duct in the distal pancreatic tail (series 18/ image 25), although this is obscured by coarse parenchymal calcifications on CT. An underlying pancreatic lesion is difficult to entirely exclude in the pancreatic tail (series 18/ image 28), although equivocal. Spleen:  Within normal limits. Adrenals/Urinary Tract:  Adrenal glands are within normal limits. Kidneys are within normal limits.  No hydronephrosis. Stomach/Bowel: Stomach is within normal limits. Visualized bowel is unremarkable. Vascular/Lymphatic:  No evidence of abdominal aortic aneurysm. No suspicious abdominal lymphadenopathy. Other:  Mild abdominal stranding/ fluid. Musculoskeletal: No focal osseous lesions. IMPRESSION: Limited evaluation due to motion degradation and lack of intravenous contrast administration. No intrahepatic or extrahepatic ductal dilatation. Common duct measures 3-4 mm. No choledocholithiasis is seen. Acute pancreatitis.  No peripancreatic fluid collection/pseudocyst. Possible focal dilatation the pancreatic duct in the distal pancreatic tail. Reason for satellite closely door in. An underlying pancreatic lesion is difficult to entirely exclude in the pancreatic tail given the limitations of the current study. Consider follow MRI abdomen with/without contrast in 4-6 weeks. Alternatively, consider EUS. Moderate hepatic steatosis. Electronically Signed   By: Charline BillsSriyesh  Krishnan M.D.   On: 07/20/2016 11:58   [place] Abdomen Limited Ruq  Result Date: 07/18/2016 CLINICAL DATA:  36 year old male with elevated LFTs and pancreatitis. EXAM: US ABDOMEN LIMITED - RIGHT UPPER QUADRANT COMPARISON:  07/17/2016 CT FINDINGS: Gallbladder: There is no  evidence of cholelithiasis, gallbladder wall thickening or sonographic Murphy's sign. A small amount of pericholecystic fluid is noted. Common bile duct: Diameter: 2.3 mm. There is no evidence of intrahepatic or extrahepatic biliary dilatation. Liver: Diffusely increased echogenicity noted likely representing hepatic steatosis. No focal hepatic abnormalities are present. IMPRESSION: Increased hepatic echogenicity likely representing hepatic steatosis. No focal hepatic abnormality. No evidence of cholelithiasis or acute cholecystitis. No biliary dilatation. Electronically Signed   By: Harmon PierJeffrey  Hu M.D.   On: 07/18/2016 10:10    Radiology No results found.  Procedures Procedures (including critical care time)  Medications Ordered in ED Medications - No data to display   Initial Impression / Assessment and Plan / ED Course  I have reviewed the triage vital signs and the nursing notes.  Pertinent labs & imaging results that were available during my care of the patient were reviewed by me and considered in my medical decision making (see chart for details).     I doubt cardiac etiology of symptoms. Heart score equals 2.  I counseled patient for 5  minutes on smoking cessation. I spoke with Dr.Koneswaren via telephone, in light of mildly elevated troponins not climbing. He is safe for discharge and story is highly atypical for acute coronary syndrome in this young male with normal EKG and highly atypical symptoms. I suggested that he can continue to take antacids as directed. An appointment has been scheduled for him for outpatient cardiac evaluation at [facility] [place] office June 19 at 1:20 PM and aortic dissection, pulmonary embolism ruled out by CT angiogram. Elevated transaminases are stable. Bilirubin is trending downward. Hyponatremia is stable Final Clinical Impressions(s) / ED Diagnoses  Diagnosis #1 atypical chest pain Final diagnoses:  None  #2 anemia   #3  hyperglycemia #4 elevated LFTs #5 tobacco abuse #6 hyponatremia New Prescriptions New Prescriptions   No medications on file     Doug Sou, MD 07/27/16 1624

## 2016-08-02 NOTE — Progress Notes (Signed)
Cardiology Office Note   Date:  08/03/2016   ID:  Shawn Watkins, DOB 09/10/1980, MRN [id number]  PCP:  Patient, No Pcp Per  Cardiologist:   Charlton HawsPeter Charlita Brian, MD   No chief complaint on file.     History of Present Illness: Shawn Watkins is a 36 y.o. male who presents for consultation for chest pain. Referred by Dr Ethelda ChickJacubowitz [facility] Reviewed notes from ER visit 07/27/16 Complained of anterior chest pain radiating to left shoulder for 3 days. Constant after jogging on treadmill. Some improvement with antacids No associated dyspnea, palpitations, diaphoresis or syncope Recent hospitalization 07/22/16 for acute alcoholic hepatitis and pancreatitis. He is a current everyday smoker ECG non acute Troponin .07 x 2.  CTA negative for PE with chronic pancreatic changes ER doctor spoke with Dr Darl HouseholderKoneswaren who felt it was safe to d/c patient home     He is from [place] and has lived in [place] and [place] pursuing [place]. Folks divorced but both living in [place] Brothers and sisters her All supportive of him trying to help with alcoholism. Was sober for 2 years but fell off Wagon 6 months ago Drinks beer  Pain seems better with antacids and probiotics Has had some diarrhea since d/c Residual pain is more epigastric with  Radiation to lower chest Pain can last all day and not exertional   Past Medical History:  Diagnosis Date  . Alcohol abuse   . Liver problem    "reversible if quits drining"  . Pancreatitis     History reviewed. No pertinent surgical history.   Current Outpatient Prescriptions  Medication Sig Dispense Refill  . diphenhydrAMINE (BENADRYL) 25 MG tablet Take 50 mg by mouth at bedtime.    Marland Kitchen. ibuprofen (ADVIL,MOTRIN) 200 MG tablet Take 400-600 mg by mouth every 6 (six) hours as needed for moderate pain.    . Multiple Vitamin (MULTIVITAMIN) capsule Take 1 capsule by mouth daily.    . pantoprazole (PROTONIX) 40 MG tablet Take 1 tablet (40 mg total) by mouth daily. 30 tablet 11  .  thiamine 100 MG tablet Take 1 tablet (100 mg total) by mouth daily. (Patient not taking: Reported on 07/27/2016) 30 tablet 0   No current facility-administered medications for this visit.     Allergies:   Patient has no known allergies.    Social History:  The patient  reports that he has been smoking Cigarettes.  He started smoking about 10 years ago. He has been smoking about 1.00 pack per day. He has never used smokeless tobacco. He reports that he drinks about 60.0 oz of alcohol per week . He reports that he does not use drugs.   Family History:  The patient's family history is not on file.    ROS:  Please see the history of present illness.   Otherwise, review of systems are positive for none.   All other systems are reviewed and negative.    PHYSICAL EXAM: VS:  BP 116/68 (BP Location: Left Arm)   Pulse (!) 114   Ht 6\' 1"  (1.854 m)   Wt 80.3 kg (177 lb)   SpO2 98%   BMI 23.35 kg/m  , BMI Body mass index is 23.35 kg/m. Affect appropriate Healthy:  appears stated age HEENT: normal Neck supple with no adenopathy JVP normal no bruits no thyromegaly Lungs clear with no wheezing and good diaphragmatic motion Heart:  S1/S2 no murmur, no rub, gallop or click PMI normal Abdomen: benighn, BS positve,  no tenderness, no AAA no bruit.  No HSM or HJR Distal pulses intact with no bruits No edema Neuro non-focal Skin warm and dry No muscular weakness    EKG:  07/28/16  SR rate 82 normal ECG    Recent Labs: 07/27/2016: ALT 190; BUN 6; Creatinine, Ser 0.61; Hemoglobin 9.5; Platelets 658; Potassium 3.7; Sodium 129    Lipid Panel No results found for: CHOL, TRIG, HDL, CHOLHDL, VLDL, LDLCALC, LDLDIRECT    Wt Readings from Last 3 Encounters:  08/03/16 80.3 kg (177 lb)  07/27/16 83.9 kg (185 lb)  07/18/16 83.9 kg (185 lb)      Other studies Reviewed: Additional studies/ records that were reviewed today include: ER notes 07/28/16 ECG labs CXR and CTA.    ASSESSMENT AND  PLAN:  1.  Chest Pain:  Atypical recent r/o normal ECG f/u ETT 2. Pancreatitis: suspect he may have residual pain should have f/u CT 3 months r/o pseudocyst 3. ETOH Abuse seemed tremulous today f/u with family support try to get back into inpatient rehab   Current medicines are reviewed at length with the patient today.  The patient does not have concerns regarding medicines.  The following changes have been made:  no change  Labs/ tests ordered today include: ETT   Orders Placed This Encounter  Procedures  . Exercise Tolerance Test     Disposition:   FU with cardiology PRN      Signed, Charlton Haws, MD  08/03/2016 2:11 PM    [facility] [address] [place], [place], [place]  [address] Phone: [phone]; Fax: [phone]

## 2016-08-03 ENCOUNTER — Ambulatory Visit (INDEPENDENT_AMBULATORY_CARE_PROVIDER_SITE_OTHER): Payer: Self-pay | Admitting: Cardiovascular Disease

## 2016-08-03 ENCOUNTER — Encounter: Payer: Self-pay | Admitting: Cardiovascular Disease

## 2016-08-03 VITALS — BP 116/68 | HR 114 | Ht 73.0 in | Wt 177.0 lb

## 2016-08-03 DIAGNOSIS — R079 Chest pain, unspecified: Secondary | ICD-10-CM

## 2016-08-03 MED ORDER — PANTOPRAZOLE SODIUM 40 MG PO TBEC: 40.0000 mg | DELAYED_RELEASE_TABLET | Freq: Every day | ORAL | 11 refills | Status: DC

## 2016-08-03 NOTE — Patient Instructions (Signed)
Medication Instructions:  Start protonix 40 mg daily   Labwork: none  Testing/Procedures: Your physician has requested that you have an exercise tolerance test. For further information please visit [email or web address]. Please also follow instruction sheet, as given.    Follow-Up: Your physician recommends that you schedule a follow-up appointment in:  As needed   Any Other Special Instructions Will Be Listed Below (If Applicable).     If you need a refill on your cardiac medications before your next appointment, please call your pharmacy.

## 2016-08-05 ENCOUNTER — Encounter (INDEPENDENT_AMBULATORY_CARE_PROVIDER_SITE_OTHER): Payer: Self-pay | Admitting: Internal Medicine

## 2016-08-05 ENCOUNTER — Ambulatory Visit (INDEPENDENT_AMBULATORY_CARE_PROVIDER_SITE_OTHER): Payer: Self-pay | Admitting: Internal Medicine

## 2016-08-05 VITALS — BP 140/72 | HR 84 | Temp 98.0°F | Ht 71.0 in | Wt 180.7 lb

## 2016-08-05 DIAGNOSIS — K7010 Alcoholic hepatitis without ascites: Secondary | ICD-10-CM

## 2016-08-05 DIAGNOSIS — R0789 Other chest pain: Secondary | ICD-10-CM

## 2016-08-05 LAB — CBC WITH DIFFERENTIAL/PLATELET
BASOS ABS: 84 {cells}/uL (ref 0–200)
Basophils Relative: 1 %
EOS PCT: 5 %
Eosinophils Absolute: 420 cells/uL (ref 15–500)
HCT: 26.3 % — ABNORMAL LOW (ref 38.5–50.0)
Hemoglobin: 8.8 g/dL — ABNORMAL LOW (ref 13.2–17.1)
LYMPHS PCT: 21 %
Lymphs Abs: 1764 cells/uL (ref 850–3900)
MCH: 30.1 pg (ref 27.0–33.0)
MCHC: 33.5 g/dL (ref 32.0–36.0)
MCV: 90.1 fL (ref 80.0–100.0)
MONOS PCT: 8 %
MPV: 8.5 fL (ref 7.5–12.5)
Monocytes Absolute: 672 cells/uL (ref 200–950)
Neutro Abs: 5460 cells/uL (ref 1500–7800)
Neutrophils Relative %: 65 %
PLATELETS: 768 10*3/uL — AB (ref 140–400)
RBC: 2.92 MIL/uL — ABNORMAL LOW (ref 4.20–5.80)
RDW: 17.4 % — AB (ref 11.0–15.0)
WBC: 8.4 10*3/uL (ref 3.8–10.8)

## 2016-08-05 NOTE — Patient Instructions (Signed)
OV in 3 months. 

## 2016-08-05 NOTE — Progress Notes (Addendum)
Subjective:    Patient ID: Shawn Watkins, male    DOB: Mar 28, 1980, 36 y.o.   MRN: [id number]  HPI Here today for f/u. Recently admitted to [facility] with Alcoholic hepatitis. Hx of etoh abuse since he was 36 yrs old.  Had been drinking excessive amount since age 34.  Hx of withdrawal in the past.  Hx of pancreatitis.  He tells me he is doing good. Is going to the [facility] and exercising. Appetite is good. No weight loss. Has not drank in 18 days.  Recently seen in the ED chest pain . Evaluated by Cardiology and work up was negative. Troponin's  were positive.  D. Dimer positive but CT angiochest was negative for PE. Marland KitchenStress scheduled for 08/09/2016 Hepatitis markers were negative.  Thrombocytopenia related to his liver disease. Bilirubin has come down significantly   Hepatic Function Latest Ref Rng & Units 07/27/2016 07/22/2016 07/21/2016  Total Protein 6.5 - 8.1 g/dL 6.6 6.1(L) 6.4(L)  Albumin 3.5 - 5.0 g/dL 2.8(L) 2.4(L) 2.7(L)  AST 15 - 41 U/L 128(H) 150(H) 155(H)  ALT 17 - 63 U/L 190(H) 166(H) 162(H)  Alk Phosphatase 38 - 126 U/L 992(H) 1,267(H) 1,280(H)  Total Bilirubin 0.3 - 1.2 mg/dL 7.9(H) 19.0(HH) 26.5(HH)  Bilirubin, Direct 0.1 - 0.5 mg/dL - 11.5(H) -   CBC    Component Value Date/Time   WBC 15.7 (H) 07/27/2016 1108   RBC 3.26 (L) 07/27/2016 1108   HGB 9.5 (L) 07/27/2016 1108   HCT 29.9 (L) 07/27/2016 1108   PLT 658 (H) 07/27/2016 1108   MCV 91.7 07/27/2016 1108   MCH 29.1 07/27/2016 1108   MCHC 31.8 07/27/2016 1108   RDW 14.5 07/27/2016 1108   LYMPHSABS 1.6 07/27/2016 1108   MONOABS 0.7 07/27/2016 1108   EOSABS 0.0 07/27/2016 1108   BASOSABS 0.1 07/27/2016 1108        Review of Systems Past Medical History:  Diagnosis Date  . Alcohol abuse   . Liver problem    "reversible if quits drining"  . Pancreatitis     No past surgical history on file.  No Known Allergies  Current Outpatient Prescriptions on File Prior to Visit  Medication Sig Dispense Refill  .  diphenhydrAMINE (BENADRYL) 25 MG tablet Take 50 mg by mouth at bedtime.    Marland Kitchen ibuprofen (ADVIL,MOTRIN) 200 MG tablet Take 400-600 mg by mouth every 6 (six) hours as needed for moderate pain.    . Multiple Vitamin (MULTIVITAMIN) capsule Take 1 capsule by mouth daily.    . pantoprazole (PROTONIX) 40 MG tablet Take 1 tablet (40 mg total) by mouth daily. 30 tablet 11  . thiamine 100 MG tablet Take 1 tablet (100 mg total) by mouth daily. (Patient not taking: Reported on 08/05/2016) 30 tablet 0   No current facility-administered medications on file prior to visit.         Objective:   Physical Exam Blood pressure 140/72, pulse 84, temperature 98 F (36.7 C), height '5\' 11"'$  (1.803 m), weight 180 lb 11.2 oz (82 kg).  Alert and oriented. Skin warm and dry. Oral mucosa is moist.   . Sclera icteric, conjunctivae is pink. Thyroid not enlarged. No cervical lymphadenopathy. Lungs clear. Heart regular rate and rhythm.  Abdomen is soft. Bowel sounds are positive. No hepatomegaly. No abdominal masses felt. No tenderness.  No edema to lower extremities.           Assessment & Plan:  Alcoholic Hepatitis. CMET, CBC, PT/INR,   Chest pain: troponin.  If pain returns, go to the ED

## 2016-08-06 LAB — COMPREHENSIVE METABOLIC PANEL
ALBUMIN: 3.3 g/dL — AB (ref 3.6–5.1)
ALT: 49 U/L — ABNORMAL HIGH (ref 9–46)
AST: 39 U/L (ref 10–40)
Alkaline Phosphatase: 328 U/L — ABNORMAL HIGH (ref 40–115)
BUN: 10 mg/dL (ref 7–25)
CALCIUM: 8.9 mg/dL (ref 8.6–10.3)
CHLORIDE: 99 mmol/L (ref 98–110)
CO2: 26 mmol/L (ref 20–31)
Creat: 0.67 mg/dL (ref 0.60–1.35)
GLUCOSE: 197 mg/dL — AB (ref 65–99)
Potassium: 4.4 mmol/L (ref 3.5–5.3)
Sodium: 135 mmol/L (ref 135–146)
Total Bilirubin: 4 mg/dL — ABNORMAL HIGH (ref 0.2–1.2)
Total Protein: 6.5 g/dL (ref 6.1–8.1)

## 2016-08-06 LAB — TROPONIN I: Troponin I: <0.01 ng/mL (ref <=0.05)

## 2016-08-06 LAB — PROTIME-INR
INR: 1.3 — ABNORMAL HIGH
PROTHROMBIN TIME: 13.7 s — AB (ref 9.0–11.5)

## 2016-08-09 ENCOUNTER — Ambulatory Visit (HOSPITAL_COMMUNITY)
Admission: RE | Admit: 2016-08-09 | Discharge: 2016-08-09 | Disposition: A | Discharge status: Home / Self Care | Payer: Self-pay | Source: Ambulatory Visit | Attending: Cardiovascular Disease | Admitting: Cardiovascular Disease

## 2016-08-09 ENCOUNTER — Other Ambulatory Visit (INDEPENDENT_AMBULATORY_CARE_PROVIDER_SITE_OTHER): Payer: Self-pay | Admitting: *Deleted

## 2016-08-09 ENCOUNTER — Telehealth (INDEPENDENT_AMBULATORY_CARE_PROVIDER_SITE_OTHER): Payer: Self-pay | Admitting: Internal Medicine

## 2016-08-09 DIAGNOSIS — K860 Alcohol-induced chronic pancreatitis: Secondary | ICD-10-CM

## 2016-08-09 DIAGNOSIS — R7401 Elevation of levels of liver transaminase levels: Secondary | ICD-10-CM

## 2016-08-09 DIAGNOSIS — R740 Nonspecific elevation of levels of transaminase and lactic acid dehydrogenase [LDH]: Secondary | ICD-10-CM

## 2016-08-09 DIAGNOSIS — R079 Chest pain, unspecified: Secondary | ICD-10-CM | POA: Insufficient documentation

## 2016-08-09 LAB — EXERCISE TOLERANCE TEST
CHL CUP MPHR: 186 {beats}/min
CSEPEDS: 47 s
CSEPHR: 87 %
Estimated workload: 12.6 METS
Exercise duration (min): 9 min
Peak HR: 162 {beats}/min
RPE: 13
Rest HR: 90 {beats}/min

## 2016-08-09 NOTE — Telephone Encounter (Signed)
CBC and CMet in 2 weeks. I left 3 cards at front desk. Left note, needs directions on how to do.

## 2016-08-09 NOTE — Telephone Encounter (Signed)
Lab work has been noted for 2 weeks. A letter will be sent as a reminder.

## 2016-08-11 ENCOUNTER — Encounter (INDEPENDENT_AMBULATORY_CARE_PROVIDER_SITE_OTHER): Payer: Self-pay | Admitting: *Deleted

## 2016-08-11 ENCOUNTER — Other Ambulatory Visit (INDEPENDENT_AMBULATORY_CARE_PROVIDER_SITE_OTHER): Payer: Self-pay | Admitting: *Deleted

## 2016-08-11 DIAGNOSIS — K860 Alcohol-induced chronic pancreatitis: Secondary | ICD-10-CM

## 2016-08-11 DIAGNOSIS — R740 Nonspecific elevation of levels of transaminase and lactic acid dehydrogenase [LDH]: Secondary | ICD-10-CM

## 2016-08-11 DIAGNOSIS — R7401 Elevation of levels of liver transaminase levels: Secondary | ICD-10-CM

## 2016-08-23 ENCOUNTER — Telehealth (INDEPENDENT_AMBULATORY_CARE_PROVIDER_SITE_OTHER): Payer: Self-pay | Admitting: Internal Medicine

## 2016-08-23 ENCOUNTER — Ambulatory Visit: Payer: Self-pay | Admitting: Cardiovascular Disease

## 2016-08-23 ENCOUNTER — Other Ambulatory Visit (INDEPENDENT_AMBULATORY_CARE_PROVIDER_SITE_OTHER): Payer: Self-pay | Admitting: *Deleted

## 2016-08-23 DIAGNOSIS — K860 Alcohol-induced chronic pancreatitis: Secondary | ICD-10-CM

## 2016-08-23 LAB — COMPLETE METABOLIC PANEL WITH GFR
ALT: 25 U/L (ref 9–46)
AST: 25 U/L (ref 10–40)
Albumin: 3.8 g/dL (ref 3.6–5.1)
Alkaline Phosphatase: 114 U/L (ref 40–115)
BUN: 13 mg/dL (ref 7–25)
CO2: 26 mmol/L (ref 20–31)
Calcium: 8.9 mg/dL (ref 8.6–10.3)
Chloride: 105 mmol/L (ref 98–110)
Creat: 0.68 mg/dL (ref 0.60–1.35)
Glucose, Bld: 119 mg/dL — ABNORMAL HIGH (ref 65–99)
POTASSIUM: 4.8 mmol/L (ref 3.5–5.3)
Sodium: 137 mmol/L (ref 135–146)
Total Bilirubin: 1.6 mg/dL — ABNORMAL HIGH (ref 0.2–1.2)
Total Protein: 6.6 g/dL (ref 6.1–8.1)

## 2016-08-23 LAB — CBC
HCT: 29.8 % — ABNORMAL LOW (ref 38.5–50.0)
Hemoglobin: 9.6 g/dL — ABNORMAL LOW (ref 13.2–17.1)
MCH: 29.9 pg (ref 27.0–33.0)
MCHC: 32.2 g/dL (ref 32.0–36.0)
MCV: 92.8 fL (ref 80.0–100.0)
MPV: 8.3 fL (ref 7.5–12.5)
PLATELETS: 484 10*3/uL — AB (ref 140–400)
RBC: 3.21 MIL/uL — ABNORMAL LOW (ref 4.20–5.80)
RDW: 14.7 % (ref 11.0–15.0)
WBC: 8.1 10*3/uL (ref 3.8–10.8)

## 2016-08-23 NOTE — Telephone Encounter (Signed)
Tammy, CBC and hepatic in 4 weeks.

## 2016-08-23 NOTE — Telephone Encounter (Signed)
Labs are noted for 4 weeks. A letter will be sent to the patient as a reminder.

## 2016-09-13 ENCOUNTER — Encounter (INDEPENDENT_AMBULATORY_CARE_PROVIDER_SITE_OTHER): Payer: Self-pay | Admitting: *Deleted

## 2016-09-13 ENCOUNTER — Other Ambulatory Visit (INDEPENDENT_AMBULATORY_CARE_PROVIDER_SITE_OTHER): Payer: Self-pay | Admitting: *Deleted

## 2016-09-13 DIAGNOSIS — K860 Alcohol-induced chronic pancreatitis: Secondary | ICD-10-CM

## 2016-10-11 ENCOUNTER — Encounter (HOSPITAL_COMMUNITY): Payer: Self-pay | Admitting: Adult Health

## 2016-10-11 ENCOUNTER — Emergency Department (HOSPITAL_COMMUNITY)
Admission: EM | Admit: 2016-10-11 | Discharge: 2016-10-11 | Disposition: A | Discharge status: Home / Self Care | Payer: Self-pay | Attending: Emergency Medicine | Admitting: Emergency Medicine

## 2016-10-11 DIAGNOSIS — F17210 Nicotine dependence, cigarettes, uncomplicated: Secondary | ICD-10-CM | POA: Insufficient documentation

## 2016-10-11 DIAGNOSIS — F1010 Alcohol abuse, uncomplicated: Secondary | ICD-10-CM | POA: Insufficient documentation

## 2016-10-11 DIAGNOSIS — R109 Unspecified abdominal pain: Secondary | ICD-10-CM | POA: Insufficient documentation

## 2016-10-11 DIAGNOSIS — F10920 Alcohol use, unspecified with intoxication, uncomplicated: Secondary | ICD-10-CM

## 2016-10-11 DIAGNOSIS — Z79899 Other long term (current) drug therapy: Secondary | ICD-10-CM | POA: Insufficient documentation

## 2016-10-11 DIAGNOSIS — K2920 Alcoholic gastritis without bleeding: Secondary | ICD-10-CM | POA: Insufficient documentation

## 2016-10-11 LAB — COMPREHENSIVE METABOLIC PANEL
ALK PHOS: 75 U/L (ref 38–126)
ALT: 14 U/L — AB (ref 17–63)
AST: 24 U/L (ref 15–41)
Albumin: 4.1 g/dL (ref 3.5–5.0)
Anion gap: 10 (ref 5–15)
BILIRUBIN TOTAL: 0.5 mg/dL (ref 0.3–1.2)
BUN: 13 mg/dL (ref 6–20)
CALCIUM: 8.4 mg/dL — AB (ref 8.9–10.3)
CHLORIDE: 98 mmol/L — AB (ref 101–111)
CO2: 30 mmol/L (ref 22–32)
CREATININE: 0.8 mg/dL (ref 0.61–1.24)
Glucose, Bld: 211 mg/dL — ABNORMAL HIGH (ref 65–99)
Potassium: 4.3 mmol/L (ref 3.5–5.1)
Sodium: 138 mmol/L (ref 135–145)
TOTAL PROTEIN: 6.8 g/dL (ref 6.5–8.1)

## 2016-10-11 LAB — CBC WITH DIFFERENTIAL/PLATELET
BASOS ABS: 0 10*3/uL (ref 0.0–0.1)
Basophils Relative: 0 %
EOS ABS: 0 10*3/uL (ref 0.0–0.7)
EOS PCT: 1 %
HCT: 41.3 % (ref 39.0–52.0)
Hemoglobin: 14.4 g/dL (ref 13.0–17.0)
Lymphocytes Relative: 36 %
Lymphs Abs: 1.7 10*3/uL (ref 0.7–4.0)
MCH: 29.4 pg (ref 26.0–34.0)
MCHC: 34.9 g/dL (ref 30.0–36.0)
MCV: 84.3 fL (ref 78.0–100.0)
Monocytes Absolute: 0.2 10*3/uL (ref 0.1–1.0)
Monocytes Relative: 4 %
Neutro Abs: 2.8 10*3/uL (ref 1.7–7.7)
Neutrophils Relative %: 59 %
PLATELETS: 222 10*3/uL (ref 150–400)
RBC: 4.9 MIL/uL (ref 4.22–5.81)
RDW: 12.7 % (ref 11.5–15.5)
WBC: 4.8 10*3/uL (ref 4.0–10.5)

## 2016-10-11 LAB — ETHANOL: ALCOHOL ETHYL (B): 238 mg/dL — AB (ref <5)

## 2016-10-11 LAB — RAPID URINE DRUG SCREEN, HOSP PERFORMED
Amphetamines: NOT DETECTED
BENZODIAZEPINES: NOT DETECTED
Barbiturates: NOT DETECTED
COCAINE: NOT DETECTED
Opiates: NOT DETECTED
Tetrahydrocannabinol: NOT DETECTED

## 2016-10-11 LAB — LIPASE, BLOOD: LIPASE: 19 U/L (ref 11–51)

## 2016-10-11 MED ORDER — LORAZEPAM 1 MG PO TABS: 1.0000 mg | ORAL_TABLET | Freq: Three times a day (TID) | ORAL | 0 refills | Status: DC | PRN

## 2016-10-11 MED ORDER — LORAZEPAM 2 MG/ML IJ SOLN
1.0000 mg | Freq: Once | INTRAMUSCULAR | Status: AC
  Administered 2016-10-11: 1 mg via INTRAVENOUS
  Filled 2016-10-11: qty 1

## 2016-10-11 MED ORDER — ONDANSETRON HCL 4 MG/2ML IJ SOLN
4.0000 mg | Freq: Once | INTRAMUSCULAR | Status: AC
  Administered 2016-10-11: 4 mg via INTRAVENOUS
  Filled 2016-10-11: qty 2

## 2016-10-11 MED ORDER — SODIUM CHLORIDE 0.9 % IV SOLN
INTRAVENOUS | Status: DC
  Administered 2016-10-11: 11:00:00 via INTRAVENOUS

## 2016-10-11 MED ORDER — SODIUM CHLORIDE 0.9 % IV BOLUS (SEPSIS)
1000.0000 mL | Freq: Once | INTRAVENOUS | Status: AC
  Administered 2016-10-11: 1000 mL via INTRAVENOUS

## 2016-10-11 MED ORDER — PANTOPRAZOLE SODIUM 40 MG IV SOLR
40.0000 mg | Freq: Once | INTRAVENOUS | Status: AC
  Administered 2016-10-11: 40 mg via INTRAVENOUS
  Filled 2016-10-11: qty 40

## 2016-10-11 NOTE — ED Triage Notes (Signed)
PResents with alcohol withdrawal after having a rel;apse from 3 months sober. He endorses going on a 7 day binder and last drink was about 24-30 hours ago. Per mother he went missing about a week ago and she found him in a motel with "bad things going on" She took him and he was confused and drunk, he began to have vomiting and nausea and confusion last night. HE has a history of DTs and hospitalization from DTs. HE is alert and has tremors at this time, c/o severe headache. BP is 133/90 and HR 85.  He is very tearful

## 2016-10-11 NOTE — Discharge Instructions (Signed)
Take the Ativan as directed. Follow-up with day Loraine Leriche for help with the substance abuse problem. Return as needed.

## 2016-10-11 NOTE — ED Provider Notes (Signed)
[facility]-EMER[facility]Y DEPT Provider Note   CSN: [id number] Arrival date & time: 10/11/16  0906     History   Chief Complaint Chief Complaint  Patient presents with  . Delirium Tremens (DTS)    HPI Shawn Watkins is a 36 y.o. male.  Patient with a known history of alcohol abuse. Had been dry for a period time. Went on a binge this week. Came in with concerns for withdrawal and nausea and vomiting and some abdominal discomfort but not severe. Patient was found in a hotel room by his mother. And brought in here for evaluation.      Past Medical History:  Diagnosis Date  . Alcohol abuse   . Liver problem    "reversible if quits drining"  . Pancreatitis     Patient Active Problem List   Diagnosis Date Noted  . Acute alcoholic hepatitis 07/20/2016  . 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 Acute gallstone pancreatitis 07/17/2016  . Transaminitis 07/17/2016  . Leukocytosis 07/17/2016  . Hyponatremia 07/17/2016  . ETOH abuse 07/17/2016  . Tobacco abuse 07/17/2016    History reviewed. No pertinent surgical history.     Home Medications    Prior to Admission medications   Medication Sig Start Date End Date Taking? Authorizing Provider  ibuprofen (ADVIL,MOTRIN) 200 MG tablet Take 400-600 mg by mouth every 6 (six) hours as needed for moderate pain.   Yes [provider]  pantoprazole (PROTONIX) 40 MG tablet Take 1 tablet (40 mg total) by mouth daily. 08/03/16  Yes Wendall Stade, MD  Saccharomyces boulardii (PROBIOTIC) 250 MG CAPS Take 1 capsule by mouth daily.   Yes [provider]  diphenhydrAMINE (BENADRYL) 25 MG tablet Take 50 mg by mouth at bedtime.    [provider]  LORazepam (ATIVAN) 1 MG tablet Take 1 tablet (1 mg total) by mouth 3 (three) times daily as needed for anxiety. 10/11/16   Vanetta Mulders, MD  Multiple Vitamin (MULTIVITAMIN) capsule Take 1 capsule by mouth daily.    [provider]  thiamine 100 MG tablet Take 1 tablet  (100 mg total) by mouth daily. Patient not taking: Reported on 08/05/2016 07/23/16   [name], MD    Family History History reviewed. No pertinent family history.  Social History Social History  Substance Use Topics  . Smoking status: Current Every Day Smoker    Packs/day: 1.00    Types: Cigarettes    Start date: 08/04/2006  . Smokeless tobacco: Never Used  . Alcohol use 60.0 oz/week    100 Cans of beer per week     Comment: lsat drink june 2nd     Allergies   Patient has no known allergies.   Review of Systems Review of Systems  Constitutional: Negative for fever.  HENT: Negative for congestion.   Eyes: Negative for redness.  Respiratory: Negative for shortness of breath.   Cardiovascular: Negative for chest pain.  Gastrointestinal: Positive for abdominal pain, nausea and vomiting.  Genitourinary: Negative for dysuria.  Musculoskeletal: Negative for back pain.  Skin: Negative for rash.  Neurological: Negative for headaches.  Hematological: Does not bruise/bleed easily.  Psychiatric/Behavioral: Negative for confusion.     Physical Exam Updated Vital Signs BP 120/76   Pulse 91   Temp 98.6 F (37 C) (Oral)   Resp 12   Ht 1.854 m (6\' 1" )   Wt 83.9 kg (185 lb)   SpO2 93%   BMI 24.41 kg/m   Physical Exam  Constitutional: He is oriented to  person, place, and time. He appears well-developed and well-nourished. No distress.  HENT:  Head: Normocephalic and atraumatic.  Mouth/Throat: Oropharynx is clear and moist.  Eyes: Pupils are equal, round, and reactive to light. Conjunctivae and EOM are normal.  Neck: Normal range of motion. Neck supple.  Cardiovascular: Normal rate and regular rhythm.   Pulmonary/Chest: Effort normal and breath sounds normal.  Abdominal: Soft. Bowel sounds are normal. There is no tenderness.  Musculoskeletal: Normal range of motion.  Neurological: He is alert and oriented to person, place, and time. No cranial nerve deficit or sensory  deficit. He exhibits normal muscle tone. Coordination normal.  Skin: Skin is warm.  Nursing note and vitals reviewed.    ED Treatments / Results  Labs (all labs ordered are listed, but only abnormal results are displayed) Labs Reviewed  COMPREHENSIVE METABOLIC PANEL - Abnormal; Notable for the following:       Result Value   Chloride 98 (*)    Glucose, Bld 211 (*)    Calcium 8.4 (*)    ALT 14 (*)    All other components within normal limits  ETHANOL - Abnormal; Notable for the following:    Alcohol, Ethyl (B) 238 (*)    All other components within normal limits  LIPASE, BLOOD  CBC WITH DIFFERENTIAL/PLATELET  RAPID URINE DRUG SCREEN, HOSP PERFORMED    EKG  EKG Interpretation None       Radiology No results found.  Procedures Procedures (including critical care time)  Medications Ordered in ED Medications  0.9 %  sodium chloride infusion ( Intravenous New Bag/Given 10/11/16 1113)  sodium chloride 0.9 % bolus 1,000 mL (0 mLs Intravenous Stopped 10/11/16 1113)  LORazepam (ATIVAN) injection 1 mg (1 mg Intravenous Given 10/11/16 1027)  pantoprazole (PROTONIX) injection 40 mg (40 mg Intravenous Given 10/11/16 1020)  ondansetron (ZOFRAN) injection 4 mg (4 mg Intravenous Given 10/11/16 1019)  LORazepam (ATIVAN) injection 1 mg (1 mg Intravenous Given 10/11/16 1326)  sodium chloride 0.9 % bolus 1,000 mL (0 mLs Intravenous Stopped 10/11/16 1532)  LORazepam (ATIVAN) injection 1 mg (1 mg Intravenous Given 10/11/16 1550)     Initial Impression / Assessment and Plan / ED Course  I have reviewed the triage vital signs and the nursing notes.  Pertinent labs & imaging results that were available during my care of the patient were reviewed by me and considered in my medical decision making (see chart for details).     Patient was significant improvement with antinausea medicine 2 L of fluid and Protonix for the presumed alcoholic gastritis. Patient's blood alcohol level was quite high  but he was quite functional. Patient also given Ativan no significant abnormalities in heart rate or blood pressure. Patient not going through any significant withdrawal symptoms currently. Patient will be continued on Ativan. Outpatient referral for helping to stop the alcohol abuse provided. Patient in addition ate  food here without any difficulties. Patient very functional at the time of discharge.  Final Clinical Impressions(s) / ED Diagnoses   Final diagnoses:  Alcohol abuse  Alcoholic intoxication without complication (HCC)  Acute alcoholic gastritis without hemorrhage    New Prescriptions New Prescriptions   LORAZEPAM (ATIVAN) 1 MG TABLET    Take 1 tablet (1 mg total) by mouth 3 (three) times daily as needed for anxiety.     Vanetta Mulders, MD 10/11/16 [phone]

## 2016-10-11 NOTE — ED Notes (Signed)
Meal given

## 2016-10-11 NOTE — ED Notes (Signed)
Pt made aware to return if symptoms worsen or if any life threatening symptoms occur.   

## 2016-11-05 ENCOUNTER — Ambulatory Visit (INDEPENDENT_AMBULATORY_CARE_PROVIDER_SITE_OTHER): Payer: Self-pay | Admitting: Internal Medicine

## 2016-11-08 ENCOUNTER — Encounter (INDEPENDENT_AMBULATORY_CARE_PROVIDER_SITE_OTHER): Payer: Self-pay | Admitting: Internal Medicine

## 2016-11-15 ENCOUNTER — Encounter (HOSPITAL_COMMUNITY): Payer: Self-pay | Admitting: Emergency Medicine

## 2016-11-15 ENCOUNTER — Emergency Department (HOSPITAL_COMMUNITY)
Admission: EM | Admit: 2016-11-15 | Discharge: 2016-11-15 | Disposition: A | Discharge status: Home / Self Care | Payer: Self-pay | Attending: Emergency Medicine | Admitting: Emergency Medicine

## 2016-11-15 DIAGNOSIS — F17210 Nicotine dependence, cigarettes, uncomplicated: Secondary | ICD-10-CM | POA: Insufficient documentation

## 2016-11-15 DIAGNOSIS — R748 Abnormal levels of other serum enzymes: Secondary | ICD-10-CM

## 2016-11-15 DIAGNOSIS — F10930 Alcohol use, unspecified with withdrawal, uncomplicated: Secondary | ICD-10-CM

## 2016-11-15 DIAGNOSIS — E876 Hypokalemia: Secondary | ICD-10-CM

## 2016-11-15 DIAGNOSIS — E860 Dehydration: Secondary | ICD-10-CM

## 2016-11-15 DIAGNOSIS — F10230 Alcohol dependence with withdrawal, uncomplicated: Secondary | ICD-10-CM

## 2016-11-15 HISTORY — DX: Major depressive disorder, single episode, unspecified: F32.9

## 2016-11-15 HISTORY — DX: Depression, unspecified: F32.A

## 2016-11-15 LAB — COMPREHENSIVE METABOLIC PANEL
ALBUMIN: 3.7 g/dL (ref 3.5–5.0)
ALT: 78 U/L — AB (ref 17–63)
AST: 231 U/L — AB (ref 15–41)
Alkaline Phosphatase: 279 U/L — ABNORMAL HIGH (ref 38–126)
BUN: 20 mg/dL (ref 6–20)
CHLORIDE: 83 mmol/L — AB (ref 101–111)
CO2: 22 mmol/L (ref 22–32)
CREATININE: 0.83 mg/dL (ref 0.61–1.24)
Calcium: 9 mg/dL (ref 8.9–10.3)
GFR calc Af Amer: >60 mL/min (ref >60)
GFR calc non Af Amer: >60 mL/min (ref >60)
GLUCOSE: 145 mg/dL — AB (ref 65–99)
POTASSIUM: 3.2 mmol/L — AB (ref 3.5–5.1)
Sodium: 128 mmol/L — ABNORMAL LOW (ref 135–145)
Total Bilirubin: 6.3 mg/dL — ABNORMAL HIGH (ref 0.3–1.2)
Total Protein: 7 g/dL (ref 6.5–8.1)

## 2016-11-15 LAB — URINALYSIS, ROUTINE W REFLEX MICROSCOPIC
BACTERIA UA: NONE SEEN
Glucose, UA: 50 mg/dL — AB
Hgb urine dipstick: NEGATIVE
KETONES UR: 5 mg/dL — AB
LEUKOCYTES UA: NEGATIVE
Nitrite: NEGATIVE
PH: 5 (ref 5.0–8.0)
PROTEIN: 30 mg/dL — AB
Specific Gravity, Urine: 1.031 — ABNORMAL HIGH (ref 1.005–1.030)

## 2016-11-15 LAB — CBC WITH DIFFERENTIAL/PLATELET
BASOS PCT: 0 %
Basophils Absolute: 0 10*3/uL (ref 0.0–0.1)
Eosinophils Absolute: 0 10*3/uL (ref 0.0–0.7)
Eosinophils Relative: 0 %
HEMATOCRIT: 34.8 % — AB (ref 39.0–52.0)
HEMOGLOBIN: 12.4 g/dL — AB (ref 13.0–17.0)
Lymphocytes Relative: 36 %
Lymphs Abs: 1.6 10*3/uL (ref 0.7–4.0)
MCH: 29 pg (ref 26.0–34.0)
MCHC: 35.6 g/dL (ref 30.0–36.0)
MCV: 81.5 fL (ref 78.0–100.0)
MONOS PCT: 6 %
Monocytes Absolute: 0.3 10*3/uL (ref 0.1–1.0)
NEUTROS ABS: 2.6 10*3/uL (ref 1.7–7.7)
NEUTROS PCT: 58 %
Platelets: 143 10*3/uL — ABNORMAL LOW (ref 150–400)
RBC: 4.27 MIL/uL (ref 4.22–5.81)
RDW: 15.1 % (ref 11.5–15.5)
WBC: 4.4 10*3/uL (ref 4.0–10.5)

## 2016-11-15 LAB — RAPID URINE DRUG SCREEN, HOSP PERFORMED
Amphetamines: NOT DETECTED
BARBITURATES: NOT DETECTED
BENZODIAZEPINES: NOT DETECTED
COCAINE: NOT DETECTED
Opiates: NOT DETECTED
Tetrahydrocannabinol: NOT DETECTED

## 2016-11-15 LAB — LIPASE, BLOOD: LIPASE: 18 U/L (ref 11–51)

## 2016-11-15 MED ORDER — LORAZEPAM 1 MG PO TABS: 1.0000 mg | ORAL_TABLET | Freq: Three times a day (TID) | ORAL | 0 refills | Status: DC | PRN

## 2016-11-15 MED ORDER — LORAZEPAM 1 MG PO TABS
1.0000 mg | ORAL_TABLET | Freq: Once | ORAL | Status: AC
  Administered 2016-11-15: 1 mg via ORAL
  Filled 2016-11-15: qty 1

## 2016-11-15 MED ORDER — POTASSIUM CHLORIDE 10 MEQ/100ML IV SOLN
10.0000 meq | INTRAVENOUS | Status: AC
  Administered 2016-11-15 (×2): 10 meq via INTRAVENOUS
  Filled 2016-11-15 (×2): qty 100

## 2016-11-15 MED ORDER — ONDANSETRON HCL 4 MG PO TABS: 4.0000 mg | ORAL_TABLET | Freq: Three times a day (TID) | ORAL | 0 refills | Status: DC | PRN

## 2016-11-15 MED ORDER — POTASSIUM CHLORIDE CRYS ER 20 MEQ PO TBCR: 40.0000 meq | EXTENDED_RELEASE_TABLET | Freq: Two times a day (BID) | ORAL | 0 refills | Status: DC

## 2016-11-15 MED ORDER — POTASSIUM CHLORIDE CRYS ER 20 MEQ PO TBCR
40.0000 meq | EXTENDED_RELEASE_TABLET | Freq: Once | ORAL | Status: AC
  Administered 2016-11-15: 40 meq via ORAL
  Filled 2016-11-15: qty 2

## 2016-11-15 MED ORDER — SODIUM CHLORIDE 0.9 % IV BOLUS (SEPSIS)
1000.0000 mL | Freq: Once | INTRAVENOUS | Status: AC
  Administered 2016-11-15: 1000 mL via INTRAVENOUS

## 2016-11-15 MED ORDER — ONDANSETRON HCL 4 MG/2ML IJ SOLN
4.0000 mg | Freq: Once | INTRAMUSCULAR | Status: AC
  Administered 2016-11-15: 4 mg via INTRAVENOUS
  Filled 2016-11-15: qty 2

## 2016-11-15 MED ORDER — LORAZEPAM 2 MG/ML IJ SOLN
1.0000 mg | Freq: Once | INTRAMUSCULAR | Status: AC
  Administered 2016-11-15: 1 mg via INTRAVENOUS
  Filled 2016-11-15: qty 1

## 2016-11-15 NOTE — ED Notes (Signed)
Pt vomited several times during assessment.  Visual shakes and pt is anxious.

## 2016-11-15 NOTE — ED Notes (Signed)
Per MD Mesner give pt another L of NS and see if pt can urinate. Allowed to have meal tray.

## 2016-11-15 NOTE — ED Notes (Signed)
Pt continues to c/o nausea, MD informed.

## 2016-11-15 NOTE — ED Notes (Signed)
Pt not able to void at this time.

## 2016-11-15 NOTE — ED Provider Notes (Signed)
Emergency Department Provider Note   I have reviewed the triage vital signs and the nursing notes.   HISTORY  Chief Complaint Alcohol Problem   HPI Shawn Watkins is a 36 y.o. male with a history of alcoholism, liver transaminitis Shawn Watkins presents to the emergency department today secondary to akes. Patient states that he was seen here mother half ago and was given some medication to help with withdrawal symptoms and he did well then got more depressed a few weeks ago and was made his depression by continue to drink. He had one week where he drank over 20 beers a daybut over the last couple weeks he has decreased his alcohol intake in the last 24 hours has not had anything to drink in this morning he woke up with worsening nausea shakes and sweatiness. This is similar to his previous withdrawal symptoms. States he has had a seizure in the past with withdrawal. No other alcohol or drugs. No suicidal or homicidal ideation. No hallucinations. Has not taken any for symptoms. Seems to be progressively worsening.   Past Medical History:  Diagnosis Date  . Alcohol abuse   . Depression   . Liver problem    "reversible if quits drining"  . Pancreatitis     Patient Active Problem List   Diagnosis Date Noted  . Acute alcoholic hepatitis 07/20/2016  . 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 Acute gallstone pancreatitis 07/17/2016  . Transaminitis 07/17/2016  . Leukocytosis 07/17/2016  . Hyponatremia 07/17/2016  . ETOH abuse 07/17/2016  . Tobacco abuse 07/17/2016    History reviewed. No pertinent surgical history.  Current Outpatient Rx  . Order #: [id number] Class: Historical Med  . Order #: [id number] Class: Historical Med  . Order #: [id number] Class: Normal  . Order #: [id number] Class: Historical Med  . Order #: [id number] Class: Print    Allergies Patient has no known allergies.  History reviewed. No pertinent family history.  Social History Social History  Substance Use  Topics  . Smoking status: Current Every Day Smoker    Packs/day: 1.00    Types: Cigarettes    Start date: 08/04/2006  . Smokeless tobacco: Never Used  . Alcohol use 60.0 oz/week    100 Cans of beer per week     Comment: daily    Review of Systems  All other systems negative except as documented in the HPI. All pertinent positives and negatives as reviewed in the HPI. ____________________________________________   PHYSICAL EXAM:  VITAL SIGNS: ED Triage Vitals  Enc Vitals Group     BP 11/15/16 0700 (!) 114/91     Pulse Rate 11/15/16 0700 (!) 109     Resp 11/15/16 0700 18     Temp 11/15/16 0700 98.6 F (37 C)     Temp Source 11/15/16 0700 Oral     SpO2 11/15/16 0700 100 %     Pain Score 11/15/16 0657 10    Constitutional: Alert and oriented. Well appearing and in no acute distress. Eyes: Conjunctivae are normal. PERRL. EOMI. Head: Atraumatic. Nose: No congestion/rhinnorhea. Mouth/Throat: Mucous membranes are moist.  Oropharynx non-erythematous. Neck: No stridor.  No meningeal signs.   Cardiovascular: tachycardic rate, regular rhythm. Good peripheral circulation. Grossly normal heart sounds.   Respiratory: Normal respiratory effort.  No retractions. Lungs CTAB. Gastrointestinal: Soft and nontender. No distention.  Musculoskeletal: No lower extremity tenderness nor edema. No gross deformities of extremities. Neurologic:  Normal speech and language. No gross focal neurologic deficits are appreciated. Significant hand tremor.  Skin:  Skin is warm, dry and intact. No rash noted.  ____________________________________________   LABS (all labs ordered are listed, but only abnormal results are displayed)  Labs Reviewed  CBC WITH DIFFERENTIAL/PLATELET - Abnormal; Notable for the following:       Result Value   Hemoglobin 12.4 (*)    HCT 34.8 (*)    Platelets 143 (*)    All other components within normal limits  COMPREHENSIVE METABOLIC PANEL - Abnormal; Notable for the  following:    Sodium 128 (*)    Potassium 3.2 (*)    Chloride 83 (*)    Glucose, Bld 145 (*)    AST 231 (*)    ALT 78 (*)    Alkaline Phosphatase 279 (*)    Total Bilirubin 6.3 (*)    All other components within normal limits  URINALYSIS, ROUTINE W REFLEX MICROSCOPIC - Abnormal; Notable for the following:    Color, Urine AMBER (*)    APPearance HAZY (*)    Specific Gravity, Urine 1.031 (*)    Glucose, UA 50 (*)    Bilirubin Urine MODERATE (*)    Ketones, ur 5 (*)    Protein, ur 30 (*)    Squamous Epithelial / LPF 0-5 (*)    All other components within normal limits  LIPASE, BLOOD  RAPID URINE DRUG SCREEN, HOSP PERFORMED   ____________________________________________  RADIOLOGY  No results found.  ____________________________________________   PROCEDURES  Procedure(s) performed:   Procedures   ____________________________________________   INITIAL IMPRESSION / ASSESSMENT AND PLAN / ED COURSE  Pertinent labs & imaging results that were available during my care of the patient were reviewed by me and considered in my medical decision making (see chart for details).  Patient here and clinical alcohol withdrawal likely associated with dehydration. Improved with Ativan. Heart rate improved with Ativan and fluids. Slightly low sodium and potassium which were repleted here. Will follow-up with GI (I discussed on the phone with Dr. Karilyn Cota) for improvement in his liver enzymes. Prescription for Ativan given for acute physiologic alcohol withdrawal at home. States she had a seizure in the past with alcohol withdrawal however the last couple times he has had withdrawal he has not showed any evidence of delirium tremenssocial attempt out of possible withdrawal again. Resources given to help with counseling and psychiatry care.  ____________________________________________  FINAL CLINICAL IMPRESSION(S) / ED DIAGNOSES  Final diagnoses:  Elevated liver enzymes     MEDICATIONS  GIVEN DURING THIS VISIT:  Medications  sodium chloride 0.9 % bolus 1,000 mL (0 mLs Intravenous Stopped 11/15/16 0843)  LORazepam (ATIVAN) injection 1 mg (1 mg Intravenous Given 11/15/16 0738)  ondansetron (ZOFRAN) injection 4 mg (4 mg Intravenous Given 11/15/16 0851)  potassium chloride 10 mEq in 100 mL IVPB (0 mEq Intravenous Stopped 11/15/16 1030)  potassium chloride SA (K-DUR,KLOR-CON) CR tablet 40 mEq (40 mEq Oral Given 11/15/16 0851)  sodium chloride 0.9 % bolus 1,000 mL (1,000 mLs Intravenous New Bag/Given 11/15/16 1221)  LORazepam (ATIVAN) tablet 1 mg (1 mg Oral Given 11/15/16 1327)     NEW OUTPATIENT MEDICATIONS STARTED DURING THIS VISIT:  Current Discharge Medication List      Note:  This document was prepared using Dragon voice recognition software and may include unintentional dictation errors.   Marily Memos, MD 11/15/16 [phone]

## 2016-11-15 NOTE — ED Triage Notes (Signed)
Pt reports being very depressed, not sleeping, not eating.  Has been drinking every day and has not drank in 24 hours.  States he has been shaking badly.

## 2016-11-24 ENCOUNTER — Ambulatory Visit (INDEPENDENT_AMBULATORY_CARE_PROVIDER_SITE_OTHER): Payer: Self-pay | Admitting: Internal Medicine

## 2016-11-29 ENCOUNTER — Encounter (INDEPENDENT_AMBULATORY_CARE_PROVIDER_SITE_OTHER): Payer: Self-pay | Admitting: Internal Medicine

## 2016-12-01 ENCOUNTER — Encounter (HOSPITAL_COMMUNITY): Payer: Self-pay | Admitting: *Deleted

## 2016-12-01 ENCOUNTER — Emergency Department (HOSPITAL_COMMUNITY)
Admission: EM | Admit: 2016-12-01 | Discharge: 2016-12-02 | Disposition: A | Discharge status: Home / Self Care | Payer: Self-pay | Attending: Emergency Medicine | Admitting: Emergency Medicine

## 2016-12-01 DIAGNOSIS — F10230 Alcohol dependence with withdrawal, uncomplicated: Secondary | ICD-10-CM | POA: Insufficient documentation

## 2016-12-01 DIAGNOSIS — F10930 Alcohol use, unspecified with withdrawal, uncomplicated: Secondary | ICD-10-CM

## 2016-12-01 DIAGNOSIS — F333 Major depressive disorder, recurrent, severe with psychotic symptoms: Secondary | ICD-10-CM | POA: Insufficient documentation

## 2016-12-01 DIAGNOSIS — E860 Dehydration: Secondary | ICD-10-CM | POA: Insufficient documentation

## 2016-12-01 DIAGNOSIS — Z79899 Other long term (current) drug therapy: Secondary | ICD-10-CM | POA: Insufficient documentation

## 2016-12-01 DIAGNOSIS — F17210 Nicotine dependence, cigarettes, uncomplicated: Secondary | ICD-10-CM | POA: Insufficient documentation

## 2016-12-01 LAB — CBC WITH DIFFERENTIAL/PLATELET
Basophils Absolute: 0.1 10*3/uL (ref 0.0–0.1)
Basophils Relative: 1 %
EOS ABS: 0 10*3/uL (ref 0.0–0.7)
EOS PCT: 0 %
HCT: 39.1 % (ref 39.0–52.0)
Hemoglobin: 13.7 g/dL (ref 13.0–17.0)
LYMPHS ABS: 1.9 10*3/uL (ref 0.7–4.0)
Lymphocytes Relative: 34 %
MCH: 29.9 pg (ref 26.0–34.0)
MCHC: 35 g/dL (ref 30.0–36.0)
MCV: 85.4 fL (ref 78.0–100.0)
MONO ABS: 0.2 10*3/uL (ref 0.1–1.0)
Monocytes Relative: 4 %
Neutro Abs: 3.4 10*3/uL (ref 1.7–7.7)
Neutrophils Relative %: 61 %
PLATELETS: 440 10*3/uL — AB (ref 150–400)
RBC: 4.58 MIL/uL (ref 4.22–5.81)
RDW: 15.5 % (ref 11.5–15.5)
WBC: 5.6 10*3/uL (ref 4.0–10.5)

## 2016-12-01 LAB — COMPREHENSIVE METABOLIC PANEL
ALBUMIN: 4 g/dL (ref 3.5–5.0)
ALT: 55 U/L (ref 17–63)
AST: 82 U/L — AB (ref 15–41)
Alkaline Phosphatase: 174 U/L — ABNORMAL HIGH (ref 38–126)
Anion gap: 17 — ABNORMAL HIGH (ref 5–15)
BILIRUBIN TOTAL: 1 mg/dL (ref 0.3–1.2)
BUN: 14 mg/dL (ref 6–20)
CHLORIDE: 89 mmol/L — AB (ref 101–111)
CO2: 27 mmol/L (ref 22–32)
CREATININE: 0.89 mg/dL (ref 0.61–1.24)
Calcium: 8.6 mg/dL — ABNORMAL LOW (ref 8.9–10.3)
GFR calc Af Amer: >60 mL/min (ref >60)
GLUCOSE: 261 mg/dL — AB (ref 65–99)
Potassium: 4.4 mmol/L (ref 3.5–5.1)
Sodium: 133 mmol/L — ABNORMAL LOW (ref 135–145)
TOTAL PROTEIN: 7.4 g/dL (ref 6.5–8.1)

## 2016-12-01 LAB — RAPID URINE DRUG SCREEN, HOSP PERFORMED
Amphetamines: NOT DETECTED
Barbiturates: NOT DETECTED
Benzodiazepines: NOT DETECTED
COCAINE: NOT DETECTED
OPIATES: NOT DETECTED
Tetrahydrocannabinol: NOT DETECTED

## 2016-12-01 LAB — ETHANOL: Alcohol, Ethyl (B): 304 mg/dL (ref <10)

## 2016-12-01 LAB — LIPASE, BLOOD: Lipase: 14 U/L (ref 11–51)

## 2016-12-01 MED ORDER — LORAZEPAM 2 MG/ML IJ SOLN
0.0000 mg | Freq: Four times a day (QID) | INTRAMUSCULAR | Status: DC
Start: 2016-12-01 — End: 2016-12-02
  Administered 2016-12-01 – 2016-12-02 (×3): 2 mg via INTRAVENOUS
  Filled 2016-12-01 (×3): qty 1

## 2016-12-01 MED ORDER — LORAZEPAM 1 MG PO TABS: 0.0000 mg | ORAL_TABLET | Freq: Four times a day (QID) | ORAL | Status: DC

## 2016-12-01 MED ORDER — LORAZEPAM 2 MG/ML IJ SOLN: 0.0000 mg | Freq: Two times a day (BID) | INTRAMUSCULAR | Status: DC

## 2016-12-01 MED ORDER — ACETAMINOPHEN 325 MG PO TABS
650.0000 mg | ORAL_TABLET | ORAL | Status: DC | PRN
  Administered 2016-12-02: 650 mg via ORAL
  Filled 2016-12-01: qty 2

## 2016-12-01 MED ORDER — SODIUM CHLORIDE 0.9 % IV BOLUS (SEPSIS)
1000.0000 mL | Freq: Once | INTRAVENOUS | Status: AC
  Administered 2016-12-01: 1000 mL via INTRAVENOUS

## 2016-12-01 MED ORDER — NICOTINE 14 MG/24HR TD PT24
14.0000 mg | MEDICATED_PATCH | Freq: Once | TRANSDERMAL | Status: DC
  Administered 2016-12-01: 14 mg via TRANSDERMAL
  Filled 2016-12-01: qty 1

## 2016-12-01 MED ORDER — THIAMINE HCL 100 MG/ML IJ SOLN
100.0000 mg | Freq: Every day | INTRAMUSCULAR | Status: DC
  Administered 2016-12-01: 100 mg via INTRAVENOUS
  Filled 2016-12-01: qty 2

## 2016-12-01 MED ORDER — LORAZEPAM 2 MG/ML IJ SOLN
0.5000 mg | Freq: Once | INTRAMUSCULAR | Status: AC
  Administered 2016-12-01: 0.5 mg via INTRAVENOUS
  Filled 2016-12-01: qty 1

## 2016-12-01 MED ORDER — LORAZEPAM 2 MG/ML IJ SOLN
1.0000 mg | Freq: Once | INTRAMUSCULAR | Status: AC
  Administered 2016-12-01: 1 mg via INTRAVENOUS
  Filled 2016-12-01: qty 1

## 2016-12-01 MED ORDER — ONDANSETRON HCL 4 MG/2ML IJ SOLN
4.0000 mg | Freq: Once | INTRAMUSCULAR | Status: AC
  Administered 2016-12-01: 4 mg via INTRAVENOUS
  Filled 2016-12-01: qty 2

## 2016-12-01 MED ORDER — ONDANSETRON HCL 4 MG/2ML IJ SOLN
4.0000 mg | Freq: Four times a day (QID) | INTRAMUSCULAR | Status: DC | PRN
  Administered 2016-12-01 – 2016-12-02 (×3): 4 mg via INTRAVENOUS
  Filled 2016-12-01 (×3): qty 2

## 2016-12-01 MED ORDER — VITAMIN B-1 100 MG PO TABS
100.0000 mg | ORAL_TABLET | Freq: Every day | ORAL | Status: DC
  Administered 2016-12-02: 100 mg via ORAL
  Filled 2016-12-01: qty 1

## 2016-12-01 MED ORDER — LORAZEPAM 1 MG PO TABS
0.0000 mg | ORAL_TABLET | Freq: Two times a day (BID) | ORAL | Status: DC
Start: 2016-12-04 — End: 2016-12-02

## 2016-12-01 NOTE — ED Provider Notes (Signed)
[facility] EMERGENCY DEPARTMENT Provider Note   CSN: [id number] Arrival date & time: 12/01/16  0546     History   Chief Complaint Chief Complaint  Patient presents with  . V70.1    HPI Shawn Watkins is a 36 y.o. male.  The history is provided by the patient.  Emesis   This is a new problem. The current episode started 12 to 24 hours ago. The problem has been gradually worsening. There has been no fever. Associated symptoms include chills and myalgias.  Patient with h/o liver disease, ETOH abuse presents with concern for alcohol withdrawal.  He reports he has been drinking heavily and abruptly stopped yesterday Since then he has had worsening tremors, nausea/vomiting.  He is concerned he may have a seizure, though he has not had one as of yet.   He also reports myalgias and abdominal pain This feel similar to prior episodes of ETOH withdrawal Denies SI at this time Past Medical History:  Diagnosis Date  . Alcohol abuse   . Depression   . Liver problem    "reversible if quits drining"  . Pancreatitis     Patient Active Problem List   Diagnosis Date Noted  . Acute alcoholic hepatitis 07/20/2016  . 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 Acute gallstone pancreatitis 07/17/2016  . Transaminitis 07/17/2016  . Leukocytosis 07/17/2016  . Hyponatremia 07/17/2016  . ETOH abuse 07/17/2016  . Tobacco abuse 07/17/2016    History reviewed. No pertinent surgical history.     Home Medications    Prior to Admission medications   Medication Sig Start Date End Date Taking? Authorizing Provider  ibuprofen (ADVIL,MOTRIN) 200 MG tablet Take 400-600 mg by mouth every 6 (six) hours as needed for moderate pain.   Yes [provider]  Saccharomyces boulardii (PROBIOTIC) 250 MG CAPS Take 1 capsule by mouth daily.   Yes [provider]  LORazepam (ATIVAN) 1 MG tablet Take 1 tablet (1 mg total) by mouth 3 (three) times daily as needed for anxiety. 11/15/16   Mesner,  Barbara Cower, MD  Multiple Vitamin (MULTIVITAMIN) capsule Take 1 capsule by mouth daily.    [provider]  ondansetron (ZOFRAN) 4 MG tablet Take 1 tablet (4 mg total) by mouth every 8 (eight) hours as needed for nausea or vomiting. 11/15/16   Mesner, Barbara Cower, MD  pantoprazole (PROTONIX) 40 MG tablet Take 1 tablet (40 mg total) by mouth daily. 08/03/16   Wendall Stade, MD  potassium chloride SA (K-DUR,KLOR-CON) 20 MEQ tablet Take 2 tablets (40 mEq total) by mouth 2 (two) times daily. 11/15/16   Mesner, Barbara Cower, MD    Family History No family history on file.  Social History Social History  Substance Use Topics  . Smoking status: Current Every Day Smoker    Packs/day: 1.00    Types: Cigarettes    Start date: 08/04/2006  . Smokeless tobacco: Never Used  . Alcohol use 60.0 oz/week    100 Cans of beer per week     Comment: daily     Allergies   Patient has no known allergies.   Review of Systems Review of Systems  Constitutional: Positive for chills and fatigue.  Respiratory: Positive for chest tightness.   Gastrointestinal: Positive for vomiting.  Musculoskeletal: Positive for myalgias.  Psychiatric/Behavioral: Negative for suicidal ideas.  All other systems reviewed and are negative.    Physical Exam Updated Vital Signs BP (!) 121/94 (BP Location: Left Arm)   Pulse (!) 101   Temp 98.3  F (36.8 C) (Oral)   Resp 20   SpO2 97%   Physical Exam CONSTITUTIONAL: disheveled, anxious HEAD: Normocephalic/atraumatic EYES: EOMI/PERRL ENMT: Mucous membranes dry NECK: supple no meningeal signs SPINE/BACK:entire spine nontender CV: S1/S2 noted, no murmurs/rubs/gallops noted LUNGS: Lungs are clear to auscultation bilaterally, no apparent distress ABDOMEN: soft, nontender, no rebound or guarding, bowel sounds noted throughout abdomen GU:no cva tenderness NEURO: Pt is awake/alert/appropriate, moves all extremitiesx4.  No facial droop.  Tremor noted in upper extremities.  No focal  motor deficits noted EXTREMITIES: pulses normal/equal, full ROM SKIN: warm, color normal PSYCH: anxious  ED Treatments / Results  Labs (all labs ordered are listed, but only abnormal results are displayed) Labs Reviewed  COMPREHENSIVE METABOLIC PANEL - Abnormal; Notable for the following:       Result Value   Sodium 133 (*)    Chloride 89 (*)    Glucose, Bld 261 (*)    Calcium 8.6 (*)    AST 82 (*)    Alkaline Phosphatase 174 (*)    Anion gap 17 (*)    All other components within normal limits  CBC WITH DIFFERENTIAL/PLATELET - Abnormal; Notable for the following:    Platelets 440 (*)    All other components within normal limits  ETHANOL - Abnormal; Notable for the following:    Alcohol, Ethyl (B) 304 (*)    All other components within normal limits  LIPASE, BLOOD  RAPID URINE DRUG SCREEN, HOSP PERFORMED    EKG  EKG Interpretation None       Radiology No results found.  Procedures Procedures   Medications Ordered in ED Medications  sodium chloride 0.9 % bolus 1,000 mL (1,000 mLs Intravenous New Bag/Given 12/01/16 0725)  LORazepam (ATIVAN) injection 1 mg (1 mg Intravenous Given 12/01/16 0725)     Initial Impression / Assessment and Plan / ED Course  I have reviewed the triage vital signs and the nursing notes.  Pertinent labs  results that were available during my care of the patient were reviewed by me and considered in my medical decision making (see chart for details).     6:48 AM Pt in the ED for ETOH withdrawal Will treat with IV fluids and ativan 7:37 AM CIWA score at 21 per nursing Pt still tremulous Plan at signout to dr ray, reassess patient after meds If symptoms persist he may need admission   Final Clinical Impressions(s) / ED Diagnoses   Final diagnoses:  Alcohol withdrawal syndrome without complication (HCC)  Dehydration    New Prescriptions New Prescriptions   No medications on file     Zadie RhineWickline, Khiree Bukhari, MD 12/01/16 [phone]

## 2016-12-01 NOTE — ED Notes (Signed)
CRITICAL VALUE ALERT  Critical Value:  etoh 304  Date & Time Notied:  12/01/2016@07 :17  Provider Notified: Dr Bebe ShaggyWickline  Orders Received/Actions taken: no additional orders given,

## 2016-12-01 NOTE — BH Assessment (Signed)
Tele Assessment Note   Patient Name: Shawn Watkins MRN: [id number] Referring Physician: Margarita Grizzleay, Danielle, MD [facility] Location of Provider: Behavioral Health TTS Department  Shawn Watkins is an 36 y.o. male present to [facility] complaining of symptoms associated with alcohol withdrawal and depression. Patient denies SI and HI. Patient report visual and auditory hallucinations. Patient report discontinue use of alcohol three days ago. Report withdrawal symptoms such as shakes, stomach pain, tremors and severe anxiety. Report hearing consist ringing in his ears and see light spot flashes. Report no sleep in four days and no appetite in three days. Denies medication management or outpatient therapy services. Patient report 10-year alcohol dependence with two year sobriety 2015-2017. Patient report summer 2018 sober for 70-days until relapse Aug. 2018. Patient report being scared of seizures associated with withdrawals.   Patient denies history of abuse and criminal history. Patient lives with his mother who is identified as a support. Patient report past history of alcohol withdrawal, stating, "this is the worse I have ever felt, however, its my fault I should not have returned to drinking but its so hard. I'm determined to beat the disease. I am not going to die 36 year old due to alcohol dependency." Patient denies access to weapons.   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 Alcohol abuse   . Depression   . Liver problem    "reversible if quits drining"  . Pancreatitis     History reviewed. No pertinent surgical history.  Family History: No family history on file.  Social History:   reports that he has been smoking Cigarettes.  He started smoking about 10 years ago. He has been smoking about 1.00 pack per day. He has never used smokeless tobacco. He reports that he drinks about 60.0 oz of alcohol per week . He reports that he does not use drugs.  Additional Social History:  Alcohol / Drug Use Pain Medications: see MAR Prescriptions: see MAR Over the Counter: see MAR History of alcohol / drug use?: Yes Longest period of sobriety (when/how long): 2 years 2015 - 2017 Substance #1 Name of Substance 1: Alcohol 1 - Age of First Use: 17 1 - Amount (size/oz): varies 1 - Frequency: daily 1 - Duration: ongoing 1 - Last Use / Amount: yesterday 12/01/2016  CIWA: CIWA-Ar BP: 121/86 Pulse Rate: 82 Nausea and Vomiting: 2 Tactile Disturbances: very mild itching, pins and needles, burning or numbness Tremor: two Auditory Disturbances: not present Paroxysmal Sweats: no sweat visible Visual Disturbances: not present Anxiety: five Headache, Fullness in Head: moderately severe Agitation: normal activity Orientation and Clouding of Sensorium: oriented and can do serial additions CIWA-Ar Total: 14 COWS:    PATIENT STRENGTHS: (choose at least two) Ability for insight Average or above average intelligence  Allergies: No Known Allergies  Home Medications:  (Not in a hospital admission)  OB/GYN Status:  No LMP for male patient.  General Assessment Data Location of Assessment: [facility] TTS Assessment: In system Is this a Tele or Face-to-Face Assessment?: Tele Assessment Is this an Initial Assessment or a Re-assessment for this encounter?: Initial Assessment Marital status: Single Maiden name: n/a Is patient pregnant?: No Pregnancy Status: No Living Arrangements: Parent Can pt return to current living arrangement?: Yes Admission Status:  Voluntary Is patient capable of signing voluntary admission?: Yes Referral Source: Self/Family/Friend Insurance type: no insurance      Crisis Care Plan Living Arrangements: Parent Legal Guardian: Other: (self) Name of Psychiatrist: none reported Name of Therapist: none reported  Education Status Is patient currently in school?: No Highest grade of school patient has completed: 12 th  Risk to self with the past 6 months Suicidal Ideation: No Has patient been a risk to self within the past 6 months prior to admission? : No Suicidal Intent: No Has patient had any suicidal intent within the past 6 months prior to admission? : No Is patient at risk for suicide?: No Suicidal Plan?: No Has patient had any suicidal plan within the past 6 months prior to admission? : No Access to Means: No What has been your use of drugs/alcohol within the last 12 months?: alcohol Previous Attempts/Gestures: No How many times?: 0 Other Self Harm Risks: none reported Triggers for Past Attempts: None known Intentional Self Injurious Behavior: None Family Suicide History: No Persecutory voices/beliefs?: Yes Depression: Yes Depression Symptoms: Tearfulness, Guilt, Feeling worthless/self pity, Feeling angry/irritable Substance abuse history and/or treatment for substance abuse?: Yes  Risk to Others within the past 6 months Homicidal Ideation: No Does patient have any lifetime risk of violence toward others beyond the six months prior to admission? : No Thoughts of Harm to Others: No Current Homicidal Intent: No Current Homicidal Plan: No Access to Homicidal Means: No Identified Victim: none reported History of harm to others?: No Assessment of Violence: None Noted Violent Behavior Description: non reported Does patient have access to weapons?: No Criminal Charges Pending?: No Does patient have a court date: No Is patient on probation?: No  Psychosis Hallucinations: Auditory, Visual (pt report seeing and hearing things) Delusions: None noted  Mental Status Report Appearance/Hygiene: In scrubs Eye Contact: Good Motor  Activity: Freedom of movement Speech: Logical/coherent Level of Consciousness: Crying Mood: Depressed, Fearful, Sad Affect: Anxious, Depressed, Fearful, Sad Anxiety Level: Severe (pt report given Ativan for anxiety ) Thought Processes: Circumstantial (depression and alcohol withdrawal) Judgement: Partial (depression and alcohol withdrawal) Orientation: Person, Place, Time, Situation, Appropriate for developmental age Obsessive Compulsive Thoughts/Behaviors: None  Cognitive Functioning Concentration: Good Memory: Recent Intact, Remote Intact IQ: Average Insight: Fair (drink alcohol to deal with stress) Impulse Control: Poor Appetite: Poor (report has not eaten in 3 days) Sleep: Decreased (report no sleep in 4 days) Total Hours of Sleep: 0 Vegetative Symptoms: None  ADLScreening [facility] Assessment Services) Patient's cognitive ability adequate to safely complete daily activities?: Yes Patient able to express need for assistance with ADLs?: Yes Independently performs ADLs?: Yes (appropriate for developmental age)  Prior Inpatient Therapy Prior Inpatient Therapy: No  Prior Outpatient Therapy Prior Outpatient Therapy: No Does patient have an ACCT team?: No Does patient have Intensive In-House Services?  : No Does patient have [facility] services? : No Does patient have P4CC services?: No  ADL Screening (condition at time of admission) Patient's cognitive ability adequate to safely complete daily activities?: Yes Is the patient deaf or have difficulty hearing?: No Does the patient have difficulty seeing, even when wearing glasses/contacts?: No Does the patient have difficulty concentrating, remembering, or making decisions?: No Patient able to express need for assistance with ADLs?: Yes Does the patient have difficulty dressing or bathing?: No Independently performs ADLs?: Yes (appropriate for developmental age) Does the patient have difficulty walking or climbing stairs?: No  Advance Directives (For Healthcare) Does Patient Have a Medical Advance Directive?: No    Additional Information 1:1 In Past 12 Months?: No CIRT Risk: No Elopement Risk: No Does patient have medical clearance?: No     Disposition: Shawn Rankin, NP, patient meets criteria for inpatient.  Disposition Initial Assessment Completed for this Encounter: Yes Disposition of Patient: Inpatient treatment program Type of inpatient treatment program: Adult  Shawn Watkins 12/01/2016 11:05 AM

## 2016-12-01 NOTE — ED Notes (Signed)
Pt aware of need for urine sample. Pt states that he cannot provide one at this time, but will as soon as able.

## 2016-12-01 NOTE — ED Notes (Signed)
Pt given breakfast tray

## 2016-12-01 NOTE — ED Notes (Signed)
Pt denies SI/HI ideations

## 2016-12-01 NOTE — [facility] Counselor (Signed)
Disposition: Per Assunta FoundShuvon Rankin, NP, patient meet criteria for inpatient. TTS to seek placement.

## 2016-12-01 NOTE — ED Notes (Signed)
[facility]

## 2016-12-01 NOTE — ED Notes (Signed)
Pt reminded of need for urine sample. States he still cannot provide one at this time.

## 2016-12-01 NOTE — ED Notes (Signed)
Per Dr. Preston FleetingGlick pt can get 2mg  Ativan per CIWA protocol, recheck pt in 30 minutes to see if there are signs of improvement. If no improvement repeat 2mg  Ativan order.

## 2016-12-01 NOTE — ED Triage Notes (Signed)
Pt returns to er requesting detox for alcohol, pt states that he last had a drink yesterday, normally drinks daily, c/o generalized body aches, n/v, tremors, diarrhea. abd pain.

## 2016-12-01 NOTE — ED Notes (Signed)
Pt ate lunch tray and now is complaining of feeling sick and tremors from the alcohol. Requesting medications

## 2016-12-02 MED ORDER — CHLORDIAZEPOXIDE HCL 25 MG PO CAPS: ORAL_CAPSULE | ORAL | 0 refills | Status: DC

## 2016-12-02 NOTE — ED Notes (Signed)
[facility] made aware of pt's discharge to home.

## 2016-12-02 NOTE — Discharge Instructions (Signed)
[facility] or another out pt treatment for alcohol

## 2017-01-29 ENCOUNTER — Emergency Department (HOSPITAL_COMMUNITY)
Admission: EM | Admit: 2017-01-29 | Discharge: 2017-01-29 | Disposition: A | Discharge status: Home / Self Care | Payer: Self-pay | Attending: Emergency Medicine | Admitting: Emergency Medicine

## 2017-01-29 ENCOUNTER — Encounter (HOSPITAL_COMMUNITY): Payer: Self-pay | Admitting: *Deleted

## 2017-01-29 DIAGNOSIS — F10930 Alcohol use, unspecified with withdrawal, uncomplicated: Secondary | ICD-10-CM

## 2017-01-29 DIAGNOSIS — Z79899 Other long term (current) drug therapy: Secondary | ICD-10-CM | POA: Insufficient documentation

## 2017-01-29 DIAGNOSIS — F1010 Alcohol abuse, uncomplicated: Secondary | ICD-10-CM

## 2017-01-29 DIAGNOSIS — F10230 Alcohol dependence with withdrawal, uncomplicated: Secondary | ICD-10-CM | POA: Insufficient documentation

## 2017-01-29 DIAGNOSIS — F17210 Nicotine dependence, cigarettes, uncomplicated: Secondary | ICD-10-CM | POA: Insufficient documentation

## 2017-01-29 LAB — COMPREHENSIVE METABOLIC PANEL
ALBUMIN: 4.8 g/dL (ref 3.5–5.0)
ALT: 104 U/L — AB (ref 17–63)
AST: 36 U/L (ref 15–41)
Alkaline Phosphatase: 658 U/L — ABNORMAL HIGH (ref 38–126)
Anion gap: 16 — ABNORMAL HIGH (ref 5–15)
BUN: 6 mg/dL (ref 6–20)
CHLORIDE: 95 mmol/L — AB (ref 101–111)
CO2: 26 mmol/L (ref 22–32)
Calcium: 9.1 mg/dL (ref 8.9–10.3)
Creatinine, Ser: 0.72 mg/dL (ref 0.61–1.24)
GFR calc Af Amer: >60 mL/min (ref >60)
GLUCOSE: 357 mg/dL — AB (ref 65–99)
POTASSIUM: 4.2 mmol/L (ref 3.5–5.1)
SODIUM: 137 mmol/L (ref 135–145)
Total Bilirubin: 1.2 mg/dL (ref 0.3–1.2)
Total Protein: 7.8 g/dL (ref 6.5–8.1)

## 2017-01-29 LAB — CBC
HEMATOCRIT: 40.4 % (ref 39.0–52.0)
HEMOGLOBIN: 13.8 g/dL (ref 13.0–17.0)
MCH: 29.5 pg (ref 26.0–34.0)
MCHC: 34.2 g/dL (ref 30.0–36.0)
MCV: 86.3 fL (ref 78.0–100.0)
Platelets: 375 10*3/uL (ref 150–400)
RBC: 4.68 MIL/uL (ref 4.22–5.81)
RDW: 12.3 % (ref 11.5–15.5)
WBC: 7.2 10*3/uL (ref 4.0–10.5)

## 2017-01-29 LAB — ETHANOL: ALCOHOL ETHYL (B): 259 mg/dL — AB (ref <10)

## 2017-01-29 MED ORDER — CHLORDIAZEPOXIDE HCL 25 MG PO CAPS: ORAL_CAPSULE | ORAL | 0 refills | Status: DC

## 2017-01-29 MED ORDER — CHLORDIAZEPOXIDE HCL 25 MG PO CAPS
50.0000 mg | ORAL_CAPSULE | Freq: Once | ORAL | Status: AC
  Administered 2017-01-29: 50 mg via ORAL
  Filled 2017-01-29: qty 2

## 2017-01-29 NOTE — Discharge Instructions (Signed)
Substance Abuse Treatment Programs ° °Intensive Outpatient Programs °[facility]     °[address]      °[place], [place]                   °[phone]      ° °[facility] °[address] °[place], [place] °[phone] ° °[facility]     °(Inpatient and outpatient)     °[address]           °[phone]   ° °[facility] °[phone] (Suboxone and Methadone) ° °[address]      °[place], [place] [address]      °[phone]      ° °[address] °[place], [place] °852-3033 ° °[facility] (Outpatient/Inpatient, [facility])    °(insurance only) [phone]      °       °[facility]Groups & Residential) °[place], [place] °[phone] ° °   °[facility]     °[address]     °[place], [place]      °[phone]      ° °[facility] (for caregivers and family) °[address] °[place], [place] °336-299-4655 ° ° ° ° ° °Residential Treatment Programs °[facility]      °[address], [place], [place] [address]  °([phone]      ° °[facility] °[address] [place], [place] [address] °[phone] ° °[facility]        °[phone]      ° °[facility] °1-800-659-3381 ° °[facility]             °[address]                                         °[place], [place]                                                °877-615-2722 or [phone]                              ° °[facility] [place] °[address] °[place], [address] °[phone] ° °[facility]    °[address]      °[place], [place]     °[phone]      ° °[facility] °[address] °, [place] °336-285-9073 ° °[facility]   °[address]     °[place], [place] [address]     °[phone]      °Admissions: 8am-3pm M-F ° °[facility] °[address] °[place],  [place] °336-227-7417 ° °[facility]: Residential Program (90 Days)   °[place], [place]      °336-725-8389 or 800-758-6077    ° °[facility] °[place], [place] °(Walk in Hours over the weekend or by referral) ° °[facility] °[address], [place], [place] [address] °([phone] ° °Crisis Mobile: [facility]:  1-877-626-1772 (for crisis response 24 hours a day) °[facility] Hotline:      1-800-256-2452 °Outpatient Psychiatry and Counseling ° °[facility]: Mobile Crisis   Management 24 hours:  1-877-626-1772 ° °[facility] sliding scale fee and walk in schedule: M-F 8am-12pm/1pm-3pm °[address]  °[place], [place] [address] °[phone] ° °[facility] °[address] °[place], [place] [address] °[phone] ° °[facility] ([facility])- new patient walk-in appointments available Monday - Friday 8am -3pm.          °[address] °[place], [place] [address] °336-676-6840 or crisis line- 336-676-6905 ° °[facility] Intensive Outpatient Therapy Program °[address] °[place], [place] [address] °336-832-9804 ° °[facility]                  °Crisis Services      °[phone]      °[address]     °[place], [place] [address]                ° °[facility]   °[facility] °[phone] °[address] °[place], [place] [address] ° ° °[facility]          °[address]  °[place], [place] [address]       °([phone] ° °[facility] °[address] °[place], [place] [address] °336-292-1510 ° °[facility]    °[address]    °[place], [place] [address]     °[phone]      ° °Parish McKinney, MD     °[address]     °[place] [address]     °[phone]     °  °[facility] °[address] °[place] [address] ° °[facility]     °[address]     °[place], [place]      °[phone]      ° °[facility] °Shamsher Ahluwalia, MD °[address] °[place], [place] [address] °[phone] ° °[facility]     °[address]     °[place], [place] [address]     °336-274-1237      ° °[facility] °[address] °[place], [place] [address] °336-854-4450 °Resources for Temporary Residential Assistance/Crisis Centers ° °[facility] °[facility] °M-F 8am-3pm   °[address] [place], [place] [address]   336-332-0824 °Services include: laundry, barbering, support groups, case management, phone  & computer access, showers, AA/NA mtgs, mental health/substance abuse nurse, job skills class, disability information, VA assistance, spiritual classes, etc.  ° °HOMELESS SHELTERS ° °[facility]     °[facility]   °[address], [place]     °[phone]       °       °[facility] (women and children)       °[address] °[place], [place] [address] °336-275-0820 °[email or web address] for application and process °Application Required ° °[facility]   °[address]    °[place] [address]     °[phone]       °             °[facility] °[address] °[place], [place] [address] °336.273.5572 °336-235-0363(schedule application appt.) °Application Required ° °[facility] (women only)    °[address]     °[place],  [address]     °336-884-1039      °  Intake starts 6pm daily °Need valid ID, SSC, & Police report °[facility] [place] °[address] °[place], [place] °336-881-5420 °Application Required ° °[facility] (men only)     °[address]      °[place], [place]     °[phone]      ° °[facility] °(Pregnant women only) °[address] °[place], [place] °336-275-0206 ° °[facility]      °[address]      °[place], [place] [address]     °[phone]      °       °[facility] °[address] °[place], [place] °336-723-1848 °90 day commitment/SA/Application process ° °[facility](men only)     °[address]     °[place], [place]     °[phone]       °Check-in at 7pm     °       °[facility] °[address] °[place], [place] [address] °336-248-6684 °Men/Women/Women and Children must be there by 7 pm ° °[facility] °[place],  °336-722-8721                ° °

## 2017-01-29 NOTE — ED Notes (Signed)
Pt unable to provide urine at this time.  Given bag lunch.

## 2017-01-29 NOTE — ED Triage Notes (Signed)
Pt denies SI, here for detox from ETOH.  Last drink at 0200.

## 2017-01-29 NOTE — ED Provider Notes (Signed)
[facility] Provider Note   CSN: [id number] Arrival date & time: 01/29/17  1206     History   Chief Complaint Chief Complaint  Patient presents with  . Medical Clearance    HPI Shawn Watkins is a 36 y.o. male.  HPI  36 year old male with a history of alcohol abuse for many years presents requesting detox.  He states he wants to quit but has tried to quit on his own in the past and suffered through alcohol withdrawal.  This was several years ago where he had a seizure.  Last drink was between midnight and 1 AM.  He typically drinks up to a case of beer a day when he is off work.  Sometimes will drink wine.  He states he is already feeling a little bit of shaking and is nervous about going into withdrawal.  He denies any seizures, current vomiting, fevers.  No auditory or visual hallucinations.  He does not feel suicidal or homicidal.  Past Medical History:  Diagnosis Date  . Alcohol abuse   . Depression   . Liver problem    "reversible if quits drining"  . Pancreatitis     Patient Active Problem List   Diagnosis Date Noted  . Acute alcoholic hepatitis 07/20/2016  . 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 Acute gallstone pancreatitis 07/17/2016  . Transaminitis 07/17/2016  . Leukocytosis 07/17/2016  . Hyponatremia 07/17/2016  . ETOH abuse 07/17/2016  . Tobacco abuse 07/17/2016    History reviewed. No pertinent surgical history.     Home Medications    Prior to Admission medications   Medication Sig Start Date End Date Taking? Authorizing Provider  chlordiazePOXIDE (LIBRIUM) 25 MG capsule 50mg  PO TID x 1D, then 25-50mg  PO BID X 1D, then 25-50mg  PO QD X 1D 01/29/17   Pricilla LovelessGoldston, Brier Firebaugh, MD  ibuprofen (ADVIL,MOTRIN) 200 MG tablet Take 400-600 mg by mouth every 6 (six) hours as needed for moderate pain.    [provider]  LORazepam (ATIVAN) 1 MG tablet Take 1 tablet (1 mg total) by mouth 3 (three) times daily as needed for anxiety. Patient  not taking: Reported on 12/01/2016 11/15/16   Mesner, Barbara CowerJason, MD  ondansetron (ZOFRAN) 4 MG tablet Take 1 tablet (4 mg total) by mouth every 8 (eight) hours as needed for nausea or vomiting. Patient not taking: Reported on 12/01/2016 11/15/16   Mesner, Barbara CowerJason, MD  pantoprazole (PROTONIX) 40 MG tablet Take 1 tablet (40 mg total) by mouth daily. Patient not taking: Reported on 12/01/2016 08/03/16   Wendall StadeNishan, Peter C, MD  potassium chloride SA (K-DUR,KLOR-CON) 20 MEQ tablet Take 2 tablets (40 mEq total) by mouth 2 (two) times daily. Patient not taking: Reported on 12/01/2016 11/15/16   Mesner, Barbara CowerJason, MD  Saccharomyces boulardii (PROBIOTIC) 250 MG CAPS Take 1 capsule by mouth daily.    [provider]    Family History History reviewed. No pertinent family history.  Social History Social History   Tobacco Use  . Smoking status: Current Every Day Smoker    Packs/day: 1.00    Types: Cigarettes    Start date: 08/04/2006  . Smokeless tobacco: Never Used  Substance Use Topics  . Alcohol use: Yes    Alcohol/week: 60.0 oz    Types: 100 Cans of beer per week    Comment: daily  . Drug use: No     Allergies   Patient has no known allergies.   Review of Systems Review of Systems  Constitutional: Negative for  fever.  Cardiovascular: Negative for chest pain.  Gastrointestinal: Negative for abdominal pain.  Neurological: Positive for tremors. Negative for seizures.  Psychiatric/Behavioral: Negative for suicidal ideas.  All other systems reviewed and are negative.    Physical Exam Updated Vital Signs BP 125/87   Pulse 95   Temp 98.1 F (36.7 C) (Oral)   Resp 16   Ht 6\' 1"  (1.854 m)   Wt 83.9 kg (185 lb)   SpO2 97%   BMI 24.41 kg/m   Physical Exam  Constitutional: He is oriented to person, place, and time. He appears well-developed and well-nourished.  HENT:  Head: Normocephalic and atraumatic.  Right Ear: External ear normal.  Left Ear: External ear normal.  Nose: Nose  normal.  Eyes: Right eye exhibits no discharge. Left eye exhibits no discharge.  Neck: Neck supple.  Cardiovascular: Regular rhythm and normal heart sounds. Tachycardia present.  HR low 100s  Pulmonary/Chest: Effort normal and breath sounds normal.  Abdominal: Soft. There is no tenderness.  Musculoskeletal: He exhibits no edema.  Neurological: He is alert and oriented to person, place, and time. He displays tremor.  Mild tremor with arm movement, but not at rest  Skin: Skin is warm and dry.  Psychiatric: He is not agitated and not actively hallucinating. He expresses no homicidal and no suicidal ideation.  Nursing note and vitals reviewed.    ED Treatments / Results  Labs (all labs ordered are listed, but only abnormal results are displayed) Labs Reviewed  COMPREHENSIVE METABOLIC PANEL - Abnormal; Notable for the following components:      Result Value   Chloride 95 (*)    Glucose, Bld 357 (*)    ALT 104 (*)    Alkaline Phosphatase 658 (*)    Anion gap 16 (*)    All other components within normal limits  ETHANOL - Abnormal; Notable for the following components:   Alcohol, Ethyl (B) 259 (*)    All other components within normal limits  CBC  RAPID URINE DRUG SCREEN, HOSP PERFORMED    EKG  EKG Interpretation None       Radiology No results found.  Procedures Procedures (including critical care time)  Medications Ordered in ED Medications  chlordiazePOXIDE (LIBRIUM) capsule 50 mg (50 mg Oral Given 01/29/17 1547)     Initial Impression / Assessment and Plan / ED Course  I have reviewed the triage vital signs and the nursing notes.  Pertinent labs & imaging results that were available during my care of the patient were reviewed by me and considered in my medical decision making (see chart for details).     Patient's lab work shows mild LFT abnormalities but he currently has no abdominal pain.  This is likely from chronic alcohol abuse.  He is not suicidal,  homicidal, or appearing psychotic.  Thus, no indication for acute inpatient management either by inpatient detox or admission.  His CIWA is a 6, indicating mild withdrawal.  He was given Librium here and a Librium taper prescription.  Discussed importance of taking this as well as finding outpatient detox.  He was given resources.  Discussed return precautions.  Final Clinical Impressions(s) / ED Diagnoses   Final diagnoses:  Alcohol abuse  Alcohol withdrawal syndrome without complication [facility](HCC)    ED Discharge Orders        Ordered    chlordiazePOXIDE (LIBRIUM) 25 MG capsule     01/29/17 1557       Pricilla LovelessGoldston, Racer Quam, MD 01/29/17 1607

## 2017-02-06 ENCOUNTER — Emergency Department (HOSPITAL_COMMUNITY): Payer: Self-pay

## 2017-02-06 ENCOUNTER — Inpatient Hospital Stay (HOSPITAL_COMMUNITY)
Admission: EM | Admit: 2017-02-06 | Discharge: 2017-02-10 | DRG: 438 | Disposition: A | Discharge status: Home / Self Care | Payer: Self-pay | Attending: Internal Medicine | Admitting: Internal Medicine

## 2017-02-06 ENCOUNTER — Encounter (HOSPITAL_COMMUNITY): Payer: Self-pay

## 2017-02-06 ENCOUNTER — Other Ambulatory Visit: Payer: Self-pay

## 2017-02-06 DIAGNOSIS — R509 Fever, unspecified: Secondary | ICD-10-CM | POA: Diagnosis present

## 2017-02-06 DIAGNOSIS — R740 Nonspecific elevation of levels of transaminase and lactic acid dehydrogenase [LDH]: Secondary | ICD-10-CM

## 2017-02-06 DIAGNOSIS — R7401 Elevation of levels of liver transaminase levels: Secondary | ICD-10-CM | POA: Diagnosis present

## 2017-02-06 DIAGNOSIS — F329 Major depressive disorder, single episode, unspecified: Secondary | ICD-10-CM | POA: Diagnosis present

## 2017-02-06 DIAGNOSIS — R945 Abnormal results of liver function studies: Secondary | ICD-10-CM

## 2017-02-06 DIAGNOSIS — R739 Hyperglycemia, unspecified: Secondary | ICD-10-CM | POA: Diagnosis present

## 2017-02-06 DIAGNOSIS — E119 Type 2 diabetes mellitus without complications: Secondary | ICD-10-CM

## 2017-02-06 DIAGNOSIS — K828 Other specified diseases of gallbladder: Secondary | ICD-10-CM | POA: Diagnosis present

## 2017-02-06 DIAGNOSIS — K760 Fatty (change of) liver, not elsewhere classified: Secondary | ICD-10-CM | POA: Diagnosis present

## 2017-02-06 DIAGNOSIS — K860 Alcohol-induced chronic pancreatitis: Secondary | ICD-10-CM

## 2017-02-06 DIAGNOSIS — R7989 Other specified abnormal findings of blood chemistry: Secondary | ICD-10-CM

## 2017-02-06 DIAGNOSIS — E872 Acidosis, unspecified: Secondary | ICD-10-CM

## 2017-02-06 DIAGNOSIS — K8050 Calculus of bile duct without cholangitis or cholecystitis without obstruction: Secondary | ICD-10-CM

## 2017-02-06 DIAGNOSIS — F10239 Alcohol dependence with withdrawal, unspecified: Secondary | ICD-10-CM | POA: Diagnosis present

## 2017-02-06 DIAGNOSIS — R6521 Severe sepsis with septic shock: Secondary | ICD-10-CM | POA: Diagnosis present

## 2017-02-06 DIAGNOSIS — Z720 Tobacco use: Secondary | ICD-10-CM | POA: Diagnosis present

## 2017-02-06 DIAGNOSIS — K831 Obstruction of bile duct: Secondary | ICD-10-CM | POA: Diagnosis present

## 2017-02-06 DIAGNOSIS — K7010 Alcoholic hepatitis without ascites: Secondary | ICD-10-CM | POA: Diagnosis present

## 2017-02-06 DIAGNOSIS — R6520 Severe sepsis without septic shock: Secondary | ICD-10-CM

## 2017-02-06 DIAGNOSIS — Y907 Blood alcohol level of 200-239 mg/100 ml: Secondary | ICD-10-CM | POA: Diagnosis present

## 2017-02-06 DIAGNOSIS — K861 Other chronic pancreatitis: Secondary | ICD-10-CM | POA: Diagnosis present

## 2017-02-06 DIAGNOSIS — K863 Pseudocyst of pancreas: Secondary | ICD-10-CM | POA: Diagnosis present

## 2017-02-06 DIAGNOSIS — E118 Type 2 diabetes mellitus with unspecified complications: Secondary | ICD-10-CM

## 2017-02-06 DIAGNOSIS — K852 Alcohol induced acute pancreatitis without necrosis or infection: Principal | ICD-10-CM | POA: Diagnosis present

## 2017-02-06 DIAGNOSIS — A419 Sepsis, unspecified organism: Secondary | ICD-10-CM

## 2017-02-06 DIAGNOSIS — Z791 Long term (current) use of non-steroidal anti-inflammatories (NSAID): Secondary | ICD-10-CM

## 2017-02-06 DIAGNOSIS — R000 Tachycardia, unspecified: Secondary | ICD-10-CM | POA: Diagnosis present

## 2017-02-06 DIAGNOSIS — D696 Thrombocytopenia, unspecified: Secondary | ICD-10-CM | POA: Diagnosis present

## 2017-02-06 DIAGNOSIS — F1010 Alcohol abuse, uncomplicated: Secondary | ICD-10-CM | POA: Diagnosis present

## 2017-02-06 DIAGNOSIS — E860 Dehydration: Secondary | ICD-10-CM | POA: Diagnosis present

## 2017-02-06 DIAGNOSIS — F17210 Nicotine dependence, cigarettes, uncomplicated: Secondary | ICD-10-CM | POA: Diagnosis present

## 2017-02-06 DIAGNOSIS — E1165 Type 2 diabetes mellitus with hyperglycemia: Secondary | ICD-10-CM | POA: Diagnosis present

## 2017-02-06 DIAGNOSIS — K8689 Other specified diseases of pancreas: Secondary | ICD-10-CM | POA: Diagnosis present

## 2017-02-06 DIAGNOSIS — E876 Hypokalemia: Secondary | ICD-10-CM | POA: Diagnosis present

## 2017-02-06 HISTORY — DX: Type 2 diabetes mellitus without complications: E11.9

## 2017-02-06 HISTORY — DX: Tobacco use: Z72.0

## 2017-02-06 HISTORY — DX: Nonspecific elevation of levels of transaminase and lactic acid dehydrogenase (ldh): R74.0

## 2017-02-06 HISTORY — DX: Alcohol abuse, uncomplicated: F10.10

## 2017-02-06 HISTORY — DX: Alcoholic hepatitis without ascites: K70.10

## 2017-02-06 HISTORY — DX: Alcohol dependence with withdrawal delirium: F10.231

## 2017-02-06 LAB — BLOOD GAS, VENOUS
Acid-base deficit: 0.6 mmol/L (ref 0.0–2.0)
Bicarbonate: 24.5 mmol/L (ref 20.0–28.0)
FIO2: 0.21
O2 Content: 21 L/min
O2 Saturation: 98.2 %
pCO2, Ven: 32.2 mmHg — ABNORMAL LOW (ref 44.0–60.0)
pH, Ven: 7.463 — ABNORMAL HIGH (ref 7.250–7.430)
pO2, Ven: 150 mmHg — ABNORMAL HIGH (ref 32.0–45.0)

## 2017-02-06 LAB — GLUCOSE, CAPILLARY
Glucose-Capillary: 278 mg/dL — ABNORMAL HIGH (ref 65–99)
Glucose-Capillary: 210 mg/dL — ABNORMAL HIGH (ref 65–99)
Glucose-Capillary: 199 mg/dL — ABNORMAL HIGH (ref 65–99)

## 2017-02-06 LAB — CBG MONITORING, ED
GLUCOSE-CAPILLARY: 266 mg/dL — AB (ref 65–99)
GLUCOSE-CAPILLARY: 216 mg/dL — AB (ref 65–99)
Glucose-Capillary: 330 mg/dL — ABNORMAL HIGH (ref 65–99)

## 2017-02-06 LAB — CBC WITH DIFFERENTIAL/PLATELET
BASOS ABS: 0 10*3/uL (ref 0.0–0.1)
BASOS PCT: 0 %
EOS ABS: 0 10*3/uL (ref 0.0–0.7)
Eosinophils Relative: 0 %
HCT: 41.5 % (ref 39.0–52.0)
HEMOGLOBIN: 14.5 g/dL (ref 13.0–17.0)
LYMPHS ABS: 1.4 10*3/uL (ref 0.7–4.0)
Lymphocytes Relative: 18 %
MCH: 29.4 pg (ref 26.0–34.0)
MCHC: 34.9 g/dL (ref 30.0–36.0)
MCV: 84 fL (ref 78.0–100.0)
Monocytes Absolute: 0.5 10*3/uL (ref 0.1–1.0)
Monocytes Relative: 6 %
NEUTROS PCT: 76 %
Neutro Abs: 6.2 10*3/uL (ref 1.7–7.7)
Platelets: 171 10*3/uL (ref 150–400)
RBC: 4.94 MIL/uL (ref 4.22–5.81)
RDW: 12.4 % (ref 11.5–15.5)
WBC: 8.2 10*3/uL (ref 4.0–10.5)

## 2017-02-06 LAB — ETHANOL: Alcohol, Ethyl (B): 222 mg/dL — ABNORMAL HIGH (ref <10)

## 2017-02-06 LAB — COMPREHENSIVE METABOLIC PANEL
ALBUMIN: 5 g/dL (ref 3.5–5.0)
ALK PHOS: 957 U/L — AB (ref 38–126)
ALT: 216 U/L — AB (ref 17–63)
AST: 737 U/L — ABNORMAL HIGH (ref 15–41)
Anion gap: 31 — ABNORMAL HIGH (ref 5–15)
BUN: 12 mg/dL (ref 6–20)
CALCIUM: 9.4 mg/dL (ref 8.9–10.3)
CHLORIDE: 80 mmol/L — AB (ref 101–111)
CO2: 22 mmol/L (ref 22–32)
CREATININE: 0.75 mg/dL (ref 0.61–1.24)
GFR calc Af Amer: >60 mL/min (ref >60)
GFR calc non Af Amer: >60 mL/min (ref >60)
GLUCOSE: 372 mg/dL — AB (ref 65–99)
Potassium: 3.5 mmol/L (ref 3.5–5.1)
SODIUM: 133 mmol/L — AB (ref 135–145)
Total Bilirubin: 4.5 mg/dL — ABNORMAL HIGH (ref 0.3–1.2)
Total Protein: 8.8 g/dL — ABNORMAL HIGH (ref 6.5–8.1)

## 2017-02-06 LAB — MRSA PCR SCREENING: MRSA by PCR: NEGATIVE

## 2017-02-06 LAB — TROPONIN I
Troponin I: 0.03 ng/mL (ref <0.03)
Troponin I: 0.03 ng/mL (ref <0.03)

## 2017-02-06 LAB — SALICYLATE LEVEL

## 2017-02-06 LAB — I-STAT CG4 LACTIC ACID, ED: Lactic Acid, Venous: 6.64 mmol/L (ref 0.5–1.9)

## 2017-02-06 LAB — LACTIC ACID, PLASMA
LACTIC ACID, VENOUS: 6.8 mmol/L — AB (ref 0.5–1.9)
Lactic Acid, Venous: 5.6 mmol/L (ref 0.5–1.9)

## 2017-02-06 LAB — PROTIME-INR
INR: 1.2
Prothrombin Time: 15.1 seconds (ref 11.4–15.2)

## 2017-02-06 LAB — INFLUENZA PANEL BY PCR (TYPE A & B)
Influenza A By PCR: NEGATIVE
Influenza B By PCR: NEGATIVE

## 2017-02-06 LAB — MAGNESIUM: Magnesium: 2.2 mg/dL (ref 1.7–2.4)

## 2017-02-06 LAB — LIPASE, BLOOD: Lipase: 95 U/L — ABNORMAL HIGH (ref 11–51)

## 2017-02-06 LAB — ACETAMINOPHEN LEVEL

## 2017-02-06 MED ORDER — KETOROLAC TROMETHAMINE 30 MG/ML IJ SOLN
30.0000 mg | Freq: Four times a day (QID) | INTRAMUSCULAR | Status: AC | PRN
  Administered 2017-02-07 – 2017-02-08 (×3): 30 mg via INTRAVENOUS
  Filled 2017-02-06 (×3): qty 1

## 2017-02-06 MED ORDER — SODIUM CHLORIDE 0.9 % IV BOLUS (SEPSIS): 1000.0000 mL | Freq: Once | INTRAVENOUS | Status: DC

## 2017-02-06 MED ORDER — SODIUM CHLORIDE 0.9 % IV BOLUS (SEPSIS)
1000.0000 mL | Freq: Once | INTRAVENOUS | Status: AC
  Administered 2017-02-06: 1000 mL via INTRAVENOUS

## 2017-02-06 MED ORDER — LORAZEPAM 2 MG/ML IJ SOLN: 0.0000 mg | Freq: Two times a day (BID) | INTRAMUSCULAR | Status: DC

## 2017-02-06 MED ORDER — KETOROLAC TROMETHAMINE 30 MG/ML IJ SOLN
30.0000 mg | Freq: Once | INTRAMUSCULAR | Status: AC
  Administered 2017-02-06: 30 mg via INTRAVENOUS
  Filled 2017-02-06: qty 1

## 2017-02-06 MED ORDER — LORAZEPAM 1 MG PO TABS: 1.0000 mg | ORAL_TABLET | Freq: Four times a day (QID) | ORAL | Status: AC | PRN

## 2017-02-06 MED ORDER — METOCLOPRAMIDE HCL 5 MG/ML IJ SOLN
10.0000 mg | Freq: Once | INTRAMUSCULAR | Status: AC
  Administered 2017-02-06: 10 mg via INTRAVENOUS
  Filled 2017-02-06: qty 2

## 2017-02-06 MED ORDER — LORAZEPAM 2 MG/ML IJ SOLN: 1.0000 mg | Freq: Four times a day (QID) | INTRAMUSCULAR | Status: AC | PRN

## 2017-02-06 MED ORDER — ONDANSETRON HCL 4 MG/2ML IJ SOLN: 4.0000 mg | Freq: Once | INTRAMUSCULAR | Status: DC

## 2017-02-06 MED ORDER — LORAZEPAM 2 MG/ML IJ SOLN
1.0000 mg | Freq: Once | INTRAMUSCULAR | Status: AC
  Administered 2017-02-06: 1 mg via INTRAVENOUS
  Filled 2017-02-06: qty 1

## 2017-02-06 MED ORDER — PIPERACILLIN-TAZOBACTAM 3.375 G IVPB 30 MIN
3.3750 g | Freq: Once | INTRAVENOUS | Status: AC
Start: 2017-02-06 — End: 2017-02-06
  Administered 2017-02-06: 3.375 g via INTRAVENOUS
  Filled 2017-02-06: qty 50

## 2017-02-06 MED ORDER — LORAZEPAM 2 MG/ML IJ SOLN
0.0000 mg | Freq: Four times a day (QID) | INTRAMUSCULAR | Status: DC
  Administered 2017-02-06: 2 mg via INTRAVENOUS
  Filled 2017-02-06: qty 1

## 2017-02-06 MED ORDER — ADULT MULTIVITAMIN W/MINERALS CH
1.0000 | ORAL_TABLET | Freq: Every day | ORAL | Status: DC
  Administered 2017-02-06 – 2017-02-10 (×5): 1 via ORAL
  Filled 2017-02-06 (×5): qty 1

## 2017-02-06 MED ORDER — VITAMIN B-1 100 MG PO TABS
100.0000 mg | ORAL_TABLET | Freq: Every day | ORAL | Status: DC
  Administered 2017-02-07 – 2017-02-10 (×4): 100 mg via ORAL
  Filled 2017-02-06 (×4): qty 1

## 2017-02-06 MED ORDER — INSULIN ASPART 100 UNIT/ML ~~LOC~~ SOLN
0.0000 [IU] | SUBCUTANEOUS | Status: DC
  Administered 2017-02-06: 8 [IU] via SUBCUTANEOUS
  Administered 2017-02-06 – 2017-02-07 (×2): 5 [IU] via SUBCUTANEOUS
  Administered 2017-02-07: 8 [IU] via SUBCUTANEOUS
  Administered 2017-02-07: 3 [IU] via SUBCUTANEOUS
  Administered 2017-02-07: 8 [IU] via SUBCUTANEOUS
  Administered 2017-02-07: 3 [IU] via SUBCUTANEOUS
  Administered 2017-02-08: 2 [IU] via SUBCUTANEOUS

## 2017-02-06 MED ORDER — PIPERACILLIN-TAZOBACTAM 3.375 G IVPB
3.3750 g | Freq: Three times a day (TID) | INTRAVENOUS | Status: DC
  Administered 2017-02-06 – 2017-02-07 (×2): 3.375 g via INTRAVENOUS
  Filled 2017-02-06 (×3): qty 50

## 2017-02-06 MED ORDER — NICOTINE 21 MG/24HR TD PT24
21.0000 mg | MEDICATED_PATCH | Freq: Every day | TRANSDERMAL | Status: DC
  Administered 2017-02-06 – 2017-02-10 (×5): 21 mg via TRANSDERMAL
  Filled 2017-02-06 (×5): qty 1

## 2017-02-06 MED ORDER — ONDANSETRON HCL 4 MG/2ML IJ SOLN
4.0000 mg | Freq: Four times a day (QID) | INTRAMUSCULAR | Status: DC | PRN
  Administered 2017-02-06 – 2017-02-08 (×2): 4 mg via INTRAVENOUS
  Filled 2017-02-06 (×2): qty 2

## 2017-02-06 MED ORDER — FOLIC ACID 1 MG PO TABS
1.0000 mg | ORAL_TABLET | Freq: Every day | ORAL | Status: DC
  Administered 2017-02-06 – 2017-02-10 (×5): 1 mg via ORAL
  Filled 2017-02-06 (×5): qty 1

## 2017-02-06 MED ORDER — SODIUM CHLORIDE 0.9 % IV SOLN
INTRAVENOUS | Status: DC
  Administered 2017-02-06: 2.7 [IU]/h via INTRAVENOUS
  Filled 2017-02-06: qty 1

## 2017-02-06 MED ORDER — PANTOPRAZOLE SODIUM 40 MG IV SOLR
40.0000 mg | INTRAVENOUS | Status: DC
  Administered 2017-02-06 – 2017-02-08 (×3): 40 mg via INTRAVENOUS
  Filled 2017-02-06 (×3): qty 40

## 2017-02-06 MED ORDER — SENNOSIDES-DOCUSATE SODIUM 8.6-50 MG PO TABS: 1.0000 | ORAL_TABLET | Freq: Every evening | ORAL | Status: DC | PRN

## 2017-02-06 MED ORDER — THIAMINE HCL 100 MG/ML IJ SOLN: 100.0000 mg | Freq: Every day | INTRAMUSCULAR | Status: DC

## 2017-02-06 MED ORDER — SODIUM CHLORIDE 0.9 % IV SOLN
INTRAVENOUS | Status: DC
  Administered 2017-02-06 – 2017-02-07 (×3): via INTRAVENOUS

## 2017-02-06 MED ORDER — ONDANSETRON HCL 4 MG PO TABS: 4.0000 mg | ORAL_TABLET | Freq: Four times a day (QID) | ORAL | Status: DC | PRN

## 2017-02-06 MED ORDER — DEXTROSE-NACL 5-0.45 % IV SOLN: INTRAVENOUS | Status: DC

## 2017-02-06 MED ORDER — LORAZEPAM 2 MG/ML IJ SOLN
0.0000 mg | INTRAMUSCULAR | Status: DC
  Administered 2017-02-06: 4 mg via INTRAVENOUS
  Administered 2017-02-06 – 2017-02-07 (×4): 2 mg via INTRAVENOUS
  Administered 2017-02-07: 4 mg via INTRAVENOUS
  Administered 2017-02-07 (×6): 2 mg via INTRAVENOUS
  Administered 2017-02-07: 4 mg via INTRAVENOUS
  Administered 2017-02-07 – 2017-02-08 (×9): 2 mg via INTRAVENOUS
  Administered 2017-02-09: 1 mg via INTRAVENOUS
  Administered 2017-02-09 (×3): 2 mg via INTRAVENOUS
  Administered 2017-02-09: 1 mg via INTRAVENOUS
  Administered 2017-02-09 – 2017-02-10 (×6): 2 mg via INTRAVENOUS
  Filled 2017-02-06 (×4): qty 1
  Filled 2017-02-06: qty 2
  Filled 2017-02-06 (×6): qty 1
  Filled 2017-02-06: qty 2
  Filled 2017-02-06 (×5): qty 1
  Filled 2017-02-06: qty 2
  Filled 2017-02-06: qty 1
  Filled 2017-02-06: qty 2
  Filled 2017-02-06 (×4): qty 1
  Filled 2017-02-06: qty 2
  Filled 2017-02-06 (×7): qty 1

## 2017-02-06 MED ORDER — THIAMINE HCL 100 MG/ML IJ SOLN
100.0000 mg | Freq: Every day | INTRAMUSCULAR | Status: DC
  Administered 2017-02-06: 100 mg via INTRAVENOUS
  Filled 2017-02-06: qty 2

## 2017-02-06 NOTE — Consult Note (Signed)
Consultation  Referring Provider:    [facility] Dr. Wynetta Emery  Primary Care Physician:  Patient, No Pcp Per Primary Gastroenterologist:        ??? Has seen Dr. Laural Golden in past at [facility] Reason for Consultation:     Dilated bile duct     Impression / Plan:   Mildly dilated common bile duct on [place] (10 mm) Alcoholic - w/ hx chronic  pancreatitis (Calcifications on CT chest 07/2016 and abnl CT abd, MR also)and lipase 95 now, starting to withdraw Abnl LFT's - I suspect combo of alcoholic liver disease/hepatitis  and biliary obstruction, perhaps also other infection, dehydration - could have some rhabdo Lactic acidosis EtOH level 222 said  last drink 2 d ago Diabetes w/ blood sugar > 200 - ? From chronic pancreatitis   I favor biliary dilation is less likely a stone and more likely from pancreatic inflammation/edema causing ductal compression and a distal CBD stricture in setting of chronic pancreatitis. He is to ill to do any testing for this right now and I would do an MRCP when he is stabvle enough to hold breath and lie flat for a long period of time.  We will f/u tomorrow.  Gatha Mayer, MD, [facility] [phone] (pager) 02/06/2017 4:47 PM           HPI:   Shawn Watkins is a 36 y.o. male w/ hx alcoholism, chronic pancreatitis, DT's and now is transferred from [facility] w/ several day hx of nausea and vomiting with chest pain and abdominal pain he says is diffuse. ED evaluation showed lactic acidosis, [place] w/ findings as listed below  1. Possible choledocholithiasis. Dilation of the common bile duct up to 10 mm diameter, most likely indicating obstruction. 2. Diffuse hepatic steatosis and/or hepatocellular disease without focal hepatic parenchymal abnormality. 3. Mildly distended gallbladder without visible gallstones.   He has been sig ill x 2-3 days or more and says only thing he has been able to keep down is some alcohol but said last drink was some beer 2 d  ago. Not c/o abdominal pain right now. Says neck is sore and troat burning. He was screened for influenza in the ED and it was negative - apparently some coughing also. CXR ok   Admitted 07/2016 [facility] with similar problems though no CBD dilation. Summary below   HOSPITAL COURSE:  Patient was admitted into the hospital for severe pancreatitis and hepatitis due to alcohol.  His discriminant factor was 46, and he was started on steroids.  His total bili stayed up over 20 for several days, then came down, as all his LFTs.  His alk phosphtase was elevated as well.  GI was consulted, and Dr Dereck Leep saw him and guided therapy.  He was placed on CIWA protocol, and did well.  He no longer had signs of withdrawal.  His Lipase came down to normal level as well, and he was able to tolerate food.  His RUQ US showed no obtructive disease, as well as his MRCP.  It was recommended that MRI be repeated with and without contrast in 4-6 weeks, and we will defer that to GI.  He must stop drinking indefinitely, and he was impressed on its importance. He will follow up with Dr Dereck Leep in a couple of weeks, and I have referred him to Dr Lorriane Shire.  He will avoid tylenol, and if taking NSAIDS, only with food.  He was given Thiamine and folate in the hospital,  and will continue. Dr Dereck Leep recommneded decreasing Prednisone by 63m per week, starting at current dose of 359m, until he sees him. Thank you for allowing me to participate in his care.  Good Day.    Past Medical History:  Diagnosis Date  . Alcohol abuse   . Delirium tremens ([place]  . Depression   . ETOH abuse 07/17/2016  . Liver problem    "reversible if quits drining"  . Pancreatitis   . Tobacco abuse 07/17/2016  . Transaminitis 07/17/2016  . Type 2 diabetes mellitus ([facility]12/23/2018    History reviewed. No pertinent surgical history.  History reviewed. No pertinent family history.   Social History   Social History Narrative   Single, no children   Shares a  home with mom   Has been working as a baBuilding control surveyornd drinker - alcoholic   Denies drug use   02/06/2017     Prior to Admission medications   Medication Sig Start Date End Date Taking? Authorizing Provider  ibuprofen (ADVIL,MOTRIN) 200 MG tablet Take 400-600 mg by mouth every 6 (six) hours as needed for moderate pain.   Yes [provider]    Current Facility-Administered Medications  Medication Dose Route Frequency Provider Last Rate Last Dose  . 0.9 %  sodium chloride infusion   Intravenous Continuous Johnson, Clanford L, MD 100 mL/hr at 02/06/17 1623    . folic acid (FOLVITE) tablet 1 mg  1 mg Oral Daily Johnson, Clanford L, MD      . insulin aspart (novoLOG) injection 0-15 Units  0-15 Units Subcutaneous Q4H Johnson, Clanford L, MD      . LORazepam (ATIVAN) injection 0-4 mg  0-4 mg Intravenous Q2H Johnson, Clanford L, MD      . LORazepam (ATIVAN) injection 0-4 mg  0-4 mg Intravenous Q6H Johnson, Clanford L, MD   2 mg at 02/06/17 1503  . LORazepam (ATIVAN) tablet 1 mg  1 mg Oral Q6H PRN JoMurlean IbaMD       Or  . LORazepam (ATIVAN) injection 1 mg  1 mg Intravenous Q6H PRN Johnson, Clanford L, MD      . multivitamin with minerals tablet 1 tablet  1 tablet Oral Daily Johnson, Clanford L, MD      . nicotine (NICODERM CQ - dosed in mg/24 hours) patch 21 mg  21 mg Transdermal Daily Johnson, Clanford L, MD      . ondansetron (ZOFRAN) tablet 4 mg  4 mg Oral Q6H PRN Johnson, Clanford L, MD       Or  . ondansetron (ZOFRAN) injection 4 mg  4 mg Intravenous Q6H PRN Johnson, Clanford L, MD      . pantoprazole (PROTONIX) injection 40 mg  40 mg Intravenous Q24H Johnson, Clanford L, MD      . piperacillin-tazobactam (ZOSYN) IVPB 3.375 g  3.375 g Intravenous Q8H McFrancine GravenDO      . senna-docusate (Senokot-S) tablet 1 tablet  1 tablet Oral QHS PRN JoWynetta EmeryClanford L, MD      . [SDerrill MemoN 02/07/2017] thiamine (VITAMIN B-1) tablet 100 mg  100 mg Oral Daily Johnson,  Clanford L, MD       Or  . [SDerrill MemoN 02/07/2017] thiamine (B-1) injection 100 mg  100 mg Intravenous Daily Johnson, Clanford L, MD        Allergies as of 02/06/2017  . (No Known Allergies)     Review of Systems:    This is  positive for those things mentioned in the HPI, also positive for anxious, shaky, scared, weak and fatigued. Has not slept in 3-4 days. All other review of systems are negative.       Physical Exam:  Vital signs in last 24 hours: Temp:  [97.4 F (36.3 C)-99 F (37.2 C)] 99 F (37.2 C) (12/23 1601) Pulse Rate:  [86-112] 98 (12/23 1631) Resp:  [16-25] 25 (12/23 1430) BP: (127-142)/(75-95) 136/83 (12/23 1430) SpO2:  [92 %-100 %] 94 % (12/23 1430) Weight:  [185 lb (83.9 kg)] 185 lb (83.9 kg) (12/23 1355)    General:  Moderately ill, tremuous, sweaty Eyes:  icteric. ENT:   Mouth and posterior pharynx free of lesions. Teeth in poor repair Neck:   supple w/o thyromegaly or mass.  Lungs: Clear to auscultation bilaterally. Heart:  S1S2, no rubs, murmurs, gallops. Abdomen:  soft, mildly tender RUQ, no hepatosplenomegaly, hernia, or mass and BS+.  Lymph:  no cervical or supraclavicular adenopathy. Neuro:  Alert, tremulous, answers ?'s appropriately and appears oriented x 3   Data Reviewed:   LAB RESULTS: Recent Labs    02/06/17 0800  WBC 8.2  HGB 14.5  HCT 41.5  PLT 171   BMET Recent Labs    02/06/17 0800  NA 133*  K 3.5  CL 80*  CO2 22  GLUCOSE 372*  BUN 12  CREATININE 0.75  CALCIUM 9.4   LFT Recent Labs    02/06/17 0800  PROT 8.8*  ALBUMIN 5.0  AST 737*  ALT 216*  ALKPHOS 957*  BILITOT 4.5*   PT/INR Recent Labs    02/06/17 0800  LABPROT 15.1  INR 1.20    STUDIES: Dg Chest 2 View  Result Date: 02/06/2017 CLINICAL DATA:  36 year old male with fever, cough, chest pain, vomiting for several days EXAM: CHEST  2 VIEW COMPARISON:  Recent prior chest x-ray and CT scan of the chest 07/27/2016 FINDINGS: The lungs are clear and  negative for focal airspace consolidation, pulmonary edema or suspicious pulmonary nodule. No pleural effusion or pneumothorax. Cardiac and mediastinal contours are within normal limits. No acute fracture or lytic or blastic osseous lesions. The visualized upper abdominal bowel gas pattern is unremarkable. IMPRESSION: Negative chest x-ray Electronically Signed   By: Jacqulynn Cadet M.D.   On: 02/06/2017 09:25   US Abdomen Limited Ruq  Result Date: 02/06/2017 CLINICAL DATA:  Three-day history of fever, nausea and vomiting, and cough. Abnormal liver function tests. EXAM: ULTRASOUND ABDOMEN LIMITED RIGHT UPPER QUADRANT COMPARISON:  MRI abdomen/ MRCP 07/20/2016. Right upper quadrant abdominal ultrasound 07/18/2016. CT abdomen and pelvis 07/17/2016. FINDINGS: Gallbladder: Mildly distended. No shadowing gallstones or echogenic sludge. No gallbladder wall thickening or pericholecystic fluid. Negative sonographic Murphy's sign according to the ultrasound technologist. Common bile duct: Diameter: Approximately 10 mm, significantly increased in caliber since the prior ultrasound where it measured 2 mm. Diffuse dilation of the common bile duct which can be followed to the pancreatic head. Multiple small shadowing calcifications are identified in the distal common duct. Liver: Diffusely increased and coarsened echotexture without focal hepatic parenchymal abnormality. Portal vein is patent on color Doppler imaging with normal direction of blood flow towards the liver. IMPRESSION: 1. Possible choledocholithiasis. Dilation of the common bile duct up to 10 mm diameter, most likely indicating obstruction. 2. Diffuse hepatic steatosis and/or hepatocellular disease without focal hepatic parenchymal abnormality. 3. Mildly distended gallbladder without visible gallstones. Electronically Signed   By: Evangeline Dakin M.D.   On: 02/06/2017 11:26  Thanks   LOS: 0 days   _0  E. Carlean Purl, MD, [facility] @  02/06/2017, 4:41  PM

## 2017-02-06 NOTE — ED Triage Notes (Signed)
Pt reports vomiting x 2 or 3 days.  Reports chest pain and left arm tingling since then as well.   Also reports diarrhea.

## 2017-02-06 NOTE — ED Provider Notes (Signed)
[facility] PROGRESSIVE CARE Provider Note   CSN: [id number] Arrival date & time: 02/06/17  0800     History   Chief Complaint Chief Complaint  Patient presents with  . Emesis    HPI Shawn Watkins is a 36 y.o. male with history of alcohol abuse, pancreatitis, depression who presents with a 3-day history of cough, nasal congestion, sneezing, nausea, vomiting, diarrhea, subjective fever, chills, myalgias.  Patient reports he has not been able to keep much food or fluids down.  He has had a burning sensation in his chest that is worse with vomiting.  He has not taken any medications at at home for his symptoms.  He denies any abdominal pain or bloody diarrhea.  Reports he feels very dehydrated.  Patient reports she has had a beer or 2 in the past 3 days, however he has not been able to keep anything down. He reports he has felt anxious as well.  HPI  Past Medical History:  Diagnosis Date  . Alcohol abuse   . Delirium tremens ([place])   . Depression   . ETOH abuse 07/17/2016  . Liver problem    "reversible if quits drining"  . Pancreatitis   . Tobacco abuse 07/17/2016  . Transaminitis 07/17/2016  . Type 2 diabetes mellitus ([place]) 02/06/2017    Patient Active Problem List   Diagnosis Date Noted  . Choledocholithiasis with obstruction 02/06/2017  . Severe sepsis ([place]) 02/06/2017  . Alcohol abuse 02/06/2017  . Severe dehydration 02/06/2017  . Sinus tachycardia 02/06/2017  . Fever and chills 02/06/2017  . Hyperglycemia 02/06/2017  . Type 2 diabetes mellitus ([place]) 02/06/2017  . Acute alcoholic hepatitis 16/96/7893  . Alcoholic pancreatitis 81/02/7508  . Acute gallstone pancreatitis 07/17/2016  . Transaminitis 07/17/2016  . Leukocytosis 07/17/2016  . Hyponatremia 07/17/2016  . ETOH abuse 07/17/2016  . Tobacco abuse 07/17/2016    History reviewed. No pertinent surgical history.     Home Medications    Prior to Admission medications   Medication Sig Start Date End  Date Taking? Authorizing Provider  ibuprofen (ADVIL,MOTRIN) 200 MG tablet Take 400-600 mg by mouth every 6 (six) hours as needed for moderate pain.   Yes [provider]    Family History History reviewed. No pertinent family history.  Social History Social History   Tobacco Use  . Smoking status: Current Every Day Smoker    Packs/day: 1.00    Types: Cigarettes    Start date: 08/04/2006  . Smokeless tobacco: Never Used  Substance Use Topics  . Alcohol use: Yes    Alcohol/week: 60.0 oz    Types: 100 Cans of beer per week    Comment: daily  . Drug use: No     Allergies   Patient has no known allergies.   Review of Systems Review of Systems  Constitutional: Positive for fatigue. Negative for chills and fever.  HENT: Positive for congestion, sneezing and sore throat. Negative for facial swelling.   Respiratory: Positive for cough. Negative for shortness of breath.   Cardiovascular: Positive for chest pain.  Gastrointestinal: Positive for diarrhea, nausea and vomiting. Negative for abdominal pain and blood in stool.  Genitourinary: Negative for dysuria.  Musculoskeletal: Positive for myalgias. Negative for back pain.  Skin: Negative for rash and wound.  Neurological: Negative for headaches.  Psychiatric/Behavioral: The patient is not nervous/anxious.      Physical Exam Updated Vital Signs BP 136/83   Pulse 98   Temp 99 F (37.2 C) (  Oral)   Resp (!) 25   Ht _0  (1.854 m)   Wt 83.9 kg (185 lb)   SpO2 94%   BMI 24.41 kg/m   Physical Exam  Constitutional: He appears well-developed and well-nourished. No distress.  Patient appears uncomfortable  HENT:  Head: Normocephalic and atraumatic.  Right Ear: Tympanic membrane normal.  Left Ear: Tympanic membrane normal.  Mouth/Throat: Posterior oropharyngeal erythema present. No oropharyngeal exudate or tonsillar abscesses.  Eyes: Conjunctivae are normal. Pupils are equal, round, and reactive to light. Right  eye exhibits no discharge. Left eye exhibits no discharge. No scleral icterus.  Neck: Normal range of motion. Neck supple. No thyromegaly present.  Cardiovascular: Normal rate, regular rhythm, normal heart sounds and intact distal pulses. Exam reveals no gallop and no friction rub.  No murmur heard. Pulmonary/Chest: Effort normal and breath sounds normal. No stridor. No respiratory distress. He has no wheezes. He has no rales.  Abdominal: Soft. Bowel sounds are normal. He exhibits no distension. There is no tenderness. There is no rebound and no guarding.  Musculoskeletal: He exhibits no edema.  Lymphadenopathy:    He has no cervical adenopathy.  Neurological: He is alert. Coordination normal.  Skin: Skin is warm. No rash noted. He is diaphoretic. No pallor.  Psychiatric: He has a normal mood and affect.  Nursing note and vitals reviewed.    ED Treatments / Results  Labs (all labs ordered are listed, but only abnormal results are displayed) Labs Reviewed  COMPREHENSIVE METABOLIC PANEL - Abnormal; Notable for the following components:      Result Value   Sodium 133 (*)    Chloride 80 (*)    Glucose, Bld 372 (*)    Total Protein 8.8 (*)    AST 737 (*)    ALT 216 (*)    Alkaline Phosphatase 957 (*)    Total Bilirubin 4.5 (*)    Anion gap 31 (*)    All other components within normal limits  LIPASE, BLOOD - Abnormal; Notable for the following components:   Lipase 95 (*)    All other components within normal limits  TROPONIN I - Abnormal; Notable for the following components:   Troponin I 0.03 (*)    All other components within normal limits  ETHANOL - Abnormal; Notable for the following components:   Alcohol, Ethyl (B) 222 (*)    All other components within normal limits  ACETAMINOPHEN LEVEL - Abnormal; Notable for the following components:   Acetaminophen (Tylenol), Serum <10 (*)    All other components within normal limits  LACTIC ACID, PLASMA - Abnormal; Notable for the  following components:   Lactic Acid, Venous 6.8 (*)    All other components within normal limits  LACTIC ACID, PLASMA - Abnormal; Notable for the following components:   Lactic Acid, Venous 5.6 (*)    All other components within normal limits  BLOOD GAS, VENOUS - Abnormal; Notable for the following components:   pH, Ven 7.463 (*)    pCO2, Ven 32.2 (*)    pO2, Ven 150.0 (*)    All other components within normal limits  TROPONIN I - Abnormal; Notable for the following components:   Troponin I 0.03 (*)    All other components within normal limits  GLUCOSE, CAPILLARY - Abnormal; Notable for the following components:   Glucose-Capillary 278 (*)    All other components within normal limits  CBG MONITORING, ED - Abnormal; Notable for the following components:   Glucose-Capillary 330 (*)  All other components within normal limits  I-STAT CG4 LACTIC ACID, ED - Abnormal; Notable for the following components:   Lactic Acid, Venous 6.64 (*)    All other components within normal limits  CBG MONITORING, ED - Abnormal; Notable for the following components:   Glucose-Capillary 266 (*)    All other components within normal limits  CBG MONITORING, ED - Abnormal; Notable for the following components:   Glucose-Capillary 216 (*)    All other components within normal limits  CULTURE, BLOOD (ROUTINE X 2)  CULTURE, BLOOD (ROUTINE X 2)  MRSA PCR SCREENING  CBC WITH DIFFERENTIAL/PLATELET  MAGNESIUM  SALICYLATE LEVEL  PROTIME-INR  INFLUENZA PANEL BY PCR (TYPE A & B)  URINALYSIS, ROUTINE W REFLEX MICROSCOPIC  RAPID URINE DRUG SCREEN, HOSP PERFORMED  HEMOGLOBIN A1C  COMPREHENSIVE METABOLIC PANEL  CBC WITH DIFFERENTIAL/PLATELET  PROTIME-INR  I-STAT CG4 LACTIC ACID, ED    EKG  EKG Interpretation  Date/Time:  Sunday February 06 2017 08:07:02 EST Ventricular Rate:  98 PR Interval:    QRS Duration: 89 QT Interval:  421 QTC Calculation: 538 R Axis:   75 Text Interpretation:  Sinus rhythm  Prolonged QT interval When compared with ECG of 07/27/2016 QT has lengthened Confirmed by Francine Graven [phone]) on 02/06/2017 9:11:13 AM       Radiology Dg Chest 2 View  Result Date: 02/06/2017 CLINICAL DATA:  36 year old male with fever, cough, chest pain, vomiting for several days EXAM: CHEST  2 VIEW COMPARISON:  Recent prior chest x-ray and CT scan of the chest 07/27/2016 FINDINGS: The lungs are clear and negative for focal airspace consolidation, pulmonary edema or suspicious pulmonary nodule. No pleural effusion or pneumothorax. Cardiac and mediastinal contours are within normal limits. No acute fracture or lytic or blastic osseous lesions. The visualized upper abdominal bowel gas pattern is unremarkable. IMPRESSION: Negative chest x-ray Electronically Signed   By: Jacqulynn Cadet M.D.   On: 02/06/2017 09:25   US Abdomen Limited Ruq  Result Date: 02/06/2017 CLINICAL DATA:  Three-day history of fever, nausea and vomiting, and cough. Abnormal liver function tests. EXAM: ULTRASOUND ABDOMEN LIMITED RIGHT UPPER QUADRANT COMPARISON:  MRI abdomen/ MRCP 07/20/2016. Right upper quadrant abdominal ultrasound 07/18/2016. CT abdomen and pelvis 07/17/2016. FINDINGS: Gallbladder: Mildly distended. No shadowing gallstones or echogenic sludge. No gallbladder wall thickening or pericholecystic fluid. Negative sonographic Murphy's sign according to the ultrasound technologist. Common bile duct: Diameter: Approximately 10 mm, significantly increased in caliber since the prior ultrasound where it measured 2 mm. Diffuse dilation of the common bile duct which can be followed to the pancreatic head. Multiple small shadowing calcifications are identified in the distal common duct. Liver: Diffusely increased and coarsened echotexture without focal hepatic parenchymal abnormality. Portal vein is patent on color Doppler imaging with normal direction of blood flow towards the liver. IMPRESSION: 1. Possible  choledocholithiasis. Dilation of the common bile duct up to 10 mm diameter, most likely indicating obstruction. 2. Diffuse hepatic steatosis and/or hepatocellular disease without focal hepatic parenchymal abnormality. 3. Mildly distended gallbladder without visible gallstones. Electronically Signed   By: Evangeline Dakin M.D.   On: 02/06/2017 11:26    Procedures Procedures (including critical care time)  CRITICAL CARE Performed by: Frederica Kuster   Total critical care time: 30 minutes  Critical care time was exclusive of separately billable procedures and treating other patients.  Critical care was necessary to treat or prevent imminent or life-threatening deterioration.  Critical care was time spent personally by me on the following  activities: development of treatment plan with patient and/or surrogate as well as nursing, discussions with consultants, evaluation of patient's response to treatment, examination of patient, obtaining history from patient or surrogate, ordering and performing treatments and interventions, ordering and review of laboratory studies, ordering and review of radiographic studies, pulse oximetry and re-evaluation of patient's condition.  Sepsis - Repeat Assessment  Performed at:    1230  Vitals     Blood pressure 135/88, pulse 108, temperature 99 F (37.2 C), temperature source Oral, resp. rate 16, height _0  (1.854 m), weight 83.9 kg (185 lb), SpO2 96 %.  Heart:     Tachycardic  Lungs:    CTA  Capillary Refill:   <2 sec  Peripheral Pulse:   Radial pulse palpable  Skin:     Normal Color     Medications Ordered in ED Medications  piperacillin-tazobactam (ZOSYN) IVPB 3.375 g (not administered)  0.9 %  sodium chloride infusion ( Intravenous New Bag/Given 02/06/17 1623)  LORazepam (ATIVAN) tablet 1 mg (not administered)    Or  LORazepam (ATIVAN) injection 1 mg (not administered)  thiamine (VITAMIN B-1) tablet 100 mg (not administered)    Or    thiamine (B-1) injection 100 mg (not administered)  folic acid (FOLVITE) tablet 1 mg (1 mg Oral Given 02/06/17 1645)  multivitamin with minerals tablet 1 tablet (1 tablet Oral Given 02/06/17 1645)  senna-docusate (Senokot-S) tablet 1 tablet (not administered)  ondansetron (ZOFRAN) tablet 4 mg ( Oral See Alternative 02/06/17 1646)    Or  ondansetron (ZOFRAN) injection 4 mg (4 mg Intravenous Given 02/06/17 1646)  insulin aspart (novoLOG) injection 0-15 Units (not administered)  pantoprazole (PROTONIX) injection 40 mg (not administered)  nicotine (NICODERM CQ - dosed in mg/24 hours) patch 21 mg (21 mg Transdermal Patch Applied 02/06/17 1647)  LORazepam (ATIVAN) injection 0-4 mg (2 mg Intravenous Given 02/06/17 1646)  LORazepam (ATIVAN) injection 0-4 mg (2 mg Intravenous Given 02/06/17 1503)  sodium chloride 0.9 % bolus 1,000 mL (0 mLs Intravenous Stopped 02/06/17 1029)  ketorolac (TORADOL) 30 MG/ML injection 30 mg (30 mg Intravenous Given 02/06/17 0906)  LORazepam (ATIVAN) injection 1 mg (1 mg Intravenous Given 02/06/17 0906)  metoCLOPramide (REGLAN) injection 10 mg (10 mg Intravenous Given 02/06/17 0906)  sodium chloride 0.9 % bolus 1,000 mL (0 mLs Intravenous Stopped 02/06/17 1204)  sodium chloride 0.9 % bolus 1,000 mL (0 mLs Intravenous Stopped 02/06/17 1241)  piperacillin-tazobactam (ZOSYN) IVPB 3.375 g (0 g Intravenous Stopped 02/06/17 1240)  metoCLOPramide (REGLAN) injection 10 mg (10 mg Intravenous Given 02/06/17 1256)     Initial Impression / Assessment and Plan / ED Course  I have reviewed the triage vital signs and the nursing notes.  Pertinent labs & imaging results that were available during my care of the patient were reviewed by me and considered in my medical decision making (see chart for details).  Clinical Course as of Feb 07 1708  Sun Feb 06, 2017  1106 Made aware that patient's lactic acid is 6.8. Will initiate weight based fluids, however will hold off on calling  code sepsis and initiating antibiotics, due to lactic likely related to liver disease, based on LFTs. RUQ is pending.  [AL]  0626 Patient starting to feel very shaky and reports he feels like he may be starting withdrawls. He denies hallucinations. He does appear to be very shaky in his hands. Will initiate CIWA protocol. I will also reorder Reglan as patient had another episode of vomiting while I was in  the room.  [AL]  1333 I spoke with Dr. Leroy Kennedy with GI at [facility] who will see the patient on arrival to [facility].  [AL]    Clinical Course User Index [AL] Frederica Kuster, PA-C    Patient with choledocholithiasis seen on RUQ ultrasound.  Code sepsis called considering patient's elevated lactic acid level, suspicion for infection, and tachycardia.  Weightbase fluids, Zosyn initiated. CBC unremarkable. CMP shows sodium 133, chloride 80, glucose 372, AST 737, ALT 216, alk phos 957, total bilirubin 4.5, anion gap 31.  Patient has no history of diabetes.  Suspect anion gap related to alcohol use.  PH 7.46, bicarb 32.2.  Troponin 0 0.03.  I feel this is due to demand, however will trend troponins. Patient was given insulin drip on glucose stabilizer for concern for DKA.  Patient also developing signs of alcohol withdrawal.  He denies hallucinations at this time.  CIWA protocol initiated.  I consulted gastroenterologist, Dr. Gala Romney, who advised patient be admitted at [facility] for further management.  I consulted [facility] at any pen and spoke with Dr. Wynetta Emery who will admit the patient to [facility].  He requests consultation with [facility] gastroenterology.  I spoke with Dr. Leroy Kennedy, gastroenterologist, who will evaluate the patient on arrival.  I discussed patient case with Dr. Thurnell Garbe who guided the patient's management and agrees with plan.  I discussed the plan and diagnosis with the patient who agrees for transfer to [facility].  He understands and agrees with  plan.  Final Clinical Impressions(s) / ED Diagnoses   Final diagnoses:  LFTs abnormal  Sepsis, due to unspecified organism [facility])  Choledocholithiasis    ED Discharge Orders    None       Frederica Kuster, PA-C 02/06/17 [place], [place], DO 02/08/17 0840

## 2017-02-06 NOTE — ED Notes (Signed)
Lactic acid reported to pa

## 2017-02-06 NOTE — ED Notes (Signed)
Pt aware of need for urine sample but states he does not have to urinate at this time. Pt given urinal and informed to let nursing staff know when able to provide sample

## 2017-02-06 NOTE — Progress Notes (Signed)
Pharmacy Antibiotic Note  Shawn Watkins is a 36 y.o. male admitted on 02/06/2017 with intra abdominal infection.  Pharmacy has been consulted for zosyn dosing.  Plan: Zosyn 3.375g IV q8h (4 hour infusion).  Height: 6\' 1"  (185.4 cm) Weight: 185 lb (83.9 kg) IBW/kg (Calculated) : 79.9  Temp (24hrs), Avg:97.4 F (36.3 C), Min:97.4 F (36.3 C), Max:97.4 F (36.3 C)  Recent Labs  Lab 02/06/17 0800 02/06/17 1000 02/06/17 1121  WBC 8.2  --   --   CREATININE 0.75  --   --   LATICACIDVEN  --  6.8* 6.64*    Estimated Creatinine Clearance: 145.7 mL/min (by C-G formula based on SCr of 0.75 mg/dL).    No Known Allergies  Antimicrobials this admission: zosyn 12/23 >>     Thank you for allowing pharmacy to be a part of this patient's care.  Talbert CageSeay, Royalty Domagala Poteet 02/06/2017 11:46 AM

## 2017-02-06 NOTE — H&P (Addendum)
History and Physical  Shawn Watkins ZOX:[id number] DOB: 01/17/1981 DOA: 02/06/2017   Referring physician: Clarene Watkins PCP: Patient, No Pcp Per   Chief Complaint: chest pain and vomiting  HPI: Shawn Shawn Watkins is a 36 y.o. male known alcohol abuser presents to ED complaining of 3 days of chest pain, vomiting, diarrhea, abdominal pain and alcohol withdrawal.  His last drink was 2 days ago and he has a history of severe delirium tremens.  He also has a history of pancreatitis, depression who presents with a 3-day history of cough, nasal congestion, sneezing, nausea, vomiting, diarrhea, subjective fever, chills, myalgias.  Patient reports he has not been able to keep much food or fluids down.  He has had a burning sensation in his chest that is worse with vomiting.  He has not taken any medications at at home for his symptoms.  He denies any abdominal pain or bloody diarrhea.  Reports he feels very dehydrated.  Patient reports she has had a beer or 2 in the past 3 days, however he has not been able to keep anything down. He reports he has felt anxious as well.  ED Course: Pt reported diffuse abdominal pain.  He was noted to have an elevated lactic acid >6, severely dehydrated, tachycardic, hyperglycemic.  He had a CT abdomen that suggests choledocholithiasis with possible obstruction.  GI was consulted at [facility] and recommended that he be transferred to [facility].  Pt was started on IV zosyn, Sepsis protocol was started and was briefly placed on insulin drip for hyperglycemia but he was never in DKA.    Review of Systems:  Constitutional: Positive for fatigue. Negative for chills and fever.  HENT: Positive for congestion, sneezing and sore throat. Negative for facial swelling.   Respiratory: Positive for cough. Negative for shortness of breath.   Cardiovascular: Positive for chest pain.  Gastrointestinal: Positive for diarrhea, nausea and vomiting. Negative for abdominal pain and blood in stool.    Genitourinary: Negative for dysuria.  Musculoskeletal: Positive for myalgias. Negative for back pain.  Skin: Negative for rash and wound.  Neurological: Negative for headaches.  Psychiatric/Behavioral: The patient is not nervous/anxious.   All systems reviewed and apart from history of presenting illness, are negative.  Past Medical History:  Diagnosis Date  . Alcohol abuse   . Delirium tremens (HCC)   . Depression   . ETOH abuse 07/17/2016  . Liver problem    "reversible if quits drining"  . Pancreatitis   . Tobacco abuse 07/17/2016  . Transaminitis 07/17/2016  . Type 2 diabetes mellitus (HCC) 02/06/2017   History reviewed. No pertinent surgical history. Social History:  reports that he has been smoking cigarettes.  He started smoking about 10 years ago. He has been smoking about 1.00 pack per day. he has never used smokeless tobacco. He reports that he drinks about 60.0 oz of alcohol per week. He reports that he does not use drugs.  No Known Allergies  History reviewed. No pertinent family history.  Prior to Admission medications   Medication Sig Start Date End Date Taking? Authorizing Provider  ibuprofen (ADVIL,MOTRIN) 200 MG tablet Take 400-600 mg by mouth every 6 (six) hours as needed for moderate pain.   Yes [provider]   Physical Exam: Vitals:   02/06/17 1100 02/06/17 1103 02/06/17 1145 02/06/17 1230  BP: 136/84 137/83  135/88  Pulse: (!) 112 86 (!) 104 (!) 102  Resp: 20 16    Temp:  TempSrc:      SpO2: 96% 100% 96% 96%  Weight:      Height:        General exam: Moderately built and nourished patient, lying comfortably supine on the gurney in no obvious distress.  He appears anxious.   Head, eyes and ENT: Nontraumatic and normocephalic. Pupils equally reacting to light and accommodation. Oral mucosa dry.  Neck: Supple. No JVD, carotid bruit or thyromegaly.  Lymphatics: No lymphadenopathy.  Respiratory system: Clear to auscultation. No increased  work of breathing.  Cardiovascular system: S1 and S2 heard, tachycardic. No JVD, murmurs, gallops, clicks or pedal edema.  Gastrointestinal system: Abdomen is mildly distended, soft and diffusely tender. Normal bowel sounds heard. No organomegaly or masses appreciated.  Central nervous system: Alert and oriented. No focal neurological deficits.  Extremities: Symmetric 5 x 5 power. Peripheral pulses symmetrically felt.   Skin: No rashes or acute findings.  Musculoskeletal system: Negative exam.  Psychiatry: Pleasant and cooperative.  Labs on Admission:  Basic Metabolic Panel: Recent Labs  Lab 02/06/17 0800  NA 133*  K 3.5  CL 80*  CO2 22  GLUCOSE 372*  BUN 12  CREATININE 0.75  CALCIUM 9.4  MG 2.2   Liver Function Tests: Recent Labs  Lab 02/06/17 0800  AST 737*  ALT 216*  ALKPHOS 957*  BILITOT 4.5*  PROT 8.8*  ALBUMIN 5.0   Recent Labs  Lab 02/06/17 0800  LIPASE 95*   No results for input(s): AMMONIA in the last 168 hours. CBC: Recent Labs  Lab 02/06/17 0800  WBC 8.2  NEUTROABS 6.2  HGB 14.5  HCT 41.5  MCV 84.0  PLT 171   Cardiac Enzymes: Recent Labs  Lab 02/06/17 0800  TROPONINI 0.03*    BNP (last 3 results) No results for input(s): PROBNP in the last 8760 hours. CBG: Recent Labs  Lab 02/06/17 1033 02/06/17 1206 02/06/17 1304  GLUCAP 330* 266* 216*    Radiological Exams on Admission: Dg Chest 2 View  Result Date: 02/06/2017 CLINICAL DATA:  36 year old male with fever, cough, chest pain, vomiting for several days EXAM: CHEST  2 VIEW COMPARISON:  Recent prior chest x-ray and CT scan of the chest 07/27/2016 FINDINGS: The lungs are clear and negative for focal airspace consolidation, pulmonary edema or suspicious pulmonary nodule. No pleural effusion or pneumothorax. Cardiac and mediastinal contours are within normal limits. No acute fracture or lytic or blastic osseous lesions. The visualized upper abdominal bowel gas pattern is  unremarkable. IMPRESSION: Negative chest x-ray Electronically Signed   By: Malachy Moan M.D.   On: 02/06/2017 09:25   US Abdomen Limited Ruq  Result Date: 02/06/2017 CLINICAL DATA:  Three-day history of fever, nausea and vomiting, and cough. Abnormal liver function tests. EXAM: ULTRASOUND ABDOMEN LIMITED RIGHT UPPER QUADRANT COMPARISON:  MRI abdomen/ MRCP 07/20/2016. Right upper quadrant abdominal ultrasound 07/18/2016. CT abdomen and pelvis 07/17/2016. FINDINGS: Gallbladder: Mildly distended. No shadowing gallstones or echogenic sludge. No gallbladder wall thickening or pericholecystic fluid. Negative sonographic Murphy's sign according to the ultrasound technologist. Common bile duct: Diameter: Approximately 10 mm, significantly increased in caliber since the prior ultrasound where it measured 2 mm. Diffuse dilation of the common bile duct which can be followed to the pancreatic head. Multiple small shadowing calcifications are identified in the distal common duct. Liver: Diffusely increased and coarsened echotexture without focal hepatic parenchymal abnormality. Portal vein is patent on color Doppler imaging with normal direction of blood flow towards the liver. IMPRESSION: 1. Possible choledocholithiasis. Dilation  of the common bile duct up to 10 mm diameter, most likely indicating obstruction. 2. Diffuse hepatic steatosis and/or hepatocellular disease without focal hepatic parenchymal abnormality. 3. Mildly distended gallbladder without visible gallstones. Electronically Signed   By: Hulan Saashomas  Lawrence M.D.   On: 02/06/2017 11:26   EKG: Independently reviewed. NSR  Assessment/Plan Principal Problem:   Choledocholithiasis with obstruction Active Problems:   Transaminitis   ETOH abuse   Tobacco abuse   Acute alcoholic hepatitis   Severe sepsis (HCC)   Alcohol abuse   Severe dehydration   Sinus tachycardia   Fever and chills   Hyperglycemia   Type 2 diabetes mellitus  (HCC)  1. Choledocholithiasis with obstruction - spoke with GI at [facility] and they recommended transfer to [facility] as they cannot do here. Keep NPO for now.  Follow blood cultures.  Continue IV Zosyn per pharmacy.  Continue supportive care.  Lactic acid is trending down.  ED consulting GI at [facility].  2. Severe sepsis secondary to above - Continue supportive care. Lactate trending down with IVFs, follow blood cultures.  3. Chest pain - suspect referred pain from choledocholithiasis/obstruction.  EKG no acute findings and troponin 0.03.  Continue to follow on telemetry.  4. Sinus Tachycardia - secondary to sepsis, continue supportive care and antibiotics.  5. ETOH Abuse - Pt is starting to withdraw, continue CIWA protocol.  Monitor in SDU given his history of severe DTs.  6. Tobacco Abuse - nicotine patch.  7. Severe Dehydration - IVFs.  8. Type 2 DM with hyperglycemia - While NPO, continue SSI q4h.  CBC q4h.   9. Transaminitis - continue to monitor daily, with PT/INR.  GI consult.   DVT Prophylaxis: SCDs Code Status: FULL   Family Communication: Mother   Disposition Plan: TBD   I called and signed out care to Dr. Butler Denmarkizwan who will assume care when patient [facility].   Time spent: 70 mins  Standley Dakinslanford Johnson, MD [facility] Pager [phone]  If 7PM-7AM, please contact night-coverage [email or web address] Password [email or web address] 02/06/2017, 1:11 PM

## 2017-02-06 NOTE — ED Notes (Signed)
CRITICAL VALUE ALERT  Critical Value:  Troponin 0.03  Date & Time Notied:  02/06/17  Provider Notified: Melodye PedA. Law, PA  Orders Received/Actions taken: see new orders

## 2017-02-06 NOTE — ED Notes (Signed)
Report given to Dorene SorrowJerry, paramedic with [facility].

## 2017-02-07 DIAGNOSIS — K760 Fatty (change of) liver, not elsewhere classified: Secondary | ICD-10-CM

## 2017-02-07 DIAGNOSIS — Z720 Tobacco use: Secondary | ICD-10-CM

## 2017-02-07 DIAGNOSIS — R1013 Epigastric pain: Secondary | ICD-10-CM

## 2017-02-07 DIAGNOSIS — E118 Type 2 diabetes mellitus with unspecified complications: Secondary | ICD-10-CM

## 2017-02-07 DIAGNOSIS — K861 Other chronic pancreatitis: Secondary | ICD-10-CM

## 2017-02-07 DIAGNOSIS — G8929 Other chronic pain: Secondary | ICD-10-CM

## 2017-02-07 DIAGNOSIS — F1010 Alcohol abuse, uncomplicated: Secondary | ICD-10-CM

## 2017-02-07 LAB — CBC WITH DIFFERENTIAL/PLATELET
Basophils Absolute: 0 10*3/uL (ref 0.0–0.1)
Basophils Relative: 0 %
EOS ABS: 0 10*3/uL (ref 0.0–0.7)
Eosinophils Relative: 1 %
HEMATOCRIT: 32.5 % — AB (ref 39.0–52.0)
HEMOGLOBIN: 11.5 g/dL — AB (ref 13.0–17.0)
LYMPHS ABS: 1.2 10*3/uL (ref 0.7–4.0)
LYMPHS PCT: 19 %
MCH: 30 pg (ref 26.0–34.0)
MCHC: 35.4 g/dL (ref 30.0–36.0)
MCV: 84.9 fL (ref 78.0–100.0)
MONOS PCT: 5 %
Monocytes Absolute: 0.3 10*3/uL (ref 0.1–1.0)
NEUTROS PCT: 75 %
Neutro Abs: 4.9 10*3/uL (ref 1.7–7.7)
Platelets: 117 10*3/uL — ABNORMAL LOW (ref 150–400)
RBC: 3.83 MIL/uL — AB (ref 4.22–5.81)
RDW: 13.1 % (ref 11.5–15.5)
WBC: 6.4 10*3/uL (ref 4.0–10.5)

## 2017-02-07 LAB — COMPREHENSIVE METABOLIC PANEL
ALT: 144 U/L — AB (ref 17–63)
AST: 234 U/L — AB (ref 15–41)
Albumin: 3.8 g/dL (ref 3.5–5.0)
Alkaline Phosphatase: 695 U/L — ABNORMAL HIGH (ref 38–126)
Anion gap: 13 (ref 5–15)
BUN: 14 mg/dL (ref 6–20)
CHLORIDE: 95 mmol/L — AB (ref 101–111)
CO2: 26 mmol/L (ref 22–32)
CREATININE: 0.86 mg/dL (ref 0.61–1.24)
Calcium: 8.6 mg/dL — ABNORMAL LOW (ref 8.9–10.3)
Glucose, Bld: 285 mg/dL — ABNORMAL HIGH (ref 65–99)
POTASSIUM: 3.6 mmol/L (ref 3.5–5.1)
SODIUM: 134 mmol/L — AB (ref 135–145)
Total Bilirubin: 2.7 mg/dL — ABNORMAL HIGH (ref 0.3–1.2)
Total Protein: 6.4 g/dL — ABNORMAL LOW (ref 6.5–8.1)

## 2017-02-07 LAB — PROTIME-INR
INR: 1.37
PROTHROMBIN TIME: 16.7 s — AB (ref 11.4–15.2)

## 2017-02-07 LAB — HEMOGLOBIN A1C
HEMOGLOBIN A1C: 9.8 % — AB (ref 4.8–5.6)
Mean Plasma Glucose: 234.56 mg/dL

## 2017-02-07 LAB — GLUCOSE, CAPILLARY
GLUCOSE-CAPILLARY: 273 mg/dL — AB (ref 65–99)
GLUCOSE-CAPILLARY: 67 mg/dL (ref 65–99)
Glucose-Capillary: 222 mg/dL — ABNORMAL HIGH (ref 65–99)
Glucose-Capillary: 266 mg/dL — ABNORMAL HIGH (ref 65–99)
Glucose-Capillary: 77 mg/dL (ref 65–99)
Glucose-Capillary: 178 mg/dL — ABNORMAL HIGH (ref 65–99)

## 2017-02-07 LAB — LACTIC ACID, PLASMA: Lactic Acid, Venous: 1 mmol/L (ref 0.5–1.9)

## 2017-02-07 MED ORDER — INSULIN GLARGINE 100 UNIT/ML ~~LOC~~ SOLN
15.0000 [IU] | Freq: Every day | SUBCUTANEOUS | Status: DC
  Administered 2017-02-07: 15 [IU] via SUBCUTANEOUS
  Filled 2017-02-07 (×2): qty 0.15

## 2017-02-07 MED ORDER — DEXTROSE 50 % IV SOLN
INTRAVENOUS | Status: AC
  Administered 2017-02-07: 25 mL
  Filled 2017-02-07: qty 50

## 2017-02-07 NOTE — Progress Notes (Signed)
[facility] Progress Note  Chief Complaint: Abnormal LFTs  Subjective  History:  He continues to have some intermittent nausea and epigastric pain. Mostly, he is concerned about his testing so far, since he seems confused about the plan.  There has been alcohol withdrawal thus far managed well with Ativan.  It should be noted that his alcohol level was 222 24 hours ago when the patient was initially seen at an outside hospital, even though he reported his last drink was at least 24 hours prior to that.  Alcohol level was 259 on 01/29/2017, and 304 on 12/01/2016.  ROS: Cardiovascular:  no chest pain Respiratory: no dyspnea  Objective:  Med list reviewed  Vital signs in last 24 hrs: Vitals:   02/07/17 0325 02/07/17 0802  BP: 122/84   Pulse: 91 (!) 106  Resp: 16   Temp: 99.1 F (37.3 C) 97.6 F (36.4 C)  SpO2: 91%     Physical Exam   HEENT: sclera anicteric, oral mucosa moist without lesions  Neck: supple, no thyromegaly, JVD or lymphadenopathy  Cardiac: RRR without murmurs, S1S2 heard, no peripheral edema  Pulm: clear to auscultation bilaterally, normal RR and effort noted  Abdomen: soft, no tenderness, with active bowel sounds. No guarding or palpable hepatosplenomegaly  Skin; warm and dry, no jaundice or rash Neuro: He is visibly anxious, he but he is alert conversational and oriented to this hospital and his length of stay.  He is quite tremulous, most notable with hands outstretched.  Recent Labs:  Recent Labs  Lab 02/06/17 0800 02/07/17 0247  WBC 8.2 6.4  HGB 14.5 11.5*  HCT 41.5 32.5*  PLT 171 117*   Recent Labs  Lab 02/07/17 0247  NA 134*  K 3.6  CL 95*  CO2 26  BUN 14  ALBUMIN 3.8  ALKPHOS 695*  ALT 144*  AST 234*  GLUCOSE 285*   Recent Labs  Lab 02/07/17 0247  INR 1.37    Radiologic studies:  None today Results from June 2018 CT scan and his most recent ultrasound were reviewed.  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  I do not think this patient has choledocholithiasis or cholangitis.  As such, I have discontinued his Zosyn. 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  His LFTs are improving today.  He definitely has diabetes from his chronic pancreatitis.  Plan: I discontinued his Zosyn  I started a clear liquid diet.  Certainly, if the patient decompensates into worsened alcohol withdrawal with severely affected mental status and is unable to protect his airway, and he should be made n.p.o.  I agree with Dr. Salem SenateGassner's initial assessment that an MRCP can be done in the future when the patient has recovered from this acute illness.  I agree he would not likely be able to lay still long enough to get a good quality study.  I am encouraged that his LFTs are improving.  Continue to monitor closely for alcohol withdrawal and treat as necessary.  Counseled re: need for absolute and indefinite alcohol abstinence.  Total time 35 minutes times, over half spent in chart review and counseling/coordinating care.  Shawn Watkins Pager [phone] Mon-Fri 8a-5p [phone] after 5p, weekends, holidays

## 2017-02-07 NOTE — Progress Notes (Signed)
Nutrition Brief Note  RD consulted for diet education for uncontrolled DM.   Lab Results  Component Value Date   HGBA1C 9.8 (H) 02/06/2017   Per PTA medications list, pt was not taking any home medications for DM.   Wt Readings from Last 15 Encounters:  02/06/17 185 lb (83.9 kg)  01/29/17 185 lb (83.9 kg)  10/11/16 185 lb (83.9 kg)  08/05/16 180 lb 11.2 oz (82 kg)  08/03/16 177 lb (80.3 kg)  07/27/16 185 lb (83.9 kg)  07/18/16 185 lb (83.9 kg)   Shawn Watkins is a 36 y.o. male known alcohol abuser presents to ED complaining of 3 days of chest pain, vomiting, diarrhea, abdominal pain and alcohol withdrawal.  His last drink was 2 days ago and he has a history of severe delirium tremens.   Pt admitted with choledocholithiasis with obstruction.   Case discussed with RN prior to visit; pt has been receiving ativan due to ETOH withdrawl.   Spoke with pt, who was cooperative, but very tangential at time of visit ("I'm really enjoying my sleep here"). Pt shares poor oral intake for 3 days PTA due to nausea and vomiting. Pt shares that he was following a very healthy diet up until about one month ago and was working out daily. However, pt shares that he relapsed with ETOH use, which lead to poor blood sugar control secondary to frequent ETOH use and erratic eating patterns. Pt shares "I know I need to do better" and shares with this RD times where he was successful with lifestyle modifications (ex "eating clean", consuming whole grains and low calorie beverages, and going to the gym daily).   Pt consuming clear liquid diet and tolerating well- he consumed 100% of jello, 50% of broth, and a few sips of gingerale.   Due to ETOH withdrawal and mental status, RD will hold off on education until pt is clinically improved and better able to engage and be receptive to education. Of note, pt was also provided very detailed diet education during prior hospitalization on 07/22/16.   Nutrition-Focused  physical exam completed. Findings are no fat depletion, no muscle depletion, and no edema.   Labs reviewed: Na: 134, CBGS: 199-273 (inpatient orders for glycemic control are 0-15 units insulin aspart every 4 hours and 15 units insulin glargine daily).   Body mass index is 24.41 kg/m. Patient meets criteria for normal weight range based on current BMI.   Current diet order is clear liquid, patient is consuming approximately 75% of meals at this time. Labs and medications reviewed.   No nutrition interventions warranted at this time. If nutrition issues arise, please consult RD.   Mykiah Schmuck A. Mayford KnifeWilliams, RD, LDN, CDE Pager: [phone] After hours Pager: [phone]

## 2017-02-07 NOTE — Progress Notes (Addendum)
PROGRESS NOTE    Shawn Watkins   ZOX:[id number]  DOB: 1980-05-19  DOA: 02/06/2017 PCP: Patient, No Pcp Per   Brief Narrative:  Shawn Watkins is a 36 y/o with chronic alcohol abuse (about 12 yrs) who presents to the [facility] ER for vomiting, diarrhea and abdominal pain. Lactic acid 6, CT abd/pelvis >> CBD dilatation and possibly choledocholithiasis. He was transferred to [facility] for possible ERCP.   Subjective: No further vomiting or abdominal pain. He is nauseated today but tolerating clear liquids. No diarrhea. Have had a extensive discussion about his alcohol abuse, fatty liver, chronic pancreatitis and diabetes. I have urged him strongly to stop drinking and have given him details in regards to his new diagnoses.    Assessment & Plan:   Principal Problem: CBD obstruction Acute pancreatitis - per GI, he does not seem to have a bile duct obstruction due to a stone but may have compression due to acute on chronic pancreatitis or a distal CBD stricture in relation to chronic pancreatitis - MRCP ordered - Zosyn stopped by GI and Clear liquids started  Active Problems: Chronic pancreatitis - discussed this with patient- advised to d/c alcohol use completely  Alcohol abuse, Alcoholic hepatitis - following LFTs for improvement - follow for withdrawal  DM2 - A1c 9.8 - he has never been told that he has diabetes, no h/o diabetes in the family- suspect this may be due to chronic pancreatitis  - dietician consult - consult to diabetes coordinator for education - he has no insurance - CM consult for meds - currently on Lantus and SSI- follow response    Tobacco abuse - counseled to quit- cont Nicotine patch  Dehydration - cont IVF   DVT prophylaxis: SCDs Code Status: Full code Family Communication:  Disposition Plan: home when stable Consultants:   GI Procedures:    Antimicrobials:  Anti-infectives (From admission, onward)   Start     Dose/Rate Route  Frequency Ordered Stop   02/06/17 2000  piperacillin-tazobactam (ZOSYN) IVPB 3.375 g  Status:  Discontinued     3.375 g 12.5 mL/hr over 240 Minutes Intravenous Every 8 hours 02/06/17 1145 02/07/17 1010   02/06/17 1145  piperacillin-tazobactam (ZOSYN) IVPB 3.375 g     3.375 g 100 mL/hr over 30 Minutes Intravenous  Once 02/06/17 1133 02/06/17 1240       Objective: Vitals:   02/07/17 0802 02/07/17 0900 02/07/17 1216 02/07/17 1217  BP:    123/88  Pulse: (!) 106 100    Resp:      Temp: 97.6 F (36.4 C)  98.2 F (36.8 C)   TempSrc: Oral  Oral   SpO2:      Weight:      Height:        Intake/Output Summary (Last 24 hours) at 02/07/2017 1319 Last data filed at 02/07/2017 1030 Gross per 24 hour  Intake 1846.67 ml  Output 1050 ml  Net 796.67 ml   Filed Weights   02/06/17 0806 02/06/17 1355  Weight: 83.9 kg (185 lb) 83.9 kg (185 lb)    Examination: General exam: Appears comfortable  HEENT: PERRLA, oral mucosa moist, no sclera icterus or thrush Respiratory system: Clear to auscultation. Respiratory effort normal. Cardiovascular system: S1 & S2 heard, RRR.  No murmurs  Gastrointestinal system: Abdomen soft, non-tender, nondistended. Normal bowel sound. No organomegaly Central nervous system: Alert and oriented. No focal neurological deficits. Extremities: No cyanosis, clubbing or edema Skin: No rashes or ulcers Psychiatry:  Mood &  affect appropriate.     Data Reviewed: I have personally reviewed following labs and imaging studies  CBC: Recent Labs  Lab 02/06/17 0800 02/07/17 0247  WBC 8.2 6.4  NEUTROABS 6.2 4.9  HGB 14.5 11.5*  HCT 41.5 32.5*  MCV 84.0 84.9  PLT 171 117*   Basic Metabolic Panel: Recent Labs  Lab 02/06/17 0800 02/07/17 0247  NA 133* 134*  K 3.5 3.6  CL 80* 95*  CO2 22 26  GLUCOSE 372* 285*  BUN 12 14  CREATININE 0.75 0.86  CALCIUM 9.4 8.6*  MG 2.2  --    GFR: Estimated Creatinine Clearance: 135.5 mL/min (by C-G formula based on SCr of  0.86 mg/dL). Liver Function Tests: Recent Labs  Lab 02/06/17 0800 02/07/17 0247  AST 737* 234*  ALT 216* 144*  ALKPHOS 957* 695*  BILITOT 4.5* 2.7*  PROT 8.8* 6.4*  ALBUMIN 5.0 3.8   Recent Labs  Lab 02/06/17 0800  LIPASE 95*   No results for input(s): AMMONIA in the last 168 hours. Coagulation Profile: Recent Labs  Lab 02/06/17 0800 02/07/17 0247  INR 1.20 1.37   Cardiac Enzymes: Recent Labs  Lab 02/06/17 0800 02/06/17 1310  TROPONINI 0.03* 0.03*   BNP (last 3 results) No results for input(s): PROBNP in the last 8760 hours. HbA1C: Recent Labs    02/06/17 1310  HGBA1C 9.8*   CBG: Recent Labs  Lab 02/06/17 1944 02/06/17 2316 02/07/17 0327 02/07/17 0825 02/07/17 1214  GLUCAP 210* 199* 273* 222* 266*   Lipid Profile: No results for input(s): CHOL, HDL, LDLCALC, TRIG, CHOLHDL, LDLDIRECT in the last 72 hours. Thyroid Function Tests: No results for input(s): TSH, T4TOTAL, FREET4, T3FREE, THYROIDAB in the last 72 hours. Anemia Panel: No results for input(s): VITAMINB12, FOLATE, FERRITIN, TIBC, IRON, RETICCTPCT in the last 72 hours. Urine analysis:    Component Value Date/Time   COLORURINE AMBER (A) 11/15/2016 1400   APPEARANCEUR HAZY (A) 11/15/2016 1400   LABSPEC 1.031 (H) 11/15/2016 1400   PHURINE 5.0 11/15/2016 1400   GLUCOSEU 50 (A) 11/15/2016 1400   HGBUR NEGATIVE 11/15/2016 1400   BILIRUBINUR MODERATE (A) 11/15/2016 1400   KETONESUR 5 (A) 11/15/2016 1400   PROTEINUR 30 (A) 11/15/2016 1400   UROBILINOGEN 1.0 06/06/2010 0949   NITRITE NEGATIVE 11/15/2016 1400   LEUKOCYTESUR NEGATIVE 11/15/2016 1400   Sepsis Labs: @LABRCNTIP (procalcitonin:4,lacticidven:4) ) Recent Results (from the past 240 hour(s))  Blood Culture (routine x 2)     Status: None (Preliminary result)   Collection Time: 02/06/17 10:00 AM  Result Value Ref Range Status   Specimen Description BLOOD LEFT ANTECUBITAL  Final   Special Requests   Final    BOTTLES DRAWN AEROBIC AND  ANAEROBIC Blood Culture adequate volume   Culture NO GROWTH < 24 HOURS  Final   Report Status PENDING  Incomplete  Blood Culture (routine x 2)     Status: None (Preliminary result)   Collection Time: 02/06/17 11:31 AM  Result Value Ref Range Status   Specimen Description BLOOD LEFT ANTECUBITAL  Final   Special Requests   Final    BOTTLES DRAWN AEROBIC AND ANAEROBIC Blood Culture adequate volume   Culture NO GROWTH < 24 HOURS  Final   Report Status PENDING  Incomplete  MRSA PCR Screening     Status: None   Collection Time: 02/06/17  4:30 PM  Result Value Ref Range Status   MRSA by PCR NEGATIVE NEGATIVE Final    Comment:  The GeneXpert MRSA Assay (FDA approved for NASAL specimens only), is one component of a comprehensive MRSA colonization surveillance program. It is not intended to diagnose MRSA infection nor to guide or monitor treatment for MRSA infections.          Radiology Studies: Dg Chest 2 View  Result Date: 02/06/2017 CLINICAL DATA:  36 year old male with fever, cough, chest pain, vomiting for several days EXAM: CHEST  2 VIEW COMPARISON:  Recent prior chest x-ray and CT scan of the chest 07/27/2016 FINDINGS: The lungs are clear and negative for focal airspace consolidation, pulmonary edema or suspicious pulmonary nodule. No pleural effusion or pneumothorax. Cardiac and mediastinal contours are within normal limits. No acute fracture or lytic or blastic osseous lesions. The visualized upper abdominal bowel gas pattern is unremarkable. IMPRESSION: Negative chest x-ray Electronically Signed   By: Malachy Moan M.D.   On: 02/06/2017 09:25   US Abdomen Limited Ruq  Result Date: 02/06/2017 CLINICAL DATA:  Three-day history of fever, nausea and vomiting, and cough. Abnormal liver function tests. EXAM: ULTRASOUND ABDOMEN LIMITED RIGHT UPPER QUADRANT COMPARISON:  MRI abdomen/ MRCP 07/20/2016. Right upper quadrant abdominal ultrasound 07/18/2016. CT abdomen and pelvis  07/17/2016. FINDINGS: Gallbladder: Mildly distended. No shadowing gallstones or echogenic sludge. No gallbladder wall thickening or pericholecystic fluid. Negative sonographic Murphy's sign according to the ultrasound technologist. Common bile duct: Diameter: Approximately 10 mm, significantly increased in caliber since the prior ultrasound where it measured 2 mm. Diffuse dilation of the common bile duct which can be followed to the pancreatic head. Multiple small shadowing calcifications are identified in the distal common duct. Liver: Diffusely increased and coarsened echotexture without focal hepatic parenchymal abnormality. Portal vein is patent on color Doppler imaging with normal direction of blood flow towards the liver. IMPRESSION: 1. Possible choledocholithiasis. Dilation of the common bile duct up to 10 mm diameter, most likely indicating obstruction. 2. Diffuse hepatic steatosis and/or hepatocellular disease without focal hepatic parenchymal abnormality. 3. Mildly distended gallbladder without visible gallstones. Electronically Signed   By: Hulan Saas M.D.   On: 02/06/2017 11:26      Scheduled Meds: . folic acid  1 mg Oral Daily  . insulin aspart  0-15 Units Subcutaneous Q4H  . insulin glargine  15 Units Subcutaneous Daily  . LORazepam  0-4 mg Intravenous Q2H  . multivitamin with minerals  1 tablet Oral Daily  . nicotine  21 mg Transdermal Daily  . pantoprazole (PROTONIX) IV  40 mg Intravenous Q24H  . thiamine  100 mg Oral Daily   Or  . thiamine  100 mg Intravenous Daily   Continuous Infusions: . sodium chloride 100 mL/hr at 02/06/17 1900     LOS: 1 day    Time spent in minutes: 40    Calvert Cantor, MD [facility] Pager: [email or web address] Password [email or web address] 02/07/2017, 1:19 PM

## 2017-02-08 DIAGNOSIS — R17 Unspecified jaundice: Secondary | ICD-10-CM

## 2017-02-08 DIAGNOSIS — F10232 Alcohol dependence with withdrawal with perceptual disturbance: Secondary | ICD-10-CM

## 2017-02-08 DIAGNOSIS — K852 Alcohol induced acute pancreatitis without necrosis or infection: Principal | ICD-10-CM

## 2017-02-08 LAB — CBC WITH DIFFERENTIAL/PLATELET
Basophils Absolute: 0 10*3/uL (ref 0.0–0.1)
Basophils Relative: 0 %
EOS ABS: 0.1 10*3/uL (ref 0.0–0.7)
EOS PCT: 1 %
HCT: 32.6 % — ABNORMAL LOW (ref 39.0–52.0)
HEMOGLOBIN: 11.1 g/dL — AB (ref 13.0–17.0)
LYMPHS ABS: 1.4 10*3/uL (ref 0.7–4.0)
LYMPHS PCT: 27 %
MCH: 29.1 pg (ref 26.0–34.0)
MCHC: 34 g/dL (ref 30.0–36.0)
MCV: 85.6 fL (ref 78.0–100.0)
MONOS PCT: 6 %
Monocytes Absolute: 0.3 10*3/uL (ref 0.1–1.0)
NEUTROS PCT: 66 %
Neutro Abs: 3.4 10*3/uL (ref 1.7–7.7)
Platelets: 119 10*3/uL — ABNORMAL LOW (ref 150–400)
RBC: 3.81 MIL/uL — AB (ref 4.22–5.81)
RDW: 12.5 % (ref 11.5–15.5)
WBC: 5.2 10*3/uL (ref 4.0–10.5)

## 2017-02-08 LAB — BASIC METABOLIC PANEL
Anion gap: 7 (ref 5–15)
BUN: 12 mg/dL (ref 6–20)
CHLORIDE: 103 mmol/L (ref 101–111)
CO2: 24 mmol/L (ref 22–32)
CREATININE: 0.68 mg/dL (ref 0.61–1.24)
Calcium: 8.8 mg/dL — ABNORMAL LOW (ref 8.9–10.3)
GFR calc Af Amer: >60 mL/min (ref >60)
GFR calc non Af Amer: >60 mL/min (ref >60)
GLUCOSE: 231 mg/dL — AB (ref 65–99)
POTASSIUM: 3.9 mmol/L (ref 3.5–5.1)
SODIUM: 134 mmol/L — AB (ref 135–145)

## 2017-02-08 LAB — GLUCOSE, CAPILLARY
GLUCOSE-CAPILLARY: 133 mg/dL — AB (ref 65–99)
Glucose-Capillary: 125 mg/dL — ABNORMAL HIGH (ref 65–99)
Glucose-Capillary: 184 mg/dL — ABNORMAL HIGH (ref 65–99)
Glucose-Capillary: 347 mg/dL — ABNORMAL HIGH (ref 65–99)
Glucose-Capillary: 93 mg/dL (ref 65–99)

## 2017-02-08 LAB — COMPREHENSIVE METABOLIC PANEL
ALT: 104 U/L — AB (ref 17–63)
AST: 115 U/L — AB (ref 15–41)
Albumin: 3.5 g/dL (ref 3.5–5.0)
Alkaline Phosphatase: 565 U/L — ABNORMAL HIGH (ref 38–126)
Anion gap: 8 (ref 5–15)
BUN: 13 mg/dL (ref 6–20)
CHLORIDE: 101 mmol/L (ref 101–111)
CO2: 25 mmol/L (ref 22–32)
CREATININE: 0.61 mg/dL (ref 0.61–1.24)
Calcium: 8.7 mg/dL — ABNORMAL LOW (ref 8.9–10.3)
GFR calc Af Amer: >60 mL/min (ref >60)
Glucose, Bld: 111 mg/dL — ABNORMAL HIGH (ref 65–99)
POTASSIUM: 2.7 mmol/L — AB (ref 3.5–5.1)
SODIUM: 134 mmol/L — AB (ref 135–145)
Total Bilirubin: 1.5 mg/dL — ABNORMAL HIGH (ref 0.3–1.2)
Total Protein: 6 g/dL — ABNORMAL LOW (ref 6.5–8.1)

## 2017-02-08 LAB — MAGNESIUM: Magnesium: 1.9 mg/dL (ref 1.7–2.4)

## 2017-02-08 LAB — PROTIME-INR
INR: 1.33
Prothrombin Time: 16.4 seconds — ABNORMAL HIGH (ref 11.4–15.2)

## 2017-02-08 MED ORDER — POTASSIUM CHLORIDE CRYS ER 20 MEQ PO TBCR
20.0000 meq | EXTENDED_RELEASE_TABLET | Freq: Once | ORAL | Status: AC
  Administered 2017-02-08: 20 meq via ORAL
  Filled 2017-02-08: qty 1

## 2017-02-08 MED ORDER — GLIMEPIRIDE 4 MG PO TABS: 2.0000 mg | ORAL_TABLET | Freq: Every day | ORAL | Status: DC

## 2017-02-08 MED ORDER — POTASSIUM CHLORIDE 10 MEQ/100ML IV SOLN
10.0000 meq | INTRAVENOUS | Status: AC
  Administered 2017-02-08 (×3): 10 meq via INTRAVENOUS
  Filled 2017-02-08 (×3): qty 100

## 2017-02-08 MED ORDER — SODIUM CHLORIDE 0.9 % IV SOLN
INTRAVENOUS | Status: DC
  Administered 2017-02-08 – 2017-02-09 (×2): via INTRAVENOUS

## 2017-02-08 MED ORDER — POTASSIUM CHLORIDE CRYS ER 20 MEQ PO TBCR
40.0000 meq | EXTENDED_RELEASE_TABLET | ORAL | Status: AC
  Administered 2017-02-08 (×2): 40 meq via ORAL
  Filled 2017-02-08 (×2): qty 2

## 2017-02-08 MED ORDER — PANCRELIPASE (LIP-PROT-AMYL) 12000-38000 UNITS PO CPEP
36000.0000 [IU] | ORAL_CAPSULE | Freq: Three times a day (TID) | ORAL | Status: DC
  Administered 2017-02-08 – 2017-02-10 (×6): 36000 [IU] via ORAL
  Filled 2017-02-08 (×6): qty 3

## 2017-02-08 MED ORDER — GLIMEPIRIDE 4 MG PO TABS
8.0000 mg | ORAL_TABLET | Freq: Every day | ORAL | Status: DC
  Filled 2017-02-08: qty 2

## 2017-02-08 MED ORDER — INSULIN ASPART 100 UNIT/ML ~~LOC~~ SOLN
0.0000 [IU] | Freq: Three times a day (TID) | SUBCUTANEOUS | Status: DC
  Administered 2017-02-08: 11 [IU] via SUBCUTANEOUS
  Administered 2017-02-08: 2 [IU] via SUBCUTANEOUS
  Administered 2017-02-09: 3 [IU] via SUBCUTANEOUS
  Administered 2017-02-09: 2 [IU] via SUBCUTANEOUS
  Administered 2017-02-09: 3 [IU] via SUBCUTANEOUS
  Administered 2017-02-10: 8 [IU] via SUBCUTANEOUS

## 2017-02-08 NOTE — Progress Notes (Addendum)
PROGRESS NOTE    Shawn Watkins   JXB:[id number]  DOB: 1980-10-04  DOA: 02/06/2017 PCP: Patient, No Pcp Per   Brief Narrative:  Shawn Watkins is a 36 y/o with chronic alcohol abuse (about 12 yrs) who presents to the [facility] ER for vomiting, diarrhea and abdominal pain. Lactic acid 6, CT abd/pelvis >> CBD dilatation and possibly choledocholithiasis. He was transferred to [facility] for possible ERCP.   Subjective: No nausea, abdominal pain or diarrhea today.   Assessment & Plan:   Principal Problem: CBD obstruction Acute pancreatitis - per GI, he does not seem to have a bile duct obstruction due to a stone but may have compression due to acute on chronic pancreatitis or a distal CBD stricture in relation to chronic pancreatitis - will need MRCP   - Zosyn stopped by GI and Clear liquids started - I have had a extensive discussion about his alcohol abuse, fatty liver, acute and chronic pancreatitis and diabetes. I have urged him strongly to stop drinking.  Active Problems: Chronic pancreatitis - Creon started- discussed this diagnosis with patient in detail- advised to d/c alcohol use completely  Alcohol abuse, Alcoholic hepatitis - following LFTs for improvement - withdrawal being managed with Ativan  DM2 - A1c 9.8 - he has never been told that he has diabetes, no h/o diabetes in the family- suspect this may be due to chronic pancreatitis  - dietician consult - consult to diabetes coordinator for education - he has no insurance - CM consult for meds - have place him on Amaryl today - cont SSI- follow response  Thrombocytopenia - likely due to ETOH abuse  Hypokalemia - replace today- Mg+ is normal  Tobacco abuse - counseled to quit- cont Nicotine patch  Dehydration - cont IVF   DVT prophylaxis: SCDs Code Status: Full code Family Communication:  Disposition Plan: home when stable Consultants:   GI Procedures:    Antimicrobials:  Anti-infectives  (From admission, onward)   Start     Dose/Rate Route Frequency Ordered Stop   02/06/17 2000  piperacillin-tazobactam (ZOSYN) IVPB 3.375 g  Status:  Discontinued     3.375 g 12.5 mL/hr over 240 Minutes Intravenous Every 8 hours 02/06/17 1145 02/07/17 1010   02/06/17 1145  piperacillin-tazobactam (ZOSYN) IVPB 3.375 g     3.375 g 100 mL/hr over 30 Minutes Intravenous  Once 02/06/17 1133 02/06/17 1240       Objective: Vitals:   02/07/17 2316 02/08/17 0335 02/08/17 0730 02/08/17 1152  BP: 122/90 124/80 127/86   Pulse:      Resp: 14 16 19 18   Temp: 98.4 F (36.9 C) 98.5 F (36.9 C) 98.5 F (36.9 C) 98.5 F (36.9 C)  TempSrc: Oral Oral Oral Oral  SpO2: 99% 98% 97% 98%  Weight:      Height:        Intake/Output Summary (Last 24 hours) at 02/08/2017 1344 Last data filed at 02/08/2017 1300 Gross per 24 hour  Intake 3668.06 ml  Output 1000 ml  Net 2668.06 ml   Filed Weights   02/06/17 0806 02/06/17 1355  Weight: 83.9 kg (185 lb) 83.9 kg (185 lb)    Examination: General exam: Appears comfortable  HEENT: PERRLA, oral mucosa moist, no sclera icterus or thrush Respiratory system: Clear to auscultation. Respiratory effort normal. Cardiovascular system: S1 & S2 heard, RRR.  No murmurs  Gastrointestinal system: Abdomen soft, non-tender, nondistended. Normal bowel sound. No organomegaly Central nervous system: Alert and oriented. No focal neurological  deficits. Extremities: No cyanosis, clubbing or edema Skin: No rashes or ulcers Psychiatry:  Mood & affect appropriate.     Data Reviewed: I have personally reviewed following labs and imaging studies  CBC: Recent Labs  Lab 02/06/17 0800 02/07/17 0247 02/08/17 0227  WBC 8.2 6.4 5.2  NEUTROABS 6.2 4.9 3.4  HGB 14.5 11.5* 11.1*  HCT 41.5 32.5* 32.6*  MCV 84.0 84.9 85.6  PLT 171 117* 119*   Basic Metabolic Panel: Recent Labs  Lab 02/06/17 0800 02/07/17 0247 02/08/17 0227 02/08/17 0800  NA 133* 134* 134*  --   K 3.5  3.6 2.7*  --   CL 80* 95* 101  --   CO2 22 26 25   --   GLUCOSE 372* 285* 111*  --   BUN 12 14 13   --   CREATININE 0.75 0.86 0.61  --   CALCIUM 9.4 8.6* 8.7*  --   MG 2.2  --   --  1.9   GFR: Estimated Creatinine Clearance: 145.7 mL/min (by C-G formula based on SCr of 0.61 mg/dL). Liver Function Tests: Recent Labs  Lab 02/06/17 0800 02/07/17 0247 02/08/17 0227  AST 737* 234* 115*  ALT 216* 144* 104*  ALKPHOS 957* 695* 565*  BILITOT 4.5* 2.7* 1.5*  PROT 8.8* 6.4* 6.0*  ALBUMIN 5.0 3.8 3.5   Recent Labs  Lab 02/06/17 0800  LIPASE 95*   No results for input(s): AMMONIA in the last 168 hours. Coagulation Profile: Recent Labs  Lab 02/06/17 0800 02/07/17 0247 02/08/17 0227  INR 1.20 1.37 1.33   Cardiac Enzymes: Recent Labs  Lab 02/06/17 0800 02/06/17 1310  TROPONINI 0.03* 0.03*   BNP (last 3 results) No results for input(s): PROBNP in the last 8760 hours. HbA1C: Recent Labs    02/06/17 1310  HGBA1C 9.8*   CBG: Recent Labs  Lab 02/07/17 1932 02/07/17 2313 02/08/17 0026 02/08/17 0309 02/08/17 1151  GLUCAP 178* 67 93 125* 347*   Lipid Profile: No results for input(s): CHOL, HDL, LDLCALC, TRIG, CHOLHDL, LDLDIRECT in the last 72 hours. Thyroid Function Tests: No results for input(s): TSH, T4TOTAL, FREET4, T3FREE, THYROIDAB in the last 72 hours. Anemia Panel: No results for input(s): VITAMINB12, FOLATE, FERRITIN, TIBC, IRON, RETICCTPCT in the last 72 hours. Urine analysis:    Component Value Date/Time   COLORURINE AMBER (A) 11/15/2016 1400   APPEARANCEUR HAZY (A) 11/15/2016 1400   LABSPEC 1.031 (H) 11/15/2016 1400   PHURINE 5.0 11/15/2016 1400   GLUCOSEU 50 (A) 11/15/2016 1400   HGBUR NEGATIVE 11/15/2016 1400   BILIRUBINUR MODERATE (A) 11/15/2016 1400   KETONESUR 5 (A) 11/15/2016 1400   PROTEINUR 30 (A) 11/15/2016 1400   UROBILINOGEN 1.0 06/06/2010 0949   NITRITE NEGATIVE 11/15/2016 1400   LEUKOCYTESUR NEGATIVE 11/15/2016 1400   Sepsis  Labs: @LABRCNTIP (procalcitonin:4,lacticidven:4) ) Recent Results (from the past 240 hour(s))  Blood Culture (routine x 2)     Status: None (Preliminary result)   Collection Time: 02/06/17 10:00 AM  Result Value Ref Range Status   Specimen Description BLOOD LEFT ANTECUBITAL  Final   Special Requests   Final    BOTTLES DRAWN AEROBIC AND ANAEROBIC Blood Culture adequate volume   Culture NO GROWTH 2 DAYS  Final   Report Status PENDING  Incomplete  Blood Culture (routine x 2)     Status: None (Preliminary result)   Collection Time: 02/06/17 11:31 AM  Result Value Ref Range Status   Specimen Description BLOOD LEFT ANTECUBITAL  Final   Special Requests  Final    BOTTLES DRAWN AEROBIC AND ANAEROBIC Blood Culture adequate volume   Culture NO GROWTH 2 DAYS  Final   Report Status PENDING  Incomplete  MRSA PCR Screening     Status: None   Collection Time: 02/06/17  4:30 PM  Result Value Ref Range Status   MRSA by PCR NEGATIVE NEGATIVE Final    Comment:        The GeneXpert MRSA Assay (FDA approved for NASAL specimens only), is one component of a comprehensive MRSA colonization surveillance program. It is not intended to diagnose MRSA infection nor to guide or monitor treatment for MRSA infections.          Radiology Studies: No results found.    Scheduled Meds: . folic acid  1 mg Oral Daily  . [START ON 02/09/2017] glimepiride  2 mg Oral Q breakfast  . insulin aspart  0-15 Units Subcutaneous TID WC  . lipase/protease/amylase  36,000 Units Oral TID AC  . LORazepam  0-4 mg Intravenous Q2H  . multivitamin with minerals  1 tablet Oral Daily  . nicotine  21 mg Transdermal Daily  . pantoprazole (PROTONIX) IV  40 mg Intravenous Q24H  . thiamine  100 mg Oral Daily   Or  . thiamine  100 mg Intravenous Daily   Continuous Infusions: . sodium chloride 100 mL/hr at 02/08/17 0945     LOS: 2 days    Time spent in minutes: 40    Calvert CantorSaima Tinea Nobile, MD [facility] Pager: [email or web address] Password [email or web address] 02/08/2017, 1:44 PM

## 2017-02-08 NOTE — Progress Notes (Signed)
Transferred to 5west room23 by wheelchair,stable. Report given to RN. Belongings with pt.

## 2017-02-08 NOTE — Progress Notes (Signed)
Shawn Watkins is a 36 y.o. male patient admitted from ED awake, alert - oriented  X 4 - no acute distress noted.  VSS - Blood pressure 119/80, pulse 98, temperature 98.7 F (37.1 C), temperature source Oral, resp. rate 18, height 6\' 1"  (1.854 m), weight 84.3 kg (185 lb 13.6 oz), SpO2 98 %.    IV in place, occlusive dsg intact without redness.  Orientation to room, and floor completed with information packet given to patient/family.  Patient declined safety video at this time.  Admission INP armband ID verified with patient/family, and in place.   SR up x 2, fall assessment complete, with patient and family able to verbalize understanding of risk associated with falls, and verbalized understanding to call nsg before up out of bed.  Call light within reach, patient able to voice, and demonstrate understanding.  Skin, clean-dry- intact without evidence of bruising, or skin tears.   No evidence of skin break down noted on exam.     Will cont to eval and treat per MD orders.  Caren HazyKamila A Zyir Gassert, RN 02/08/2017 7:27 PM

## 2017-02-08 NOTE — Progress Notes (Addendum)
CRITICAL VALUE ALERT  Critical Value:  Potassium 2.7  Date & Time Notied: 02/08/17 0408  Provider Notified: On call Craige CottaKirby NP  Orders Received/Actions taken: 20 mEq PO potassium and IVPB 10 mEq x 4. Will continue to monitor.

## 2017-02-08 NOTE — Progress Notes (Signed)
[facility] Progress Note  Chief Complaint: Jaundice  Subjective  History:  Patient's alcohol withdrawal appears to be subsiding, but with regular dosing of ativan. He denies abdominal pain or nausea He was made NPO for MRCP this morning.  ROS: Cardiovascular:  no chest pain Respiratory: no dyspnea  Objective:  Med list reviewed  Vital signs in last 24 hrs: Vitals:   02/08/17 0335 02/08/17 0730  BP: 124/80 127/86  Pulse:    Resp: 16 19  Temp: 98.5 F (36.9 C) 98.5 F (36.9 C)  SpO2: 98% 97%    Physical Exam   HEENT: sclera icteric, oral mucosa moist without lesions.  Very animated and complaining that he is hungry.  Feels that he is not being informed of his plan.  Neck: supple, no thyromegaly, JVD or lymphadenopathy  Cardiac: RRR without murmurs, S1S2 heard, no peripheral edema  Pulm: clear to auscultation bilaterally, normal RR and effort noted  Abdomen: soft, no tenderness, with active bowel sounds. No guarding or palpable hepatosplenomegaly  Skin; warm and dry, + jaundice, no rash  Recent Labs:  Recent Labs  Lab 02/06/17 0800 02/07/17 0247 02/08/17 0227  WBC 8.2 6.4 5.2  HGB 14.5 11.5* 11.1*  HCT 41.5 32.5* 32.6*  PLT 171 117* 119*   Recent Labs  Lab 02/08/17 0227  NA 134*  K 2.7*  CL 101  CO2 25  BUN 13  ALBUMIN 3.5  ALKPHOS 565*  ALT 104*  AST 115*  GLUCOSE 111*   Recent Labs  Lab 02/08/17 0227  INR 1.33   LFTs are improved today  @ASSESSMENTPLANBEGIN @ Assessment:  Jaundice - suspected underlying biliary stricture from chronic pancreatitis. Chronic pancreatitis Initial sepsis-like picture and concern for cholangitis.  I do not think he had cholangitis, so I stopped his zosyn yesterday. New Dx of diabetes.  He is still in active alcohol withdrawal, and I am concerned that he may not be able to lay still enough and hold breath well to get the best quality MRCP images.  Plan: I have discontinued his MRCP for today and started  a low fat diet.  We will re-evaluate and determine timing of the study. He needs considerable potassium repletion.   Shawn Watkins Pager [phone] Mon-Fri 8a-5p [phone] after 5p, weekends, holidays

## 2017-02-08 NOTE — Progress Notes (Signed)
Hypoglycemic Event  CBG: 67  Treatment: D50 IV 25 mL  Symptoms: None  Follow-up CBG: Time:0026 CBG Result: 93  Possible Reasons for Event: Inadequate meal intake     Irwin BrakemanKanisha N Lateshia Schmoker

## 2017-02-09 ENCOUNTER — Encounter (HOSPITAL_COMMUNITY): Payer: Self-pay | Admitting: Physician Assistant

## 2017-02-09 ENCOUNTER — Inpatient Hospital Stay (HOSPITAL_COMMUNITY): Payer: Self-pay

## 2017-02-09 DIAGNOSIS — R740 Nonspecific elevation of levels of transaminase and lactic acid dehydrogenase [LDH]: Secondary | ICD-10-CM

## 2017-02-09 LAB — COMPREHENSIVE METABOLIC PANEL
ALBUMIN: 3.2 g/dL — AB (ref 3.5–5.0)
ALBUMIN: 3.5 g/dL (ref 3.5–5.0)
ALK PHOS: 474 U/L — AB (ref 38–126)
ALT: 84 U/L — ABNORMAL HIGH (ref 17–63)
ALT: 92 U/L — ABNORMAL HIGH (ref 17–63)
ANION GAP: 5 (ref 5–15)
ANION GAP: 7 (ref 5–15)
AST: 68 U/L — ABNORMAL HIGH (ref 15–41)
AST: 83 U/L — AB (ref 15–41)
Alkaline Phosphatase: 491 U/L — ABNORMAL HIGH (ref 38–126)
BILIRUBIN TOTAL: 0.9 mg/dL (ref 0.3–1.2)
BUN: 10 mg/dL (ref 6–20)
BUN: 9 mg/dL (ref 6–20)
CALCIUM: 8.7 mg/dL — AB (ref 8.9–10.3)
CHLORIDE: 103 mmol/L (ref 101–111)
CO2: 23 mmol/L (ref 22–32)
CO2: 23 mmol/L (ref 22–32)
Calcium: 8.8 mg/dL — ABNORMAL LOW (ref 8.9–10.3)
Chloride: 106 mmol/L (ref 101–111)
Creatinine, Ser: 0.67 mg/dL (ref 0.61–1.24)
Creatinine, Ser: 0.63 mg/dL (ref 0.61–1.24)
GFR calc Af Amer: >60 mL/min (ref >60)
GFR calc non Af Amer: >60 mL/min (ref >60)
GFR calc non Af Amer: >60 mL/min (ref >60)
GLUCOSE: 242 mg/dL — AB (ref 65–99)
GLUCOSE: 159 mg/dL — AB (ref 65–99)
POTASSIUM: 4.1 mmol/L (ref 3.5–5.1)
POTASSIUM: 3.5 mmol/L (ref 3.5–5.1)
SODIUM: 134 mmol/L — AB (ref 135–145)
SODIUM: 133 mmol/L — AB (ref 135–145)
TOTAL PROTEIN: 5.8 g/dL — AB (ref 6.5–8.1)
TOTAL PROTEIN: 6.2 g/dL — AB (ref 6.5–8.1)
Total Bilirubin: 0.9 mg/dL (ref 0.3–1.2)

## 2017-02-09 LAB — PROTIME-INR
INR: 1.33
Prothrombin Time: 16.3 seconds — ABNORMAL HIGH (ref 11.4–15.2)

## 2017-02-09 LAB — URINALYSIS, ROUTINE W REFLEX MICROSCOPIC
Bacteria, UA: NONE SEEN
Bilirubin Urine: NEGATIVE
GLUCOSE, UA: 50 mg/dL — AB
KETONES UR: 5 mg/dL — AB
LEUKOCYTES UA: NEGATIVE
NITRITE: NEGATIVE
PROTEIN: NEGATIVE mg/dL
RBC / HPF: NONE SEEN RBC/hpf (ref 0–5)
Specific Gravity, Urine: 1.008 (ref 1.005–1.030)
Squamous Epithelial / LPF: NONE SEEN
pH: 7 (ref 5.0–8.0)

## 2017-02-09 LAB — GLUCOSE, CAPILLARY
GLUCOSE-CAPILLARY: 196 mg/dL — AB (ref 65–99)
GLUCOSE-CAPILLARY: 173 mg/dL — AB (ref 65–99)
GLUCOSE-CAPILLARY: 161 mg/dL — AB (ref 65–99)
Glucose-Capillary: 274 mg/dL — ABNORMAL HIGH (ref 65–99)
Glucose-Capillary: 142 mg/dL — ABNORMAL HIGH (ref 65–99)
Glucose-Capillary: 266 mg/dL — ABNORMAL HIGH (ref 65–99)

## 2017-02-09 LAB — CBC WITH DIFFERENTIAL/PLATELET
BASOS ABS: 0 10*3/uL (ref 0.0–0.1)
BASOS PCT: 0 %
Eosinophils Absolute: 0.1 10*3/uL (ref 0.0–0.7)
Eosinophils Relative: 1 %
HEMATOCRIT: 32.2 % — AB (ref 39.0–52.0)
HEMOGLOBIN: 11.2 g/dL — AB (ref 13.0–17.0)
LYMPHS PCT: 27 %
Lymphs Abs: 1.3 10*3/uL (ref 0.7–4.0)
MCH: 29.9 pg (ref 26.0–34.0)
MCHC: 34.8 g/dL (ref 30.0–36.0)
MCV: 85.9 fL (ref 78.0–100.0)
MONO ABS: 0.4 10*3/uL (ref 0.1–1.0)
MONOS PCT: 8 %
NEUTROS ABS: 3.1 10*3/uL (ref 1.7–7.7)
NEUTROS PCT: 64 %
Platelets: 129 10*3/uL — ABNORMAL LOW (ref 150–400)
RBC: 3.75 MIL/uL — ABNORMAL LOW (ref 4.22–5.81)
RDW: 12.5 % (ref 11.5–15.5)
WBC: 4.8 10*3/uL (ref 4.0–10.5)

## 2017-02-09 LAB — RAPID URINE DRUG SCREEN, HOSP PERFORMED
Amphetamines: NOT DETECTED
Barbiturates: NOT DETECTED
Benzodiazepines: POSITIVE — AB
Cocaine: NOT DETECTED
Opiates: NOT DETECTED
Tetrahydrocannabinol: NOT DETECTED

## 2017-02-09 MED ORDER — GLIMEPIRIDE 4 MG PO TABS
4.0000 mg | ORAL_TABLET | Freq: Every day | ORAL | Status: DC
  Administered 2017-02-09 – 2017-02-10 (×2): 4 mg via ORAL
  Filled 2017-02-09 (×2): qty 1

## 2017-02-09 MED ORDER — ZOLPIDEM TARTRATE 5 MG PO TABS
5.0000 mg | ORAL_TABLET | Freq: Every evening | ORAL | Status: DC | PRN
  Filled 2017-02-09: qty 1

## 2017-02-09 MED ORDER — GLIMEPIRIDE 4 MG PO TABS: 4.0000 mg | ORAL_TABLET | Freq: Every day | ORAL | Status: DC

## 2017-02-09 MED ORDER — LIVING WELL WITH DIABETES BOOK
Freq: Once | Status: AC
Start: 2017-02-09 — End: 2017-02-09
  Administered 2017-02-09: 17:00:00
  Filled 2017-02-09: qty 1

## 2017-02-09 MED ORDER — GADOBENATE DIMEGLUMINE 529 MG/ML IV SOLN
10.0000 mL | Freq: Once | INTRAVENOUS | Status: AC | PRN
  Administered 2017-02-09: 10 mL via INTRAVENOUS

## 2017-02-09 MED ORDER — PANTOPRAZOLE SODIUM 40 MG PO TBEC
40.0000 mg | DELAYED_RELEASE_TABLET | Freq: Every day | ORAL | Status: DC
  Administered 2017-02-09: 40 mg via ORAL
  Filled 2017-02-09: qty 1

## 2017-02-09 NOTE — Progress Notes (Signed)
[email or web address] Education provided for [employer] meetings with meeting places.

## 2017-02-09 NOTE — Progress Notes (Signed)
Inpatient Diabetes Program Recommendations  AACE/ADA: New Consensus Statement on Inpatient Glycemic Control (2015)  Target Ranges:  Prepandial:   less than 140 mg/dL      Peak postprandial:   less than 180 mg/dL (1-2 hours)      Critically ill patients:  140 - 180 mg/dL   Lab Results  Component Value Date   GLUCAP 161 (H) 02/09/2017   HGBA1C 9.8 (H) 02/06/2017   Results for [name], [name] (MRN [id number]) as of 02/09/2017 14:54  Ref. Range 02/08/2017 20:36 02/09/2017 01:15 02/09/2017 05:40 02/09/2017 09:26 02/09/2017 12:22  Glucose-Capillary Latest Ref Range: 65 - 99 mg/dL 130184 (H) 865274 (H) 784196 (H) 173 (H) 161 (H)   Diabetes history: DM- this is not new. It was discussed with patient in detail in June of 2018 by Diabetes Coordinator Outpatient Diabetes medications: None Current orders for Inpatient glycemic control:  Novolog moderate tid with meals, Amaryl 4 mg daily  Inpatient Diabetes Program Recommendations:    Note history.  Discussed A1C with patient and DM.  He states "this has happened before, but it got better when I improved my lifestyle".  Patient admits that he was recently on a 2 week drinking binge which he thinks has made his blood sugars elevated.  Per chart review, patient has had 5 hospital encounters due to elevated liver enzymes and ETOH abuse.  It does not appear that he has had treatment for ETOH abuse?  Will place social work referral for potential resources for patient outpatient for ETOH?  Also may benefit from Psych consult?    Briefly discussed monitoring with patient and normal blood sugar levels.  Explained that ETOH consumption and pancreatitis will often make blood sugars rise. In order to successfully treat elevated blood sugars/DM, patient needs to stop drinking.  Note that MD has discussed this with patient as well, however he will need close outpatient support in order to be successful.   Thanks,  Beryl MeagerJenny Ara Grandmaison, RN, BC-ADM Inpatient Diabetes  Coordinator Pager [phone] (8a-5p)

## 2017-02-09 NOTE — Progress Notes (Signed)
PROGRESS NOTE    Shawn Watkins   VQQ:[id number]  DOB: 1980/05/25  DOA: 02/06/2017 PCP: Patient, No Pcp Per   Brief Narrative:  Shawn Watkins is a 36 y/o with chronic alcohol abuse (about 12 yrs) who presents to the [facility] ER for vomiting, diarrhea and abdominal pain. Lactic acid 6, CT abd/pelvis >> CBD dilatation and possibly choledocholithiasis. He was transferred to [facility] for possible ERCP.   Subjective:  having some shakes and sweats from withdrawal. No other complaint. Tolerating food without nausea, vomiting or diarrhea.   Assessment & Plan:   Principal Problem: CBD obstruction Acute pancreatitis - per GI, he does not seem to have a bile duct obstruction due to a stone but may have compression due to acute on chronic pancreatitis or a distal CBD stricture in relation to chronic pancreatitis -  MRCP: acute pancreatitis with fluid collection, ? Necrotic tissue, CBD difficult to visualize due to amount of pancreatic edema - Zosyn stopped by GI and Clear liquids started- now on solid food   Active Problems: Chronic pancreatitis - Creon started- discussed this diagnosis with patient in detail   Alcohol abuse, Alcoholic hepatitis - LFTs improving - withdrawal being managed with Ativan- CIWA was 10 this AM - I have multiple extensive discussions about his alcohol abuse, fatty liver, acute and chronic pancreatitis and diabetes. I have urged him strongly to stop drinking and join a support group to help prevent relapse.  DM2 - A1c 9.8 - he has never been told that he has diabetes, no h/o diabetes in the family- suspect this may be due to chronic pancreatitis  - dietician consult - consult to diabetes coordinator for education - he has no insurance - CM consult for meds - have place him on Amaryl  - cont SSI- follow response to Amaryl- adjust to 4 mg today  Thrombocytopenia - likely due to ETOH abuse  Hypokalemia - replace today- Mg+ is normal  Tobacco  abuse - counseled to quit- cont Nicotine patch  Dehydration - cont IVF for now   DVT prophylaxis: SCDs Code Status: Full code Family Communication:  Disposition Plan: home when stable Consultants:   GI Procedures:    Antimicrobials:  Anti-infectives (From admission, onward)   Start     Dose/Rate Route Frequency Ordered Stop   02/06/17 2000  piperacillin-tazobactam (ZOSYN) IVPB 3.375 g  Status:  Discontinued     3.375 g 12.5 mL/hr over 240 Minutes Intravenous Every 8 hours 02/06/17 1145 02/07/17 1010   02/06/17 1145  piperacillin-tazobactam (ZOSYN) IVPB 3.375 g     3.375 g 100 mL/hr over 30 Minutes Intravenous  Once 02/06/17 1133 02/06/17 1240       Objective: Vitals:   02/08/17 1845 02/08/17 2039 02/09/17 0549 02/09/17 1218  BP: 119/80 131/79 121/76 (!) 133/92  Pulse: 98 85 75 100  Resp: 18 16 16    Temp: 98.7 F (37.1 C) 98.4 F (36.9 C) 97.9 F (36.6 C)   TempSrc: Oral Oral    SpO2: 98% 96% 98%   Weight: 84.3 kg (185 lb 13.6 oz)     Height: 6\' 1"  (1.854 m)       Intake/Output Summary (Last 24 hours) at 02/09/2017 1233 Last data filed at 02/09/2017 1036 Gross per 24 hour  Intake 2246.67 ml  Output -  Net 2246.67 ml   Filed Weights   02/06/17 0806 02/06/17 1355 02/08/17 1845  Weight: 83.9 kg (185 lb) 83.9 kg (185 lb) 84.3 kg (185 lb 13.6  oz)    Examination: General exam: Appears comfortable  HEENT: PERRLA, oral mucosa moist, no sclera icterus or thrush Respiratory system: Clear to auscultation. Respiratory effort normal. Cardiovascular system: S1 & S2 heard, RRR.  No murmurs  Gastrointestinal system: Abdomen soft, non-tender, nondistended. Normal bowel sound. No organomegaly Central nervous system: Alert and oriented. No focal neurological deficits. Extremities: No cyanosis, clubbing or edema Skin: No rashes or ulcers Psychiatry:  Mood & affect appropriate.     Data Reviewed: I have personally reviewed following labs and imaging  studies  CBC: Recent Labs  Lab 02/06/17 0800 02/07/17 0247 02/08/17 0227 02/09/17 0258  WBC 8.2 6.4 5.2 4.8  NEUTROABS 6.2 4.9 3.4 3.1  HGB 14.5 11.5* 11.1* 11.2*  HCT 41.5 32.5* 32.6* 32.2*  MCV 84.0 84.9 85.6 85.9  PLT 171 117* 119* 129*   Basic Metabolic Panel: Recent Labs  Lab 02/06/17 0800 02/07/17 0247 02/08/17 0227 02/08/17 0800 02/08/17 1444 02/09/17 0258  NA 133* 134* 134*  --  134* 134*  K 3.5 3.6 2.7*  --  3.9 4.1  CL 80* 95* 101  --  103 106  CO2 22 26 25   --  24 23  GLUCOSE 372* 285* 111*  --  231* 242*  BUN 12 14 13   --  12 10  CREATININE 0.75 0.86 0.61  --  0.68 0.67  CALCIUM 9.4 8.6* 8.7*  --  8.8* 8.7*  MG 2.2  --   --  1.9  --   --    GFR: Estimated Creatinine Clearance: 145.7 mL/min (by C-G formula based on SCr of 0.67 mg/dL). Liver Function Tests: Recent Labs  Lab 02/06/17 0800 02/07/17 0247 02/08/17 0227 02/09/17 0258  AST 737* 234* 115* 68*  ALT 216* 144* 104* 84*  ALKPHOS 957* 695* 565* 474*  BILITOT 4.5* 2.7* 1.5* 0.9  PROT 8.8* 6.4* 6.0* 5.8*  ALBUMIN 5.0 3.8 3.5 3.2*   Recent Labs  Lab 02/06/17 0800  LIPASE 95*   No results for input(s): AMMONIA in the last 168 hours. Coagulation Profile: Recent Labs  Lab 02/06/17 0800 02/07/17 0247 02/08/17 0227 02/09/17 0258  INR 1.20 1.37 1.33 1.33   Cardiac Enzymes: Recent Labs  Lab 02/06/17 0800 02/06/17 1310  TROPONINI 0.03* 0.03*   BNP (last 3 results) No results for input(s): PROBNP in the last 8760 hours. HbA1C: Recent Labs    02/06/17 1310  HGBA1C 9.8*   CBG: Recent Labs  Lab 02/08/17 2036 02/09/17 0115 02/09/17 0540 02/09/17 0926 02/09/17 1222  GLUCAP 184* 274* 196* 173* 161*   Lipid Profile: No results for input(s): CHOL, HDL, LDLCALC, TRIG, CHOLHDL, LDLDIRECT in the last 72 hours. Thyroid Function Tests: No results for input(s): TSH, T4TOTAL, FREET4, T3FREE, THYROIDAB in the last 72 hours. Anemia Panel: No results for input(s): VITAMINB12, FOLATE,  FERRITIN, TIBC, IRON, RETICCTPCT in the last 72 hours. Urine analysis:    Component Value Date/Time   COLORURINE AMBER (A) 11/15/2016 1400   APPEARANCEUR HAZY (A) 11/15/2016 1400   LABSPEC 1.031 (H) 11/15/2016 1400   PHURINE 5.0 11/15/2016 1400   GLUCOSEU 50 (A) 11/15/2016 1400   HGBUR NEGATIVE 11/15/2016 1400   BILIRUBINUR MODERATE (A) 11/15/2016 1400   KETONESUR 5 (A) 11/15/2016 1400   PROTEINUR 30 (A) 11/15/2016 1400   UROBILINOGEN 1.0 06/06/2010 0949   NITRITE NEGATIVE 11/15/2016 1400   LEUKOCYTESUR NEGATIVE 11/15/2016 1400   Sepsis Labs: @LABRCNTIP (procalcitonin:4,lacticidven:4) ) Recent Results (from the past 240 hour(s))  Blood Culture (routine x 2)  Status: None (Preliminary result)   Collection Time: 02/06/17 10:00 AM  Result Value Ref Range Status   Specimen Description BLOOD LEFT ANTECUBITAL  Final   Special Requests   Final    BOTTLES DRAWN AEROBIC AND ANAEROBIC Blood Culture adequate volume   Culture NO GROWTH 3 DAYS  Final   Report Status PENDING  Incomplete  Blood Culture (routine x 2)     Status: None (Preliminary result)   Collection Time: 02/06/17 11:31 AM  Result Value Ref Range Status   Specimen Description BLOOD LEFT ANTECUBITAL  Final   Special Requests   Final    BOTTLES DRAWN AEROBIC AND ANAEROBIC Blood Culture adequate volume   Culture NO GROWTH 3 DAYS  Final   Report Status PENDING  Incomplete  MRSA PCR Screening     Status: None   Collection Time: 02/06/17  4:30 PM  Result Value Ref Range Status   MRSA by PCR NEGATIVE NEGATIVE Final    Comment:        The GeneXpert MRSA Assay (FDA approved for NASAL specimens only), is one component of a comprehensive MRSA colonization surveillance program. It is not intended to diagnose MRSA infection nor to guide or monitor treatment for MRSA infections.          Radiology Studies: Mr 3d Recon At Scanner  Result Date: 02/09/2017 CLINICAL DATA:  Vomiting, diarrhea, and abdominal pain.  Dilated common bile duct. EXAM: MRI ABDOMEN WITHOUT AND WITH CONTRAST (INCLUDING MRCP) TECHNIQUE:   IMPRESSION: 1. New 3.6 by 1.5 cm complex fluid collection or centrally necrotic mass in the pancreatic head and uncinate process. No definite gas internally. Possibilities might include pseudocyst or abscess. Pancreatic necrosis is not entirely excluded, and correlation with the severity of the patient's clinical scenario is recommended ingesting the aggressiveness of therapy. This area is highly obscured by surrounding pancreatitis and motion artifact as well as some signal loss in the region ; these factors obscure the pancreas and surrounding lesions to a great extent, and reduced specificity in evaluating the new abnormal components of the appearance of the pancreas. 2. There is also a separate complex 2.3 by 1.3 cm lesion just below and posterior to the pancreas which does not appear to enhance them which has high precontrast T1 signal characteristics, likely a small complex pseudocyst. 3. Acute on chronic pancreatitis. 4. Mild CBD dilatation up to 7 mm, with tapering in the vicinity of the pancreatic head probably from extrinsic compression. I do not see any intrinsic lesion in the CBD although the distal CBD is severely obscured and difficulty even visualize in the area of the pancreatic head. 5.  Prominent stool throughout the colon favors constipation. 6. Edema in the retroperitoneum, peripancreatic region, and mesentery favoring acute pancreatitis. Electronically Signed   By: Gaylyn Rong M.D.   On: 02/09/2017 10:52   Mr Abdomen Mrcp Vivien Rossetti Contast  Result Date: 02/09/2017 CLINICAL DATA:  Vomiting, diarrhea, and abdominal pain. Dilated common bile duct. EXAM: MRI ABDOMEN WITHOUT AND WITH CONTRAST (INCLUDING MRCP) TECHNIQUE: Multiplanar multisequence MR imaging of the abdomen was performed both before and after the administration of intravenous contrast. Heavily T2-weighted images of the biliary and  pancreatic ducts were obtained, and three-dimensional MRCP images were rendered by post processing. CONTRAST:  10mL MULTIHANCE GADOBENATE DIMEGLUMINE 529 MG/ML IV SOLN COMPARISON:  Ultrasound from 04/09/2016. Other exams including CT scan from 07/27/2016 and prior MRI from 07/20/2016. FINDINGS: Despite efforts by the technologist and patient, motion artifact is present on today's  exam and could not be eliminated. This reduces exam sensitivity and specificity, and is extremely common when MRCP is attempted on acutely ill inpatients. There is dropout of signal on the MRCP images along the common bile duct for example in the vicinity of image 77/12, obscuring the biliary tree. Lower chest: Dependent atelectasis in the lungs. Hepatobiliary: Gallbladder unremarkable. Diffuse hepatic steatosis with some geographic scope bearing. Tapered distal CBD along the pancreatic head without an obvious filling defect. Pancreas: Complex 3.6 by 1.5 cm fluid collection or centrally necrotic mass in the pancreatic head extending into the uncinate process. No definite associated enhancement on the subtraction images. There is narrowing of the common bile duct and dorsal pancreatic duct in the vicinity of this lesion. Severe ill definition of pancreatic margins with surrounding edema probably from pancreatitis. Posterior to the uncinate process and anterior to the abdominal aorta of there is a 2.3 by 1.3 cm T2 hyperintense lesion on image 37/6 with high precontrast T1 signal characteristics but no appreciable enhancement. Spleen:  Unremarkable Adrenals/Urinary Tract:  Unremarkable Stomach/Bowel: Prominent stool throughout the colon favors constipation. No dilated small bowel identified. Vascular/Lymphatic: Unremarkable. Patent portal vein and splenic vein. Other: Low-level edema in the retroperitoneum, peripancreatic region, and mesentery. Musculoskeletal: Unremarkable IMPRESSION: 1. New 3.6 by 1.5 cm complex fluid collection or centrally  necrotic mass in the pancreatic head and uncinate process. No definite gas internally. Possibilities might include pseudocyst or abscess. Pancreatic necrosis is not entirely excluded, and correlation with the severity of the patient's clinical scenario is recommended ingesting the aggressiveness of therapy. This area is highly obscured by surrounding pancreatitis and motion artifact as well as some signal loss in the region ; these factors obscure the pancreas and surrounding lesions to a great extent, and reduced specificity in evaluating the new abnormal components of the appearance of the pancreas. 2. There is also a separate complex 2.3 by 1.3 cm lesion just below and posterior to the pancreas which does not appear to enhance them which has high precontrast T1 signal characteristics, likely a small complex pseudocyst. 3. Acute on chronic pancreatitis. 4. Mild CBD dilatation up to 7 mm, with tapering in the vicinity of the pancreatic head probably from extrinsic compression. I do not see any intrinsic lesion in the CBD although the distal CBD is severely obscured and difficulty even visualize in the area of the pancreatic head. 5.  Prominent stool throughout the colon favors constipation. 6. Edema in the retroperitoneum, peripancreatic region, and mesentery favoring acute pancreatitis. Electronically Signed   By: Gaylyn RongWalter  Liebkemann M.D.   On: 02/09/2017 10:52      Scheduled Meds: . folic acid  1 mg Oral Daily  . glimepiride  4 mg Oral Q breakfast  . insulin aspart  0-15 Units Subcutaneous TID WC  . lipase/protease/amylase  36,000 Units Oral TID AC  . living well with diabetes book   Does not apply Once  . LORazepam  0-4 mg Intravenous Q2H  . multivitamin with minerals  1 tablet Oral Daily  . nicotine  21 mg Transdermal Daily  . pantoprazole  40 mg Oral Daily  . thiamine  100 mg Oral Daily   Or  . thiamine  100 mg Intravenous Daily   Continuous Infusions: . sodium chloride 100 mL/hr at 02/09/17  0657     LOS: 3 days    Time spent in minutes: 40    Calvert CantorSaima Carin Shipp, MD [facility] Pager: [email or web address] Password [email or web address] 02/09/2017, 12:33 PM

## 2017-02-09 NOTE — Progress Notes (Addendum)
Daily Rounding Note  02/09/2017, 11:49 AM  LOS: 3 days   SUBJECTIVE:   Chief complaint:     Tolerating carb mod solid foods.  Denies severe abd pain, has not received pain meds since Toradol d/c'd yesterday late evening.   Ativan detox protocol still in place and he is feeling better. No IVF running currently No BMs for a few days  OBJECTIVE:         Vital signs in last 24 hours:    Temp:  [97.9 F (36.6 C)-98.7 F (37.1 C)] 97.9 F (36.6 C) (12/26 0549) Pulse Rate:  [75-98] 75 (12/26 0549) Resp:  [16-18] 16 (12/26 0549) BP: (113-131)/(76-80) 121/76 (12/26 0549) SpO2:  [96 %-98 %] 98 % (12/26 0549) Weight:  [84.3 kg (185 lb 13.6 oz)] 84.3 kg (185 lb 13.6 oz) (12/25 1845) Last BM Date: 02/08/17 Filed Weights   02/06/17 0806 02/06/17 1355 02/08/17 1845  Weight: 83.9 kg (185 lb) 83.9 kg (185 lb) 84.3 kg (185 lb 13.6 oz)   General: not ill looking.  Comfortable, a bit anxious   Heart: RRR Chest: clear bil.  No dyspnea or cough Abdomen: Soft, NT, ND. BS hypoactive.   Extremities: no CCE Neuro/Psych:  Oriented x 3, slightly anxious, no gross weakness.  No tremors.    Intake/Output from previous day: 12/25 0701 - 12/26 0700 In: 2706.7 [P.O.:120; I.V.:2486.7; IV Piggyback:100] Out: -   Intake/Output this shift: Total I/O In: 240 [P.O.:240] Out: -   Lab Results: Recent Labs    02/07/17 0247 02/08/17 0227 02/09/17 0258  WBC 6.4 5.2 4.8  HGB 11.5* 11.1* 11.2*  HCT 32.5* 32.6* 32.2*  PLT 117* 119* 129*   BMET Recent Labs    02/08/17 0227 02/08/17 1444 02/09/17 0258  NA 134* 134* 134*  K 2.7* 3.9 4.1  CL 101 103 106  CO2 25 24 23   GLUCOSE 111* 231* 242*  BUN 13 12 10   CREATININE 0.61 0.68 0.67  CALCIUM 8.7* 8.8* 8.7*   LFT Recent Labs    02/07/17 0247 02/08/17 0227 02/09/17 0258  PROT 6.4* 6.0* 5.8*  ALBUMIN 3.8 3.5 3.2*  AST 234* 115* 68*  ALT 144* 104* 84*  ALKPHOS 695* 565* 474*    BILITOT 2.7* 1.5* 0.9   PT/INR Recent Labs    02/08/17 0227 02/09/17 0258  LABPROT 16.4* 16.3*  INR 1.33 1.33   Hepatitis Panel No results for input(s): HEPBSAG, HCVAB, HEPAIGM, HEPBIGM in the last 72 hours.  Studies/Results: Mr 3d Recon At Scanner Mr Abdomen Mrcp Vivien RossettiW Wo Contast  Result Date: 02/09/2017 CLINICAL DATA:  Vomiting, diarrhea, and abdominal pain. Dilated common bile duct. EXAM: MRI ABDOMEN WITHOUT AND WITH CONTRAST (INCLUDING MRCP) TECHNIQUE: Multiplanar multisequence MR imaging of the abdomen was performed both before and after the administration of intravenous contrast. Heavily T2-weighted images of the biliary and pancreatic ducts were obtained, and three-dimensional MRCP images were rendered by post processing. CONTRAST:  10mL MULTIHANCE GADOBENATE DIMEGLUMINE 529 MG/ML IV SOLN COMPARISON:  Ultrasound from 04/09/2016. Other exams including CT scan from 07/27/2016 and prior MRI from 07/20/2016. FINDINGS: Despite efforts by the technologist and patient, motion artifact is present on today's exam and could not be eliminated. This reduces exam sensitivity and specificity, and is extremely common when MRCP is attempted on acutely ill inpatients. There is dropout of signal on the MRCP images along the common bile duct for example in the vicinity of image 77/12, obscuring the biliary tree.  Lower chest: Dependent atelectasis in the lungs. Hepatobiliary: Gallbladder unremarkable. Diffuse hepatic steatosis with some geographic scope bearing. Tapered distal CBD along the pancreatic head without an obvious filling defect. Pancreas: Complex 3.6 by 1.5 cm fluid collection or centrally necrotic mass in the pancreatic head extending into the uncinate process. No definite associated enhancement on the subtraction images. There is narrowing of the common bile duct and dorsal pancreatic duct in the vicinity of this lesion. Severe ill definition of pancreatic margins with surrounding edema probably  from pancreatitis. Posterior to the uncinate process and anterior to the abdominal aorta of there is a 2.3 by 1.3 cm T2 hyperintense lesion on image 37/6 with high precontrast T1 signal characteristics but no appreciable enhancement. Spleen:  Unremarkable Adrenals/Urinary Tract:  Unremarkable Stomach/Bowel: Prominent stool throughout the colon favors constipation. No dilated small bowel identified. Vascular/Lymphatic: Unremarkable. Patent portal vein and splenic vein. Other: Low-level edema in the retroperitoneum, peripancreatic region, and mesentery. Musculoskeletal: Unremarkable IMPRESSION: 1. New 3.6 by 1.5 cm complex fluid collection or centrally necrotic mass in the pancreatic head and uncinate process. No definite gas internally. Possibilities might include pseudocyst or abscess. Pancreatic necrosis is not entirely excluded, and correlation with the severity of the patient's clinical scenario is recommended ingesting the aggressiveness of therapy. This area is highly obscured by surrounding pancreatitis and motion artifact as well as some signal loss in the region ; these factors obscure the pancreas and surrounding lesions to a great extent, and reduced specificity in evaluating the new abnormal components of the appearance of the pancreas. 2. There is also a separate complex 2.3 by 1.3 cm lesion just below and posterior to the pancreas which does not appear to enhance them which has high precontrast T1 signal characteristics, likely a small complex pseudocyst. 3. Acute on chronic pancreatitis. 4. Mild CBD dilatation up to 7 mm, with tapering in the vicinity of the pancreatic head probably from extrinsic compression. I do not see any intrinsic lesion in the CBD although the distal CBD is severely obscured and difficulty even visualize in the area of the pancreatic head. 5.  Prominent stool throughout the colon favors constipation. 6. Edema in the retroperitoneum, peripancreatic region, and mesentery favoring  acute pancreatitis. Electronically Signed   By: Gaylyn RongWalter  Liebkemann M.D.   On: 02/09/2017 10:52    ASSESMENT:   *  Jaundice.  ETOH hepatitis.  Acute on chronic pancreatitis.    LFTs improved.  MRCP: Acute on chronic pancreatitis.  Fluid collection, possibly necrotic, at HOP/uncinate; ifficult to evaluate for other lesions in this area due to surrounding pancreatitis.  Separate, small complex Likely pseudocyst behind pancreas.   7 mm CBD, distal tapering likely from extrinsic compression, but this area also difficult to vis  *  Chronic pancreatitis  *  ETOH abuse.  Active ETOH withdrawal clinically improved with Ativan detox.      *  Hypokalemia, resolved.    *  DM, type 2.  New dx this admission   PLAN   *  Supportive care.  Mgt of pancreatic fluid collections per Dr Myrtie Neitheranis.      Jennye MoccasinSarah Gribbin  02/09/2017, 11:49 AM Pager: [phone]  I have discussed the case with the PA, and that is the plan I formulated. I personally interviewed and examined the patient.  Mr. Shawn PaneBates is looking well today, he is still quite anxious as before, but he appears to be through the worst of his alcohol withdrawal. He is tolerating regular food without abdominal pain nausea or  vomiting. I reviewed the results of his MRCP. He has to pseudocysts, one of which may have internal necrosis. I do not think there is active infection, and I do not think it is a neoplasm since nothing of this sort was seen on an MR abdomen/MRCP in June of this year. 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 He therefore does not need an ERCP, and his LFTs have stabilized I suspect is his new baseline. I discussed all this with him and answered all of his questions. He remains quite anxious and uncertain about his future. In summary, he needs complete alcohol abstinence and I think he can be safely discharged tomorrow if internal  medicine agrees that he is through his alcohol withdrawal. Consider follow-up with the internal medicine resident clinic since the patient needs ongoing care for his diabetes and chronic pancreatitis and he is currently uninsured.  Total time 30 minutes, over half spent counseling this patient. Charlie Pitter III Pager [phone]  Mon-Fri 8a-5p [phone] after 5p, weekends, holidays

## 2017-02-10 DIAGNOSIS — E872 Acidosis, unspecified: Secondary | ICD-10-CM

## 2017-02-10 DIAGNOSIS — K852 Alcohol induced acute pancreatitis without necrosis or infection: Secondary | ICD-10-CM

## 2017-02-10 DIAGNOSIS — E876 Hypokalemia: Secondary | ICD-10-CM

## 2017-02-10 LAB — CBC WITH DIFFERENTIAL/PLATELET
Basophils Absolute: 0 10*3/uL (ref 0.0–0.1)
Basophils Relative: 1 %
EOS PCT: 2 %
Eosinophils Absolute: 0.1 10*3/uL (ref 0.0–0.7)
HCT: 33.5 % — ABNORMAL LOW (ref 39.0–52.0)
Hemoglobin: 11.6 g/dL — ABNORMAL LOW (ref 13.0–17.0)
LYMPHS ABS: 1.2 10*3/uL (ref 0.7–4.0)
LYMPHS PCT: 28 %
MCH: 29.6 pg (ref 26.0–34.0)
MCHC: 34.6 g/dL (ref 30.0–36.0)
MCV: 85.5 fL (ref 78.0–100.0)
MONO ABS: 0.4 10*3/uL (ref 0.1–1.0)
Monocytes Relative: 10 %
Neutro Abs: 2.7 10*3/uL (ref 1.7–7.7)
Neutrophils Relative %: 59 %
PLATELETS: 176 10*3/uL (ref 150–400)
RBC: 3.92 MIL/uL — ABNORMAL LOW (ref 4.22–5.81)
RDW: 13 % (ref 11.5–15.5)
WBC: 4.4 10*3/uL (ref 4.0–10.5)

## 2017-02-10 LAB — COMPREHENSIVE METABOLIC PANEL
ALBUMIN: 3.5 g/dL (ref 3.5–5.0)
ALT: 81 U/L — AB (ref 17–63)
AST: 63 U/L — AB (ref 15–41)
Alkaline Phosphatase: 440 U/L — ABNORMAL HIGH (ref 38–126)
Anion gap: 9 (ref 5–15)
BILIRUBIN TOTAL: 0.9 mg/dL (ref 0.3–1.2)
BUN: 8 mg/dL (ref 6–20)
CHLORIDE: 101 mmol/L (ref 101–111)
CO2: 23 mmol/L (ref 22–32)
CREATININE: 0.62 mg/dL (ref 0.61–1.24)
Calcium: 8.6 mg/dL — ABNORMAL LOW (ref 8.9–10.3)
GFR calc Af Amer: >60 mL/min (ref >60)
GFR calc non Af Amer: >60 mL/min (ref >60)
GLUCOSE: 287 mg/dL — AB (ref 65–99)
POTASSIUM: 3.6 mmol/L (ref 3.5–5.1)
Sodium: 133 mmol/L — ABNORMAL LOW (ref 135–145)
TOTAL PROTEIN: 6.2 g/dL — AB (ref 6.5–8.1)

## 2017-02-10 LAB — LACTIC ACID, PLASMA: Lactic Acid, Venous: 1.5 mmol/L (ref 0.5–1.9)

## 2017-02-10 LAB — GLUCOSE, CAPILLARY
GLUCOSE-CAPILLARY: 89 mg/dL (ref 65–99)
Glucose-Capillary: 267 mg/dL — ABNORMAL HIGH (ref 65–99)

## 2017-02-10 MED ORDER — PANCRELIPASE (LIP-PROT-AMYL) 36000-114000 UNITS PO CPEP: 36000.0000 [IU] | ORAL_CAPSULE | Freq: Three times a day (TID) | ORAL | 3 refills | Status: DC

## 2017-02-10 MED ORDER — METFORMIN HCL 500 MG PO TABS: 500.0000 mg | ORAL_TABLET | Freq: Two times a day (BID) | ORAL | 0 refills | Status: DC

## 2017-02-10 MED ORDER — FREESTYLE SYSTEM KIT: 1.0000 | PACK | Freq: Three times a day (TID) | 1 refills | Status: DC

## 2017-02-10 MED ORDER — GLIMEPIRIDE 4 MG PO TABS: 6.0000 mg | ORAL_TABLET | Freq: Every day | ORAL | 3 refills | Status: DC

## 2017-02-10 MED ORDER — GLIMEPIRIDE 4 MG PO TABS: 6.0000 mg | ORAL_TABLET | Freq: Every day | ORAL | Status: DC

## 2017-02-10 MED ORDER — METFORMIN HCL 500 MG PO TABS
500.0000 mg | ORAL_TABLET | Freq: Two times a day (BID) | ORAL | Status: DC
Start: 2017-02-10 — End: 2017-02-10
  Administered 2017-02-10: 500 mg via ORAL
  Filled 2017-02-10: qty 1

## 2017-02-10 MED ORDER — THERA VITAL M PO TABS: 1.0000 | ORAL_TABLET | Freq: Every day | ORAL | 3 refills | Status: DC

## 2017-02-10 NOTE — Discharge Summary (Addendum)
Physician Discharge Summary  Shawn Watkins VQM:[id number] DOB: 1980/03/18 DOA: 02/06/2017  PCP: Patient, No Pcp Per  Admit date: 02/06/2017 Discharge date: 02/10/2017  Admitted From: home Disposition:  home   Recommendations for Outpatient Follow-up:  1. Will need close f/u of diabetes- he has been explained that if sugars are not controlled on oral meds, he will need to start insulin 2. Once patient able to get insurance, can start some of the newer diabetic agents. For now, have prescribed from [facility] drug list Discharge Condition:  stable   CODE STATUS:  Full code   Consultations:  GI    Discharge Diagnoses:  Active Problems:   Acute alcoholic hepatitis   Alcohol induced acute pancreatitis   Type 2 diabetes mellitus (HCC)   Transaminitis   Tobacco abuse   Alcohol abuse and withdrawl   Severe dehydration   Fever and chills   Hypokalemia   Lactic acidosis    Subjective: He has no complaints of abdominal, nausea, vomiting or diarrhea. No tremors, hallucinations or anxiety.   Brief Summary: Shawn Watkins is a 36 y/o with chronic alcohol abuse (about 12 yrs) who presents to the [facility] for vomiting, diarrhea and abdominal pain. Lactic acid 6, CT abd/pelvis >> CBD dilatation and possibly choledocholithiasis. He was transferred to [facility] for possible ERCP.    Hospital Course:  CBD obstruction?? Acute pancreatitis - per GI, he does not seem to have a bile duct obstruction due to a stone but may have compression due to edema in relation to acute vs a distal CBD stricture in relation to chronic pancreatitis -  MRCP: acute pancreatitis with fluid collection, ? Necrotic tissue, CBD difficult to visualize due to amount of pancreatic edema- see report below - Zosyn stopped by GI and Clear liquids started- has been tolerating solid food   Active Problems: Chronic pancreatitis? - Creon started by GI - discussed this diagnosis with patient in detail  - have  explained acute and chronic pancreatitis to the patient in detail and the relationship with alcohol abuse and diabetes  Alcohol abuse, Alcoholic hepatitis - LFTs improving steadily  - withdrawal managed with Ativan and has resolved - I have multiple extensive discussions about his alcohol abuse, fatty liver, acute and chronic pancreatitis and diabetes. I have urged him strongly to stop drinking and join a support group to help prevent relapse.  DM2 - A1c 9.8 - he has never been told that he has diabetes, no h/o diabetes in the family- suspect this may be due to chronic pancreatitis in setting of alcohol use  - I have educated him in regards to diabetes and its management - dietician and diabetes coordinator consulted for education - he has no insurance - CM consult for meds - have place him on Amaryl  - cont SSI- follow response to Amaryl- adjust to 6 mg today - have started Metformin and have strictly advised him to to stop if he starts drinking alcohol again- at this point, he will need to f/u with his PCP for a prescription for a second agent  Thrombocytopenia - likely due to ETOH abuse  Hypokalemia - replace today- Mg+ is normal  Tobacco abuse - counseled to quit- cont Nicotine patch  Dehydration - cont IVF for now    Discharge Instructions  Discharge Instructions    Diet - low sodium heart healthy   Complete by:  As directed    Diet Carb Modified   Complete by:  As directed  Increase activity slowly   Complete by:  As directed      Allergies as of 02/10/2017   No Known Allergies     Medication List    TAKE these medications   glimepiride 4 MG tablet Commonly known as:  AMARYL Take 1.5 tablets (6 mg total) by mouth daily with breakfast. Start taking on:  02/11/2017   glucose monitoring kit monitoring kit 1 each by Does not apply route 4 (four) times daily - after meals and at bedtime. 1 month Diabetic Testing Supplies for QAC-QHS accuchecks.    ibuprofen 200 MG tablet Commonly known as:  ADVIL,MOTRIN Take 400-600 mg by mouth every 6 (six) hours as needed for moderate pain.   lipase/protease/amylase 36000 UNITS Cpep capsule Commonly known as:  CREON Take 1 capsule (36,000 Units total) by mouth 3 (three) times daily before meals.   metFORMIN 500 MG tablet Commonly known as:  GLUCOPHAGE Take 1 tablet (500 mg total) by mouth 2 (two) times daily with a meal.   multivitamin tablet Take 1 tablet by mouth daily.      Follow-up Information    Rehman, Mechele Dawley, MD. Schedule an appointment as soon as possible for a visit in 3 week(s).   Specialty:  Gastroenterology Why:  make appointment to follow up liver problems, to be seen within next 3 to 4 weeks.  please call to cancel any appointments if unable to make it to MD visit.   Contact information: [address], [address] [place] [address] [phone]        [facility]. Go on 02/17/2017.   Specialty:  Internal Medicine Why:   Post hospital  follow up scheduled with Cammie Sickle NP on 02/18/2016 at 9:30am Contact information: [place] [phone]         No Known Allergies   Procedures/Studies:  Dg Chest 2 View  Result Date: 02/06/2017 CLINICAL DATA:  36 year old male with fever, cough, chest pain, vomiting for several days EXAM: CHEST  2 VIEW COMPARISON:  Recent prior chest x-ray and CT scan of the chest 07/27/2016 FINDINGS: The lungs are clear and negative for focal airspace consolidation, pulmonary edema or suspicious pulmonary nodule. No pleural effusion or pneumothorax. Cardiac and mediastinal contours are within normal limits. No acute fracture or lytic or blastic osseous lesions. The visualized upper abdominal bowel gas pattern is unremarkable. IMPRESSION: Negative chest x-ray Electronically Signed   By: Jacqulynn Cadet M.D.   On: 02/06/2017 09:25   Mr 3d Recon At Scanner  Result Date:  02/09/2017 CLINICAL DATA:  Vomiting, diarrhea, and abdominal pain. Dilated common bile duct. EXAM: MRI ABDOMEN WITHOUT AND WITH CONTRAST (INCLUDING MRCP) TECHNIQUE: Multiplanar multisequence MR imaging of the abdomen was performed both before and after the administration of intravenous contrast. Heavily T2-weighted images of the biliary and pancreatic ducts were obtained, and three-dimensional MRCP images were rendered by post processing. CONTRAST:  55m MULTIHANCE GADOBENATE DIMEGLUMINE 529 MG/ML IV SOLN COMPARISON:  Ultrasound from 04/09/2016. Other exams including CT scan from 07/27/2016 and prior MRI from 07/20/2016. FINDINGS: Despite efforts by the technologist and patient, motion artifact is present on today's exam and could not be eliminated. This reduces exam sensitivity and specificity, and is extremely common when MRCP is attempted on acutely ill inpatients. There is dropout of signal on the MRCP images along the common bile duct for example in the vicinity of image 77/12, obscuring the biliary tree. Lower chest: Dependent atelectasis  in the lungs. Hepatobiliary: Gallbladder unremarkable. Diffuse hepatic steatosis with some geographic scope bearing. Tapered distal CBD along the pancreatic head without an obvious filling defect. Pancreas: Complex 3.6 by 1.5 cm fluid collection or centrally necrotic mass in the pancreatic head extending into the uncinate process. No definite associated enhancement on the subtraction images. There is narrowing of the common bile duct and dorsal pancreatic duct in the vicinity of this lesion. Severe ill definition of pancreatic margins with surrounding edema probably from pancreatitis. Posterior to the uncinate process and anterior to the abdominal aorta of there is a 2.3 by 1.3 cm T2 hyperintense lesion on image 37/6 with high precontrast T1 signal characteristics but no appreciable enhancement. Spleen:  Unremarkable Adrenals/Urinary Tract:  Unremarkable Stomach/Bowel:  Prominent stool throughout the colon favors constipation. No dilated small bowel identified. Vascular/Lymphatic: Unremarkable. Patent portal vein and splenic vein. Other: Low-level edema in the retroperitoneum, peripancreatic region, and mesentery. Musculoskeletal: Unremarkable IMPRESSION: 1. New 3.6 by 1.5 cm complex fluid collection or centrally necrotic mass in the pancreatic head and uncinate process. No definite gas internally. Possibilities might include pseudocyst or abscess. Pancreatic necrosis is not entirely excluded, and correlation with the severity of the patient's clinical scenario is recommended ingesting the aggressiveness of therapy. This area is highly obscured by surrounding pancreatitis and motion artifact as well as some signal loss in the region ; these factors obscure the pancreas and surrounding lesions to a great extent, and reduced specificity in evaluating the new abnormal components of the appearance of the pancreas. 2. There is also a separate complex 2.3 by 1.3 cm lesion just below and posterior to the pancreas which does not appear to enhance them which has high precontrast T1 signal characteristics, likely a small complex pseudocyst. 3. Acute on chronic pancreatitis. 4. Mild CBD dilatation up to 7 mm, with tapering in the vicinity of the pancreatic head probably from extrinsic compression. I do not see any intrinsic lesion in the CBD although the distal CBD is severely obscured and difficulty even visualize in the area of the pancreatic head. 5.  Prominent stool throughout the colon favors constipation. 6. Edema in the retroperitoneum, peripancreatic region, and mesentery favoring acute pancreatitis. Electronically Signed   By: Van Clines M.D.   On: 02/09/2017 10:52   Mr Abdomen Mrcp Moise Boring Contast  Result Date: 02/09/2017 CLINICAL DATA:  Vomiting, diarrhea, and abdominal pain. Dilated common bile duct. EXAM: MRI ABDOMEN WITHOUT AND WITH CONTRAST (INCLUDING MRCP) TECHNIQUE:  Multiplanar multisequence MR imaging of the abdomen was performed both before and after the administration of intravenous contrast. Heavily T2-weighted images of the biliary and pancreatic ducts were obtained, and three-dimensional MRCP images were rendered by post processing. CONTRAST:  67m MULTIHANCE GADOBENATE DIMEGLUMINE 529 MG/ML IV SOLN COMPARISON:  Ultrasound from 04/09/2016. Other exams including CT scan from 07/27/2016 and prior MRI from 07/20/2016. FINDINGS: Despite efforts by the technologist and patient, motion artifact is present on today's exam and could not be eliminated. This reduces exam sensitivity and specificity, and is extremely common when MRCP is attempted on acutely ill inpatients. There is dropout of signal on the MRCP images along the common bile duct for example in the vicinity of image 77/12, obscuring the biliary tree. Lower chest: Dependent atelectasis in the lungs. Hepatobiliary: Gallbladder unremarkable. Diffuse hepatic steatosis with some geographic scope bearing. Tapered distal CBD along the pancreatic head without an obvious filling defect. Pancreas: Complex 3.6 by 1.5 cm fluid collection or centrally necrotic mass in the pancreatic head  extending into the uncinate process. No definite associated enhancement on the subtraction images. There is narrowing of the common bile duct and dorsal pancreatic duct in the vicinity of this lesion. Severe ill definition of pancreatic margins with surrounding edema probably from pancreatitis. Posterior to the uncinate process and anterior to the abdominal aorta of there is a 2.3 by 1.3 cm T2 hyperintense lesion on image 37/6 with high precontrast T1 signal characteristics but no appreciable enhancement. Spleen:  Unremarkable Adrenals/Urinary Tract:  Unremarkable Stomach/Bowel: Prominent stool throughout the colon favors constipation. No dilated small bowel identified. Vascular/Lymphatic: Unremarkable. Patent portal vein and splenic vein. Other:  Low-level edema in the retroperitoneum, peripancreatic region, and mesentery. Musculoskeletal: Unremarkable IMPRESSION: 1. New 3.6 by 1.5 cm complex fluid collection or centrally necrotic mass in the pancreatic head and uncinate process. No definite gas internally. Possibilities might include pseudocyst or abscess. Pancreatic necrosis is not entirely excluded, and correlation with the severity of the patient's clinical scenario is recommended ingesting the aggressiveness of therapy. This area is highly obscured by surrounding pancreatitis and motion artifact as well as some signal loss in the region ; these factors obscure the pancreas and surrounding lesions to a great extent, and reduced specificity in evaluating the new abnormal components of the appearance of the pancreas. 2. There is also a separate complex 2.3 by 1.3 cm lesion just below and posterior to the pancreas which does not appear to enhance them which has high precontrast T1 signal characteristics, likely a small complex pseudocyst. 3. Acute on chronic pancreatitis. 4. Mild CBD dilatation up to 7 mm, with tapering in the vicinity of the pancreatic head probably from extrinsic compression. I do not see any intrinsic lesion in the CBD although the distal CBD is severely obscured and difficulty even visualize in the area of the pancreatic head. 5.  Prominent stool throughout the colon favors constipation. 6. Edema in the retroperitoneum, peripancreatic region, and mesentery favoring acute pancreatitis. Electronically Signed   By: Van Clines M.D.   On: 02/09/2017 10:52   US Abdomen Limited Ruq  Result Date: 02/06/2017 CLINICAL DATA:  Three-day history of fever, nausea and vomiting, and cough. Abnormal liver function tests. EXAM: ULTRASOUND ABDOMEN LIMITED RIGHT UPPER QUADRANT COMPARISON:  MRI abdomen/ MRCP 07/20/2016. Right upper quadrant abdominal ultrasound 07/18/2016. CT abdomen and pelvis 07/17/2016. FINDINGS: Gallbladder: Mildly  distended. No shadowing gallstones or echogenic sludge. No gallbladder wall thickening or pericholecystic fluid. Negative sonographic Murphy's sign according to the ultrasound technologist. Common bile duct: Diameter: Approximately 10 mm, significantly increased in caliber since the prior ultrasound where it measured 2 mm. Diffuse dilation of the common bile duct which can be followed to the pancreatic head. Multiple small shadowing calcifications are identified in the distal common duct. Liver: Diffusely increased and coarsened echotexture without focal hepatic parenchymal abnormality. Portal vein is patent on color Doppler imaging with normal direction of blood flow towards the liver. IMPRESSION: 1. Possible choledocholithiasis. Dilation of the common bile duct up to 10 mm diameter, most likely indicating obstruction. 2. Diffuse hepatic steatosis and/or hepatocellular disease without focal hepatic parenchymal abnormality. 3. Mildly distended gallbladder without visible gallstones. Electronically Signed   By: Evangeline Dakin M.D.   On: 02/06/2017 11:26       Discharge Exam: Vitals:   02/09/17 2159 02/10/17 0618  BP: (!) 138/96 106/61  Pulse: 91 70  Resp: 18 18  Temp: 98 F (36.7 C) 97.8 F (36.6 C)  SpO2: 98% 97%   Vitals:   02/09/17 1534  02/09/17 1725 02/09/17 2159 02/10/17 0618  BP: 124/87 134/85 (!) 138/96 106/61  Pulse: 78 93 91 70  Resp: '18  18 18  '$ Temp: 98.6 F (37 C)  98 F (36.7 C) 97.8 F (36.6 C)  TempSrc: Oral  Oral Oral  SpO2: 99%  98% 97%  Weight:      Height:        General: Pt is alert, awake, not in acute distress Cardiovascular: RRR, S1/S2 +, no rubs, no gallops Respiratory: CTA bilaterally, no wheezing, no rhonchi Abdominal: Soft, NT, ND, bowel sounds + Extremities: no edema, no cyanosis    The results of significant diagnostics from this hospitalization (including imaging, microbiology, ancillary and laboratory) are listed below for reference.      Microbiology: Recent Results (from the past 240 hour(s))  Blood Culture (routine x 2)     Status: None (Preliminary result)   Collection Time: 02/06/17 10:00 AM  Result Value Ref Range Status   Specimen Description BLOOD LEFT ANTECUBITAL  Final   Special Requests   Final    BOTTLES DRAWN AEROBIC AND ANAEROBIC Blood Culture adequate volume   Culture NO GROWTH 4 DAYS  Final   Report Status PENDING  Incomplete  Blood Culture (routine x 2)     Status: None (Preliminary result)   Collection Time: 02/06/17 11:31 AM  Result Value Ref Range Status   Specimen Description BLOOD LEFT ANTECUBITAL  Final   Special Requests   Final    BOTTLES DRAWN AEROBIC AND ANAEROBIC Blood Culture adequate volume   Culture NO GROWTH 4 DAYS  Final   Report Status PENDING  Incomplete  MRSA PCR Screening     Status: None   Collection Time: 02/06/17  4:30 PM  Result Value Ref Range Status   MRSA by PCR NEGATIVE NEGATIVE Final    Comment:        The GeneXpert MRSA Assay (FDA approved for NASAL specimens only), is one component of a comprehensive MRSA colonization surveillance program. It is not intended to diagnose MRSA infection nor to guide or monitor treatment for MRSA infections.      Labs: BNP (last 3 results) No results for input(s): BNP in the last 8760 hours. Basic Metabolic Panel: Recent Labs  Lab 02/06/17 0800  02/08/17 0227 02/08/17 0800 02/08/17 1444 02/09/17 0258 02/09/17 1313 02/10/17 0737  NA 133*   < > 134*  --  134* 134* 133* 133*  K 3.5   < > 2.7*  --  3.9 4.1 3.5 3.6  CL 80*   < > 101  --  103 106 103 101  CO2 22   < > 25  --  '24 23 23 23  '$ GLUCOSE 372*   < > 111*  --  231* 242* 159* 287*  BUN 12   < > 13  --  '12 10 9 8  '$ CREATININE 0.75   < > 0.61  --  0.68 0.67 0.63 0.62  CALCIUM 9.4   < > 8.7*  --  8.8* 8.7* 8.8* 8.6*  MG 2.2  --   --  1.9  --   --   --   --    < > = values in this interval not displayed.   Liver Function Tests: Recent Labs  Lab 02/07/17 0247  02/08/17 0227 02/09/17 0258 02/09/17 1313 02/10/17 0737  AST 234* 115* 68* 83* 63*  ALT 144* 104* 84* 92* 81*  ALKPHOS 695* 565* 474* 491* 440*  BILITOT 2.7* 1.5*  0.9 0.9 0.9  PROT 6.4* 6.0* 5.8* 6.2* 6.2*  ALBUMIN 3.8 3.5 3.2* 3.5 3.5   Recent Labs  Lab 02/06/17 0800  LIPASE 95*   No results for input(s): AMMONIA in the last 168 hours. CBC: Recent Labs  Lab 02/06/17 0800 02/07/17 0247 02/08/17 0227 02/09/17 0258 02/10/17 0737  WBC 8.2 6.4 5.2 4.8 4.4  NEUTROABS 6.2 4.9 3.4 3.1 2.7  HGB 14.5 11.5* 11.1* 11.2* 11.6*  HCT 41.5 32.5* 32.6* 32.2* 33.5*  MCV 84.0 84.9 85.6 85.9 85.5  PLT 171 117* 119* 129* 176   Cardiac Enzymes: Recent Labs  Lab 02/06/17 0800 02/06/17 1310  TROPONINI 0.03* 0.03*   BNP: Invalid input(s): POCBNP CBG: Recent Labs  Lab 02/09/17 1222 02/09/17 1625 02/09/17 2156 02/10/17 0735 02/10/17 1223  GLUCAP 161* 142* 266* 267* 89   D-Dimer No results for input(s): DDIMER in the last 72 hours. Hgb A1c No results for input(s): HGBA1C in the last 72 hours. Lipid Profile No results for input(s): CHOL, HDL, LDLCALC, TRIG, CHOLHDL, LDLDIRECT in the last 72 hours. Thyroid function studies No results for input(s): TSH, T4TOTAL, T3FREE, THYROIDAB in the last 72 hours.  Invalid input(s): FREET3 Anemia work up No results for input(s): VITAMINB12, FOLATE, FERRITIN, TIBC, IRON, RETICCTPCT in the last 72 hours. Urinalysis    Component Value Date/Time   COLORURINE YELLOW 02/09/2017 1810   APPEARANCEUR CLEAR 02/09/2017 1810   LABSPEC 1.008 02/09/2017 1810   PHURINE 7.0 02/09/2017 1810   GLUCOSEU 50 (A) 02/09/2017 1810   HGBUR SMALL (A) 02/09/2017 1810   BILIRUBINUR NEGATIVE 02/09/2017 1810   KETONESUR 5 (A) 02/09/2017 1810   PROTEINUR NEGATIVE 02/09/2017 1810   UROBILINOGEN 1.0 06/06/2010 0949   NITRITE NEGATIVE 02/09/2017 1810   LEUKOCYTESUR NEGATIVE 02/09/2017 1810   Sepsis Labs Invalid input(s): PROCALCITONIN,  WBC,   LACTICIDVEN Microbiology Recent Results (from the past 240 hour(s))  Blood Culture (routine x 2)     Status: None (Preliminary result)   Collection Time: 02/06/17 10:00 AM  Result Value Ref Range Status   Specimen Description BLOOD LEFT ANTECUBITAL  Final   Special Requests   Final    BOTTLES DRAWN AEROBIC AND ANAEROBIC Blood Culture adequate volume   Culture NO GROWTH 4 DAYS  Final   Report Status PENDING  Incomplete  Blood Culture (routine x 2)     Status: None (Preliminary result)   Collection Time: 02/06/17 11:31 AM  Result Value Ref Range Status   Specimen Description BLOOD LEFT ANTECUBITAL  Final   Special Requests   Final    BOTTLES DRAWN AEROBIC AND ANAEROBIC Blood Culture adequate volume   Culture NO GROWTH 4 DAYS  Final   Report Status PENDING  Incomplete  MRSA PCR Screening     Status: None   Collection Time: 02/06/17  4:30 PM  Result Value Ref Range Status   MRSA by PCR NEGATIVE NEGATIVE Final    Comment:        The GeneXpert MRSA Assay (FDA approved for NASAL specimens only), is one component of a comprehensive MRSA colonization surveillance program. It is not intended to diagnose MRSA infection nor to guide or monitor treatment for MRSA infections.      Time coordinating discharge: Over 30 minutes  SIGNED:   Debbe Odea, MD  [facility] 02/10/2017, 1:43 PM Pager   If 7PM-7AM, please contact night-coverage [email or web address] Password [email or web address]

## 2017-02-10 NOTE — Progress Notes (Signed)
Discharge order in, per provider discharge patient once care management has seen patient.

## 2017-02-10 NOTE — Progress Notes (Signed)
Mauri Readingimothy J Araiza to be D/C'd Home per MD order.  Discussed with the patient and all questions fully answered.  VSS, Skin clean, dry and intact without evidence of skin break down, no evidence of skin tears noted. IV catheter discontinued intact. Site without signs and symptoms of complications. Dressing and pressure applied.  An After Visit Summary was printed and given to the patient. Patient received prescription.  D/c education completed with patient/family including follow up instructions, medication list, d/c activities limitations if indicated, with other d/c instructions as indicated by MD - patient able to verbalize understanding, all questions fully answered.   Patient instructed to return to ED, call 911, or call MD for any changes in condition.   Patient escorted via WC, and D/C home via private auto.  Casper HarrisonSamantha K Norina Cowper 02/10/2017 4:06 PM

## 2017-02-10 NOTE — Discharge Instructions (Signed)
YOU MUST NOT DRINK ALCOHOL WHILE YOU TAKE METFORMIN. IF YOU START DRINKING AGAIN, PLEASE STOP METFORMIN.  THERE IS A GOOD CHANCE THAT YOUR SUGARS WILL NO BE CONTROLLED AND THAT YOU WILL NEED TO GO ON INSULIN.  PLEASE CHECK YOUR SUGARS AT LEAST 2 X DAY: PRIOR TO BREAKFAST AND PRIOR TO DINNER AND KEEP A LOG OF THESE SUGARS. TAKE THIS LOG TO YOUR PCP AT EVERY VISIT.   Please take all your medications with you for your next visit with your Primary MD. Please request your Primary MD to go over all hospital test results at the follow up. Please ask your Primary MD to get all Hospital records sent to his/her office.  If you experience worsening of your admission symptoms, develop shortness of breath, chest pain, suicidal or homicidal thoughts or a life threatening emergency, you must seek medical attention immediately by calling 911 or calling your MD.  Bonita QuinYou must read the complete instructions/literature along with all the possible adverse reactions/side effects for all the medicines you take including new medications that have been prescribed to you. Take new medicines after you have completely understood and accpet all the possible adverse reactions/side effects.   Do not drive when taking pain medications or sedatives.    Do not take more than prescribed Pain, Sleep and Anxiety Medications  If you have smoked or chewed Tobacco in the last 2 yrs please stop. Stop any regular alcohol and or recreational drug use.  Wear Seat belts while driving.

## 2017-02-10 NOTE — Progress Notes (Addendum)
Inpatient Diabetes Program Recommendations  AACE/ADA: New Consensus Statement on Inpatient Glycemic Control (2015)  Target Ranges:  Prepandial:   less than 140 mg/dL      Peak postprandial:   less than 180 mg/dL (1-2 hours)      Critically ill patients:  140 - 180 mg/dL  Results for Shawn Watkins, Shawn Watkins (MRN [id number]) as of 02/10/2017 09:29  Ref. Range 02/09/2017 09:26 02/09/2017 12:22 02/09/2017 16:25 02/09/2017 21:56 02/10/2017 07:35  Glucose-Capillary Latest Ref Range: 65 - 99 mg/dL 409173 (H) 811161 (H) 914142 (H) 266 (H) 267 (H)   Results for Shawn Watkins, Shawn Watkins (MRN [id number]) as of 02/10/2017 09:29  Ref. Range 07/21/2016 04:41 02/06/2017 13:10  Hemoglobin A1C Latest Ref Range: 4.8 - 5.6 % 8.4 (H) 9.8 (H)   Review of Glycemic Control  Diabetes history: DM2 (dx in June 2018 with DM from pancreatitis while inpatient at [facility]) Outpatient Diabetes medications: None Current orders for Inpatient glycemic control: Amaryl 6 mg QAM, Metformin 500 mg BID, Novolog 0-15 units TID with meals  Inpatient Diabetes Program Recommendations:  Correction (SSI): Bedtime glucose 266 mg/dl on 09/6510/26 and fasting glucose 267 mg/dl this morning. No insulin given at bedtime last night since none ordered. Please consider ordering Novolog 0-5 units QHS for bedtime correction. HgbA1C: A1C 9.8% on 02/06/17 indicating an average glucose of 235 mg/dl over the past 2-3 months. Oral DM medications: Would not recommend using Metformin due to high risk of lactic acidosis with alcohol use.  Thanks, Orlando PennerMarie Beth Goodlin, RN, MSN, CDE Diabetes Coordinator Inpatient Diabetes Program [phone] (Team Pager from 8am to 5pm)

## 2017-02-10 NOTE — Care Management Note (Addendum)
Case Management Note  Patient Details  Name: Shawn Watkins MRN: [id number] Date of Birth: 12/11/1980  Subjective/Objective:           Admitted with acute pancreatitis.         PCP:  [facility]  Action/Plan:  CM: received consult: PATIENT HAS NO INSURANCE AND NEEDS MEDICATIONS AND F/U WITH A PCP. HE IS BEING DISCHARGED TODAY.  Plan is to Transition to home today. Rx meds and Match Letter faxed to [facility] pharmacy for pt pick to assist with medication needs. Pt with transportation to home.  Post hospital follow up scheduled with Julianne HandlerLaChina Hollis NP on 02/18/2016 at 9:30am [facility].  Expected Discharge Date:  02/10/17               Expected Discharge Plan:  Home/Self Care  In-House Referral:     Discharge planning Services  CM Consult, MATCH Program, Indigent Health Clinic  Post Acute Care Choice:    Choice offered to:     DME Arranged:    DME Agency:     HH Arranged:    HH Agency:     Status of Service:  Completed, signed off  If discussed at [employer] Meetings, dates discussed:    Additional Comments:  Epifanio LeschesCole, Garrett Mitchum Hudson, RN 02/10/2017, 12:29 PM

## 2017-02-11 LAB — CULTURE, BLOOD (ROUTINE X 2)
Culture: NO GROWTH
Culture: NO GROWTH
Special Requests: ADEQUATE
Special Requests: ADEQUATE

## 2017-02-17 ENCOUNTER — Ambulatory Visit: Payer: Self-pay | Admitting: Family Medicine

## 2017-02-18 ENCOUNTER — Other Ambulatory Visit: Payer: Self-pay

## 2017-02-18 ENCOUNTER — Inpatient Hospital Stay (HOSPITAL_COMMUNITY)
Admission: EM | Admit: 2017-02-18 | Discharge: 2017-02-19 | DRG: 897 | Disposition: A | Discharge status: Home / Self Care | Payer: Self-pay | Attending: Family Medicine | Admitting: Family Medicine

## 2017-02-18 ENCOUNTER — Encounter (HOSPITAL_COMMUNITY): Payer: Self-pay | Admitting: *Deleted

## 2017-02-18 DIAGNOSIS — F17210 Nicotine dependence, cigarettes, uncomplicated: Secondary | ICD-10-CM | POA: Diagnosis present

## 2017-02-18 DIAGNOSIS — F1010 Alcohol abuse, uncomplicated: Secondary | ICD-10-CM

## 2017-02-18 DIAGNOSIS — E1165 Type 2 diabetes mellitus with hyperglycemia: Secondary | ICD-10-CM | POA: Diagnosis present

## 2017-02-18 DIAGNOSIS — F10929 Alcohol use, unspecified with intoxication, unspecified: Secondary | ICD-10-CM | POA: Diagnosis present

## 2017-02-18 DIAGNOSIS — E860 Dehydration: Secondary | ICD-10-CM | POA: Diagnosis present

## 2017-02-18 DIAGNOSIS — F10229 Alcohol dependence with intoxication, unspecified: Secondary | ICD-10-CM | POA: Diagnosis present

## 2017-02-18 DIAGNOSIS — R739 Hyperglycemia, unspecified: Secondary | ICD-10-CM

## 2017-02-18 DIAGNOSIS — F10239 Alcohol dependence with withdrawal, unspecified: Principal | ICD-10-CM | POA: Diagnosis present

## 2017-02-18 DIAGNOSIS — Z720 Tobacco use: Secondary | ICD-10-CM

## 2017-02-18 DIAGNOSIS — F10939 Alcohol use, unspecified with withdrawal, unspecified: Secondary | ICD-10-CM

## 2017-02-18 DIAGNOSIS — Z9114 Patient's other noncompliance with medication regimen: Secondary | ICD-10-CM

## 2017-02-18 LAB — CBC
HEMATOCRIT: 42.4 % (ref 39.0–52.0)
HEMOGLOBIN: 14.7 g/dL (ref 13.0–17.0)
MCH: 29.7 pg (ref 26.0–34.0)
MCHC: 34.7 g/dL (ref 30.0–36.0)
MCV: 85.7 fL (ref 78.0–100.0)
Platelets: 726 10*3/uL — ABNORMAL HIGH (ref 150–400)
RBC: 4.95 MIL/uL (ref 4.22–5.81)
RDW: 13.3 % (ref 11.5–15.5)
WBC: 4.8 10*3/uL (ref 4.0–10.5)

## 2017-02-18 LAB — RAPID URINE DRUG SCREEN, HOSP PERFORMED
AMPHETAMINES: NOT DETECTED
Barbiturates: NOT DETECTED
Benzodiazepines: NOT DETECTED
COCAINE: NOT DETECTED
Opiates: NOT DETECTED
TETRAHYDROCANNABINOL: NOT DETECTED

## 2017-02-18 LAB — COMPREHENSIVE METABOLIC PANEL
ALT: 72 U/L — ABNORMAL HIGH (ref 17–63)
AST: 56 U/L — AB (ref 15–41)
Albumin: 4.8 g/dL (ref 3.5–5.0)
Alkaline Phosphatase: 340 U/L — ABNORMAL HIGH (ref 38–126)
Anion gap: 20 — ABNORMAL HIGH (ref 5–15)
BUN: 5 mg/dL — AB (ref 6–20)
CHLORIDE: 94 mmol/L — AB (ref 101–111)
CO2: 27 mmol/L (ref 22–32)
Calcium: 9.3 mg/dL (ref 8.9–10.3)
Creatinine, Ser: 0.72 mg/dL (ref 0.61–1.24)
GFR calc Af Amer: >60 mL/min (ref >60)
GFR calc non Af Amer: >60 mL/min (ref >60)
GLUCOSE: 282 mg/dL — AB (ref 65–99)
POTASSIUM: 3.8 mmol/L (ref 3.5–5.1)
Sodium: 141 mmol/L (ref 135–145)
Total Bilirubin: 0.5 mg/dL (ref 0.3–1.2)
Total Protein: 8.3 g/dL — ABNORMAL HIGH (ref 6.5–8.1)

## 2017-02-18 LAB — CBG MONITORING, ED
Glucose-Capillary: 211 mg/dL — ABNORMAL HIGH (ref 65–99)
Glucose-Capillary: 349 mg/dL — ABNORMAL HIGH (ref 65–99)
Glucose-Capillary: 240 mg/dL — ABNORMAL HIGH (ref 65–99)

## 2017-02-18 LAB — ETHANOL: ALCOHOL ETHYL (B): 364 mg/dL — AB (ref <10)

## 2017-02-18 MED ORDER — ONDANSETRON HCL 4 MG/2ML IJ SOLN
4.0000 mg | Freq: Once | INTRAMUSCULAR | Status: AC
  Administered 2017-02-18: 4 mg via INTRAVENOUS
  Filled 2017-02-18: qty 2

## 2017-02-18 MED ORDER — LORAZEPAM 2 MG/ML IJ SOLN: 2.0000 mg | Freq: Four times a day (QID) | INTRAMUSCULAR | Status: DC | PRN

## 2017-02-18 MED ORDER — LORAZEPAM 2 MG/ML IJ SOLN
1.0000 mg | Freq: Once | INTRAMUSCULAR | Status: AC
Start: 2017-02-18 — End: 2017-02-18
  Administered 2017-02-18: 1 mg via INTRAVENOUS
  Filled 2017-02-18: qty 1

## 2017-02-18 MED ORDER — PANTOPRAZOLE SODIUM 40 MG PO TBEC
40.0000 mg | DELAYED_RELEASE_TABLET | Freq: Every day | ORAL | Status: DC
  Administered 2017-02-18 – 2017-02-19 (×2): 40 mg via ORAL
  Filled 2017-02-18 (×2): qty 1

## 2017-02-18 MED ORDER — KETOROLAC TROMETHAMINE 30 MG/ML IJ SOLN
30.0000 mg | Freq: Four times a day (QID) | INTRAMUSCULAR | Status: DC | PRN
Start: 2017-02-18 — End: 2017-02-19
  Administered 2017-02-18 – 2017-02-19 (×3): 30 mg via INTRAVENOUS
  Filled 2017-02-18 (×3): qty 1

## 2017-02-18 MED ORDER — SENNOSIDES-DOCUSATE SODIUM 8.6-50 MG PO TABS
1.0000 | ORAL_TABLET | Freq: Every evening | ORAL | Status: DC | PRN
  Filled 2017-02-18: qty 1

## 2017-02-18 MED ORDER — ONDANSETRON HCL 4 MG/2ML IJ SOLN
4.0000 mg | Freq: Four times a day (QID) | INTRAMUSCULAR | Status: DC | PRN
  Administered 2017-02-18: 4 mg via INTRAVENOUS
  Filled 2017-02-18: qty 2

## 2017-02-18 MED ORDER — ONDANSETRON HCL 4 MG PO TABS: 4.0000 mg | ORAL_TABLET | Freq: Four times a day (QID) | ORAL | Status: DC | PRN

## 2017-02-18 MED ORDER — NICOTINE 21 MG/24HR TD PT24
21.0000 mg | MEDICATED_PATCH | Freq: Every day | TRANSDERMAL | Status: DC
  Administered 2017-02-18 – 2017-02-19 (×3): 21 mg via TRANSDERMAL
  Filled 2017-02-18: qty 1

## 2017-02-18 MED ORDER — SODIUM CHLORIDE 0.9 % IV BOLUS (SEPSIS)
1000.0000 mL | Freq: Once | INTRAVENOUS | Status: AC
  Administered 2017-02-18: 1000 mL via INTRAVENOUS

## 2017-02-18 MED ORDER — LORAZEPAM 2 MG/ML IJ SOLN
0.0000 mg | INTRAMUSCULAR | Status: DC
  Administered 2017-02-18 – 2017-02-19 (×6): 2 mg via INTRAVENOUS
  Filled 2017-02-18 (×6): qty 1

## 2017-02-18 MED ORDER — THIAMINE HCL 100 MG/ML IJ SOLN: 100.0000 mg | Freq: Every day | INTRAMUSCULAR | Status: DC

## 2017-02-18 MED ORDER — LORAZEPAM 2 MG/ML IJ SOLN: 0.0000 mg | Freq: Two times a day (BID) | INTRAMUSCULAR | Status: DC

## 2017-02-18 MED ORDER — SODIUM CHLORIDE 0.9 % IV SOLN
INTRAVENOUS | Status: DC
  Administered 2017-02-18 (×2): via INTRAVENOUS

## 2017-02-18 MED ORDER — VITAMIN B-1 100 MG PO TABS
100.0000 mg | ORAL_TABLET | Freq: Every day | ORAL | Status: DC
  Administered 2017-02-18 – 2017-02-19 (×2): 100 mg via ORAL
  Filled 2017-02-18 (×2): qty 1

## 2017-02-18 MED ORDER — NICOTINE 21 MG/24HR TD PT24
MEDICATED_PATCH | TRANSDERMAL | Status: AC
  Administered 2017-02-18: 21 mg via TRANSDERMAL
  Filled 2017-02-18: qty 1

## 2017-02-18 MED ORDER — LORAZEPAM 2 MG/ML IJ SOLN: 0.0000 mg | Freq: Three times a day (TID) | INTRAMUSCULAR | Status: DC

## 2017-02-18 MED ORDER — FOLIC ACID 1 MG PO TABS
1.0000 mg | ORAL_TABLET | Freq: Every day | ORAL | Status: DC
  Administered 2017-02-18 – 2017-02-19 (×2): 1 mg via ORAL
  Filled 2017-02-18 (×2): qty 1

## 2017-02-18 MED ORDER — INSULIN ASPART 100 UNIT/ML ~~LOC~~ SOLN
0.0000 [IU] | Freq: Every day | SUBCUTANEOUS | Status: DC
  Administered 2017-02-18: 2 [IU] via SUBCUTANEOUS
  Filled 2017-02-18: qty 1

## 2017-02-18 MED ORDER — LORAZEPAM 2 MG/ML IJ SOLN
2.0000 mg | Freq: Once | INTRAMUSCULAR | Status: AC
  Administered 2017-02-18: 2 mg via INTRAVENOUS
  Filled 2017-02-18: qty 1

## 2017-02-18 MED ORDER — ONDANSETRON 8 MG PO TBDP: 8.0000 mg | ORAL_TABLET | Freq: Once | ORAL | Status: DC

## 2017-02-18 MED ORDER — ENOXAPARIN SODIUM 40 MG/0.4ML ~~LOC~~ SOLN
40.0000 mg | Freq: Every day | SUBCUTANEOUS | Status: DC
  Administered 2017-02-18: 40 mg via SUBCUTANEOUS
  Filled 2017-02-18: qty 0.4

## 2017-02-18 MED ORDER — LORAZEPAM 1 MG PO TABS
2.0000 mg | ORAL_TABLET | Freq: Four times a day (QID) | ORAL | Status: DC | PRN
  Administered 2017-02-18: 2 mg via ORAL
  Filled 2017-02-18: qty 2

## 2017-02-18 MED ORDER — LORAZEPAM 1 MG PO TABS: 1.0000 mg | ORAL_TABLET | Freq: Once | ORAL | Status: DC

## 2017-02-18 MED ORDER — INSULIN ASPART 100 UNIT/ML ~~LOC~~ SOLN
0.0000 [IU] | Freq: Three times a day (TID) | SUBCUTANEOUS | Status: DC
  Administered 2017-02-18: 11 [IU] via SUBCUTANEOUS
  Administered 2017-02-19 (×2): 5 [IU] via SUBCUTANEOUS
  Filled 2017-02-18: qty 1

## 2017-02-18 MED ORDER — LORAZEPAM 2 MG/ML IJ SOLN: 0.0000 mg | Freq: Four times a day (QID) | INTRAMUSCULAR | Status: DC

## 2017-02-18 MED ORDER — ADULT MULTIVITAMIN W/MINERALS CH
1.0000 | ORAL_TABLET | Freq: Every day | ORAL | Status: DC
  Administered 2017-02-18 – 2017-02-19 (×2): 1 via ORAL
  Filled 2017-02-18 (×2): qty 1

## 2017-02-18 NOTE — ED Notes (Signed)
Dr Johnson in room to see pt.

## 2017-02-18 NOTE — ED Triage Notes (Signed)
Pt mother reports "excessive drinking" for 6 days. Pt reports last ETOH an hour ago. Pt says he wants help with his ETOH. Pt denies SI or HI. Pt reports that he is visual hallucinations.

## 2017-02-18 NOTE — H&P (Addendum)
History and Physical  Shawn Watkins UJW:[id number] DOB: [birth year] DOA: 02/18/2017  Referring physician: Rubin PayorPickering MD PCP: Patient, No Pcp Per   Chief Complaint: alcohol withdrawal symptoms  HPI: Shawn Watkins is a 37 y.o. male known chronic alcohol abuser who was recently [facility] for an admission related to chronic alcoholism.  Apparently the patient went home and drank excessively over the past 6 days and now has presented to the emergency department with alcohol intoxication and acute withdrawal symptoms.  He complains of nausea.  He complains of feeling aches and pains and shakes.  He does have a history of severe alcohol withdrawal.  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  He had received counseling and encouragement to stop drinking during last hospitalization.  The patient was also scheduled with a primary care provider but failed to go to the appointment which was scheduled for yesterday.  ED course.  The patient was given some IV fluids and IV Lorazepam but continued to have withdrawal symptoms and his CIWA score has increased.  Hospital admission was requested for treatment of acute alcohol withdrawal.  Review of Systems: All systems reviewed and apart from history of presenting illness, are negative.  Past Medical History:  Diagnosis Date  . Alcoholic hepatitis ~ 2011, 07/2016, 01/2017   course of prednisolone in 07/2016  . Delirium tremens (HCC) 07/2016   also hx ETOH withdrawal seizures prior to 2018  . Depression   . ETOH abuse 07/17/2016  . Pancreatitis 2006, 2017, 07/2016   due to ETOH  . Tobacco abuse 07/17/2016  . Transaminitis 07/17/2016  . Type 2 diabetes mellitus (HCC) 02/06/2017   History reviewed. No pertinent surgical history. Social History:  reports that he has been smoking cigarettes.  He started smoking about 10 years ago. He has been smoking about 1.00 pack per day. he has never used smokeless tobacco. He reports that he drinks about  60.0 oz of alcohol per week. He reports that he does not use drugs.  No Known Allergies  No family history on file.  Prior to Admission medications   Medication Sig Start Date End Date Taking? Authorizing Provider  ibuprofen (ADVIL,MOTRIN) 200 MG tablet Take 400-600 mg by mouth every 6 (six) hours as needed for moderate pain.   Yes [provider]  Multiple Vitamins-Minerals (MULTIVITAMIN) tablet Take 1 tablet by mouth daily. 02/10/17  Yes Calvert Cantorizwan, Saima, MD   Physical Exam: Vitals:   02/18/17 1100 02/18/17 1130 02/18/17 1145 02/18/17 1200  BP: 133/85 122/80  122/80  Pulse: 91 98 (!) 103 100  Resp:  18 15 18   Temp:      TempSrc:      SpO2: 96% 92% 96% 100%  Weight:      Height:         General exam: Moderately built and nourished patient, lying comfortably supine on the gurney in no obvious distress.  Head, eyes and ENT: Nontraumatic and normocephalic. Pupils equally reacting to light and accommodation. Oral mucosa dry.  Neck: Supple. No JVD, carotid bruit or thyromegaly.  Lymphatics: No lymphadenopathy.  Respiratory system: Clear to auscultation. No increased work of breathing.  Cardiovascular system: S1 and S2 heard, tachycardic. No JVD, murmurs, gallops, clicks or pedal edema.  Gastrointestinal system: Abdomen is nondistended, soft and nontender. Normal bowel sounds heard. No organomegaly or masses appreciated.  Central nervous system: Alert and oriented. No focal neurological deficits.  Resting tremors noted.  Extremities: Symmetric 5 x 5 power.  Peripheral pulses symmetrically felt.   Skin: Clammy.  No rashes or acute findings.  Musculoskeletal system: Negative exam.  Psychiatry: Pleasant and cooperative.  Labs on Admission:  Basic Metabolic Panel: Recent Labs  Lab 02/18/17 0216  NA 141  K 3.8  CL 94*  CO2 27  GLUCOSE 282*  BUN 5*  CREATININE 0.72  CALCIUM 9.3   Liver Function Tests: Recent Labs  Lab 02/18/17 0216  AST 56*  ALT 72*    ALKPHOS 340*  BILITOT 0.5  PROT 8.3*  ALBUMIN 4.8   No results for input(s): LIPASE, AMYLASE in the last 168 hours. No results for input(s): AMMONIA in the last 168 hours. CBC: Recent Labs  Lab 02/18/17 0216  WBC 4.8  HGB 14.7  HCT 42.4  MCV 85.7  PLT 726*   Cardiac Enzymes: No results for input(s): CKTOTAL, CKMB, CKMBINDEX, TROPONINI in the last 168 hours.  BNP (last 3 results) No results for input(s): PROBNP in the last 8760 hours. CBG: Recent Labs  Lab 02/18/17 0745  GLUCAP 211*    Radiological Exams on Admission: No results found.  EKG: Independently reviewed. Tachycardia.   Assessment/Plan Principal Problem:   Alcohol intoxication (HCC) Active Problems:   ETOH abuse   Alcohol abuse   Alcohol withdrawal (HCC)   1. Acute alcohol withdrawal -unfortunately the patient continued to binge on alcohol when he was discharged from last hospitalization less than 1 week ago.  Will admit for IV fluids and IV lorazepam.  Vitamin supplements ordered with thiamine multivitamins and folic acid.  Will ask for social worker to provide patient with treatment options.  He is admitted to SDU because of his history of severe delirium tremens.  2. Tobacco - will offer nicotine patch for cravings.  DVT Prophylaxis: lovenox Code Status: Full   Family Communication: none present  Disposition Plan: TBD   Time spent: 55 mins  Standley Dakins, MD [facility] Pager [phone]  If 7PM-7AM, please contact night-coverage [email or web address] Password [email or web address] 02/18/2017, 1:01 PM

## 2017-02-18 NOTE — ED Provider Notes (Signed)
  Physical Exam  BP 122/87   Pulse 93   Temp 98.3 F (36.8 C) (Oral)   Resp 18   Ht 6\' 1"  (1.854 m)   Wt 83.9 kg (185 lb)   SpO2 95%   BMI 24.41 kg/m   Physical Exam  ED Course/Procedures     Procedures  MDM  Patient received in signout from Dr. Bebe ShaggyWickline last night.  History of alcohol abuse.  Continues to be tremulous.  No real relief with IV fluids and Ativan.  See was score has increased.  Has had previous withdrawal seizures in the past.  No hallucinations but has some tachycardia and tremors.  Will admit to hospitalist.       Benjiman CorePickering, Kyree Fedorko, MD 02/18/17 1101

## 2017-02-18 NOTE — ED Provider Notes (Signed)
[facility] EMERGENCY DEPARTMENT Provider Note   CSN: [id number] Arrival date & time: 02/18/17  0110     History   Chief Complaint Chief Complaint  Patient presents with  . Alcohol Problem    HPI Shawn Watkins is a 37 y.o. male.  The history is provided by the patient.  Alcohol Problem  This is a chronic problem. The current episode started more than 1 week ago. The problem occurs daily. The problem has not changed since onset.Pertinent negatives include no chest pain. Nothing aggravates the symptoms. Nothing relieves the symptoms.  Patient presents with continued alcohol abuse He has a long history of alcohol abuse, and has frequent ER visits for alcohol abuse. Reports he needs help with his alcohol. He reports he feels he is withdrawing.  His last drink was 1 hour prior to arrival. He denies any suicidal ideation, but does feel that he is hallucinating and feels very anxious He also reports nausea and vomiting, he reports tremors, and reports diffuse myalgias  Past Medical History:  Diagnosis Date  . Alcoholic hepatitis ~ 5784, 07/2016, 01/2017   course of prednisolone in 07/2016  . Delirium tremens ([place]) 07/2016   also hx ETOH withdrawal seizures prior to 2018  . Depression   . ETOH abuse 07/17/2016  . Pancreatitis 2006, 2017, 07/2016   due to ETOH  . Tobacco abuse 07/17/2016  . Transaminitis 07/17/2016  . Type 2 diabetes mellitus ([place]) 02/06/2017    Patient Active Problem List   Diagnosis Date Noted  . Alcohol induced acute pancreatitis 02/10/2017  . Hypokalemia 02/10/2017  . Lactic acidosis 02/10/2017  . Alcohol abuse 02/06/2017  . Severe dehydration 02/06/2017  . Fever and chills 02/06/2017  . Type 2 diabetes mellitus ([place]) 02/06/2017  . Acute alcoholic hepatitis 69/62/9528  . Alcoholic pancreatitis 41/32/4401  . Acute gallstone pancreatitis 07/17/2016  . Transaminitis 07/17/2016  . Leukocytosis 07/17/2016  . Hyponatremia 07/17/2016  . ETOH abuse 07/17/2016    . Tobacco abuse 07/17/2016    History reviewed. No pertinent surgical history.     Home Medications    Prior to Admission medications   Medication Sig Start Date End Date Taking? Authorizing Provider  glimepiride (AMARYL) 4 MG tablet Take 1.5 tablets (6 mg total) by mouth daily with breakfast. 02/11/17   Debbe Odea, MD  glucose monitoring kit (FREESTYLE) monitoring kit 1 each by Does not apply route 4 (four) times daily - after meals and at bedtime. 1 month Diabetic Testing Supplies for QAC-QHS accuchecks. 02/10/17   Debbe Odea, MD  ibuprofen (ADVIL,MOTRIN) 200 MG tablet Take 400-600 mg by mouth every 6 (six) hours as needed for moderate pain.    [provider]  lipase/protease/amylase (CREON) 36000 UNITS CPEP capsule Take 1 capsule (36,000 Units total) by mouth 3 (three) times daily before meals. 02/10/17   Debbe Odea, MD  metFORMIN (GLUCOPHAGE) 500 MG tablet Take 1 tablet (500 mg total) by mouth 2 (two) times daily with a meal. 02/10/17   Debbe Odea, MD  Multiple Vitamins-Minerals (MULTIVITAMIN) tablet Take 1 tablet by mouth daily. 02/10/17   Debbe Odea, MD    Family History No family history on file.  Social History Social History   Tobacco Use  . Smoking status: Current Every Day Smoker    Packs/day: 1.00    Types: Cigarettes    Start date: 08/04/2006  . Smokeless tobacco: Never Used  Substance Use Topics  . Alcohol use: Yes    Alcohol/week: 60.0 oz  Types: 100 Cans of beer per week    Comment: daily  . Drug use: No     Allergies   Patient has no known allergies.   Review of Systems Review of Systems  Constitutional: Positive for fatigue. Negative for fever.  Cardiovascular: Negative for chest pain.  Gastrointestinal: Positive for nausea and vomiting.  Neurological: Positive for tremors.  All other systems reviewed and are negative.    Physical Exam Updated Vital Signs BP 127/89   Pulse 94   Temp 98.4 F (36.9 C) (Oral)    Resp 16   Ht 1.854 m ('6\' 1"'$ )   Wt 83.9 kg (185 lb)   SpO2 96%   BMI 24.41 kg/m   Physical Exam CONSTITUTIONAL: Disheveled, anxious, does not smell ketotic HEAD: Normocephalic/atraumatic EYES: EOMI/PERRL ENMT: Mucous membranes dry NECK: supple no meningeal signs SPINE/BACK:entire spine nontender CV: S1/S2 noted, no murmurs/rubs/gallops noted LUNGS: Lungs are clear to auscultation bilaterally, no apparent distress ABDOMEN: soft, nontender, no rebound or guarding, bowel sounds noted throughout abdomen GU:no cva tenderness NEURO: Pt is awake/alert/appropriate, moves all extremitiesx4.  No facial droop.  Mild tremor noted that improved with rest.  He is ambulatory EXTREMITIES: pulses normal/equal, full ROM SKIN: warm, color normal PSYCH: Anxious  ED Treatments / Results  Labs (all labs ordered are listed, but only abnormal results are displayed) Labs Reviewed  COMPREHENSIVE METABOLIC PANEL - Abnormal; Notable for the following components:      Result Value   Chloride 94 (*)    Glucose, Bld 282 (*)    BUN 5 (*)    Total Protein 8.3 (*)    AST 56 (*)    ALT 72 (*)    Alkaline Phosphatase 340 (*)    Anion gap 20 (*)    All other components within normal limits  ETHANOL - Abnormal; Notable for the following components:   Alcohol, Ethyl (B) 364 (*)    All other components within normal limits  CBC - Abnormal; Notable for the following components:   Platelets 726 (*)    All other components within normal limits  RAPID URINE DRUG SCREEN, HOSP PERFORMED    EKG  EKG Interpretation None       Radiology No results found.  Procedures Procedures   Medications Ordered in ED Medications  sodium chloride 0.9 % bolus 1,000 mL (1,000 mLs Intravenous New Bag/Given 02/18/17 0637)  ondansetron (ZOFRAN) injection 4 mg (4 mg Intravenous Given 02/18/17 2376)  LORazepam (ATIVAN) injection 1 mg (1 mg Intravenous Given 02/18/17 0633)  sodium chloride 0.9 % bolus 1,000 mL (1,000 mLs  Intravenous New Bag/Given 02/18/17 2831)     Initial Impression / Assessment and Plan / ED Course  I have reviewed the triage vital signs and the nursing notes.  Pertinent labs & imaging results that were available during my care of the patient were reviewed by me and considered in my medical decision making (see chart for details).     6:24 AM Patient in the ER for alcohol abuse.  He is clearly intoxicated, but is also dehydrated He has anion gap, but I feel this is likely due to alcohol and dehydration.  He does not appear to be in DKA Initial CIWA score 9 Plan to rehydrate, give Ativan and reassess He reported he was hallucinating, but at this point he does not appear psychotic 7:33 AM Sign out to Dr. Alvino Chapel is to reassess patient after IV fluids. Recheck CIWA score, if that it is improved he is safe  for discharge home Final Clinical Impressions(s) / ED Diagnoses   Final diagnoses:  Alcohol abuse  Dehydration  Hyperglycemia    ED Discharge Orders    None       Ripley Fraise, MD 02/18/17 [id number]

## 2017-02-18 NOTE — ED Notes (Signed)
Pt requesting pain medication, MD notified.

## 2017-02-18 NOTE — ED Notes (Signed)
Date and time results received: 02/18/17 0310 (use smartphrase ".now" to insert current time)  Test: etoh Critical Value: 364  Name of Provider Notified: [place]  Orders Received? Or Actions Taken?:

## 2017-02-19 DIAGNOSIS — F10929 Alcohol use, unspecified with intoxication, unspecified: Secondary | ICD-10-CM

## 2017-02-19 DIAGNOSIS — E118 Type 2 diabetes mellitus with unspecified complications: Secondary | ICD-10-CM

## 2017-02-19 DIAGNOSIS — F1010 Alcohol abuse, uncomplicated: Secondary | ICD-10-CM

## 2017-02-19 DIAGNOSIS — F10239 Alcohol dependence with withdrawal, unspecified: Principal | ICD-10-CM

## 2017-02-19 LAB — GLUCOSE, CAPILLARY
GLUCOSE-CAPILLARY: 201 mg/dL — AB (ref 65–99)
Glucose-Capillary: 208 mg/dL — ABNORMAL HIGH (ref 65–99)

## 2017-02-19 LAB — COMPREHENSIVE METABOLIC PANEL
ALK PHOS: 256 U/L — AB (ref 38–126)
ALT: 48 U/L (ref 17–63)
ANION GAP: 10 (ref 5–15)
AST: 40 U/L (ref 15–41)
Albumin: 3.9 g/dL (ref 3.5–5.0)
BILIRUBIN TOTAL: 0.8 mg/dL (ref 0.3–1.2)
BUN: 11 mg/dL (ref 6–20)
CALCIUM: 9.2 mg/dL (ref 8.9–10.3)
CO2: 29 mmol/L (ref 22–32)
Chloride: 100 mmol/L — ABNORMAL LOW (ref 101–111)
Creatinine, Ser: 0.72 mg/dL (ref 0.61–1.24)
GFR calc non Af Amer: >60 mL/min (ref >60)
Glucose, Bld: 168 mg/dL — ABNORMAL HIGH (ref 65–99)
Potassium: 4.1 mmol/L (ref 3.5–5.1)
Sodium: 139 mmol/L (ref 135–145)
TOTAL PROTEIN: 6.4 g/dL — AB (ref 6.5–8.1)

## 2017-02-19 LAB — MAGNESIUM: MAGNESIUM: 1.8 mg/dL (ref 1.7–2.4)

## 2017-02-19 MED ORDER — FOLIC ACID 1 MG PO TABS: 1.0000 mg | ORAL_TABLET | Freq: Every day | ORAL | 0 refills | Status: AC

## 2017-02-19 MED ORDER — GLIMEPIRIDE 2 MG PO TABS: 2.0000 mg | ORAL_TABLET | Freq: Every day | ORAL | 0 refills | Status: DC

## 2017-02-19 MED ORDER — BLOOD GLUCOSE METER KIT: PACK | 0 refills | Status: DC

## 2017-02-19 NOTE — Discharge Instructions (Signed)
Follow with Primary MD  Patient, No Pcp Per  and other consultant's as instructed your Hospitalist MD  Please get a complete blood count and chemistry panel checked by your Primary MD at your next visit, and again as instructed by your Primary MD.  Get Medicines reviewed and adjusted: Please take all your medications with you for your next visit with your Primary MD  Laboratory/radiological data: Please request your Primary MD to go over all hospital tests and procedure/radiological results at the follow up, please ask your Primary MD to get all Hospital records sent to his/her office.  In some cases, they will be blood work, cultures and biopsy results pending at the time of your discharge. Please request that your primary care M.D. follows up on these results.  Also Note the following: If you experience worsening of your admission symptoms, develop shortness of breath, life threatening emergency, suicidal or homicidal thoughts you must seek medical attention immediately by calling 911 or calling your MD immediately  if symptoms less severe.  You must read complete instructions/literature along with all the possible adverse reactions/side effects for all the Medicines you take and that have been prescribed to you. Take any new Medicines after you have completely understood and accpet all the possible adverse reactions/side effects.   Do not drive when taking Pain medications or sleeping medications (Benzodaizepines)  Do not take more than prescribed Pain, Sleep and Anxiety Medications. It is not advisable to combine anxiety,sleep and pain medications without talking with your primary care practitioner  Special Instructions: If you have smoked or chewed Tobacco  in the last 2 yrs please stop smoking, stop any regular Alcohol  and or any Recreational drug use.  Wear Seat belts while driving.  Please note: You were cared for by a hospitalist during your hospital stay. Once you are discharged,  your primary care physician will handle any further medical issues. Please note that NO REFILLS for any discharge medications will be authorized once you are discharged, as it is imperative that you return to your primary care physician (or establish a relationship with a primary care physician if you do not have one) for your post hospital discharge needs so that they can reassess your need for medications and monitor your lab values.     Alcohol Abuse and Nutrition Alcohol abuse is any pattern of alcohol consumption that harms your health, relationships, or work. Alcohol abuse can affect how your body breaks down and absorbs nutrients from food by causing your liver to work abnormally. Additionally, many people who abuse alcohol do not eat enough carbohydrates, protein, fat, vitamins, and minerals. This can cause poor nutrition (malnutrition) and a lack of nutrients (nutrient deficiencies), which can lead to further complications. Nutrients that are commonly lacking (deficient) among people who abuse alcohol include:  Vitamins. ? Vitamin A. This is stored in your liver. It is important for your vision, metabolism, and ability to fight off infections (immunity). ? B vitamins. These include vitamins such as folate, thiamin, and niacin. These are important in new cell growth and maintenance. ? Vitamin C. This plays an important role in iron absorption, wound healing, and immunity. ? Vitamin D. This is produced by your liver, but you can also get vitamin D from food. Vitamin D is necessary for your body to absorb and use calcium.  Minerals. ? Calcium. This is important for your bones and your heart and blood vessel (cardiovascular) function. ? Iron. This is important for blood, muscle, and nervous  system functioning. ? Magnesium. This plays an important role in muscle and nerve function, and it helps to control blood sugar and blood pressure. ? Zinc. This is important for the normal function of your  nervous system and digestive system (gastrointestinal tract).  Nutrition is an essential component of therapy for alcohol abuse. Your health care provider or dietitian will work with you to design a plan that can help restore nutrients to your body and prevent potential complications. What is my plan? Your dietitian may develop a specific diet plan that is based on your condition and any other complications you may have. A diet plan will commonly include:  A balanced diet. ? Grains: 6-8 oz per day. ? Vegetables: 2-3 cups per day. ? Fruits: 1-2 cups per day. ? Meat and other protein: 5-6 oz per day. ? Dairy: 2-3 cups per day.  Vitamin and mineral supplements.  What do I need to know about alcohol and nutrition?  Consume foods that are high in antioxidants, such as grapes, berries, nuts, green tea, and dark green and orange vegetables. This can help to counteract some of the stress that is placed on your liver by consuming alcohol.  Avoid food and drinks that are high in fat and sugar. Foods such as sugared soft drinks, salty snack foods, and candy contain empty calories. This means that they lack important nutrients such as protein, fiber, and vitamins.  Eat frequent meals and snacks. Try to eat 5-6 small meals each day.  Eat a variety of fresh fruits and vegetables each day. This will help you get plenty of water, fiber, and vitamins in your diet.  Drink plenty of water and other clear fluids. Try to drink at least 48-64 oz (1.5-2 L) of water per day.  If you are a vegetarian, eat a variety of protein-rich foods. Pair whole grains with plant-based proteins at meals and snacks to obtain the greatest nutrient benefit from your food. For example, eat rice with beans, put peanut butter on whole-grain toast, or eat oatmeal with sunflower seeds.  Soak beans and whole grains overnight before cooking. This can help your body to absorb the nutrients more easily.  Include foods fortified with  vitamins and minerals in your diet. Commonly fortified foods include milk, orange juice, cereal, and bread.  If you are malnourished, your dietitian may recommend a high-protein, high-calorie diet. This may include: ? 2,000-3,000 calories (kilocalories) per day. ? 70-100 grams of protein per day.  Your health care provider may recommend a complete nutritional supplement beverage. This can help to restore calories, protein, and vitamins to your body. Depending on your condition, you may be advised to consume this instead of or in addition to meals.  Limit your intake of caffeine. Replace drinks like coffee and black tea with decaffeinated coffee and herbal tea.  Eat a variety of foods that are high in omega fatty acids. These include fish, nuts and seeds, and soybeans. These foods may help your liver to recover and may also stabilize your mood.  Certain medicines may cause changes in your appetite, taste, and weight. Work with your health care provider and dietitian to make any adjustments to your medicines and diet plan.  Include other healthy lifestyle choices in your daily routine. ? Be physically active. ? Get enough sleep. ? Spend time doing activities that you enjoy.  If you are unable to take in enough food and calories by mouth, your health care provider may recommend a feeding tube. This is  a tube that passes through your nose and throat, directly into your stomach. Nutritional supplement beverages can be given to you through the feeding tube to help you get the nutrients you need.  Take vitamin or mineral supplements as recommended by your health care provider. What foods can I eat? Grains Enriched pasta. Enriched rice. Fortified whole-grain bread. Fortified whole-grain cereal. Barley. Brown rice. Quinoa. Millet. Vegetables All fresh, frozen, and canned vegetables. Spinach. Kale. Artichoke. Carrots. Winter squash and pumpkin. Sweet potatoes. Broccoli. Cabbage. Cucumbers. Tomatoes.  Sweet peppers. Green beans. Peas. Corn. Fruits All fresh and frozen fruits. Berries. Grapes. Mango. Papaya. Guava. Cherries. Apples. Bananas. Peaches. Plums. Pineapple. Watermelon. Cantaloupe. Oranges. Avocado. Meats and Other Protein Sources Beef liver. Lean beef. Pork. Fresh and canned chicken. Fresh fish. Oysters. Sardines. Canned tuna. Shrimp. Eggs with yolks. Nuts and seeds. Peanut butter. Beans and lentils. Soybeans. Tofu. Dairy Whole, low-fat, and nonfat milk. Whole, low-fat, and nonfat yogurt. Cottage cheese. Sour cream. Hard and soft cheeses. Beverages Water. Herbal tea. Decaffeinated coffee. Decaffeinated green tea. 100% fruit juice. 100% vegetable juice. Instant breakfast shakes. Condiments Ketchup. Mayonnaise. Mustard. Salad dressing. Barbecue sauce. Sweets and Desserts Sugar-free ice cream. Sugar-free pudding. Sugar-free gelatin. Fats and Oils Butter. Vegetable oil, flaxseed oil, olive oil, and walnut oil. Other Complete nutrition shakes. Protein bars. Sugar-free gum. The items listed above may not be a complete list of recommended foods or beverages. Contact your dietitian for more options. What foods are not recommended? Grains Sugar-sweetened breakfast cereals. Flavored instant oatmeal. Fried breads. Vegetables Breaded or deep-fried vegetables. Fruits Dried fruit with added sugar. Candied fruit. Canned fruit in syrup. Meats and Other Protein Sources Breaded or deep-fried meats. Dairy Flavored milks. Fried cheese curds or fried cheese sticks. Beverages Alcohol. Sugar-sweetened soft drinks. Sugar-sweetened tea. Caffeinated coffee and tea. Condiments Sugar. Honey. Agave nectar. Molasses. Sweets and Desserts Chocolate. Cake. Cookies. Candy. Other Potato chips. Pretzels. Salted nuts. Candied nuts. The items listed above may not be a complete list of foods and beverages to avoid. Contact your dietitian for more information. This information is not intended to replace  advice given to you by your health care provider. Make sure you discuss any questions you have with your health care provider. Document Released: 11/26/2004 Document Revised: 06/11/2015 Document Reviewed: 09/04/2013 Elsevier Interactive Patient Education  2018 [employer].   Alcohol Withdrawal When a person who drinks a lot of alcohol stops drinking, he or she may go through alcohol withdrawal. Alcohol withdrawal causes problems. It can make you feel:  Tired (fatigued).  Sad (depressed).  Fearful (anxious).  Grouchy (irritable).  Not hungry.  Sick to your stomach (nauseous).  Shaky.  It can also make you have:  Nightmares.  Trouble sleeping.  Trouble thinking clearly.  Mood swings.  Clammy skin.  Very bad sweating.  A very fast heartbeat.  Shaking that you cannot control (tremor).  Having a fever.  A fit of movements that you cannot control (seizure).  Confusion.  Throwing up (vomiting).  Feeling or seeing things that are not there (hallucinations).  Follow these instructions at home:  Take medicines and vitamins only as told by your doctor.  Do not drink alcohol.  Have someone around in case you need help.  Drink enough fluid to keep your pee (urine) clear or pale yellow.  Think about joining a group to help you stop drinking. Contact a doctor if:  Your problems get worse.  Your problems do not go away.  You cannot eat or drink without throwing  up.  You are having a hard time not drinking alcohol.  You cannot stop drinking alcohol. Get help right away if:  You feel your heart beating differently than usual.  Your chest hurts.  You have trouble breathing.  You have very bad problems, like: ? A fever. ? A fit of movements that you cannot control. ? Being very confused. ? Feeling or seeing things that are not there. This information is not intended to replace advice given to you by your health care provider. Make sure you discuss  any questions you have with your health care provider. Document Released: 07/21/2007 Document Revised: 07/10/2015 Document Reviewed: 11/20/2013 Elsevier Interactive Patient Education  2018 [employer].   Alcohol Use Disorder Alcohol use disorder is when your drinking disrupts your daily life. When you have this condition, you drink too much alcohol and you cannot control your drinking. Alcohol use disorder can cause serious problems with your physical health. It can affect your brain, heart, liver, pancreas, immune system, stomach, and intestines. Alcohol use disorder can increase your risk for certain cancers and cause problems with your mental health, such as depression, anxiety, psychosis, delirium, and dementia. People with this disorder risk hurting themselves and others. What are the causes? This condition is caused by drinking too much alcohol over time. It is not caused by drinking too much alcohol only one or two times. Some people with this condition drink alcohol to cope with or escape from negative life events. Others drink to relieve pain or symptoms of mental illness. What increases the risk? You are more likely to develop this condition if:  You have a family history of alcohol use disorder.  Your culture encourages drinking to the point of intoxication, or makes alcohol easy to get.  You had a mood or conduct disorder in childhood.  You have been a victim of abuse.  You are an adolescent and: ? You have poor grades or difficulties in school. ? Your caregivers do not talk to you about saying no to alcohol, or supervise your activities. ? You are impulsive or you have trouble with self-control.  What are the signs or symptoms? Symptoms of this condition include:  Drinkingmore than you want to.  Drinking for longer than you want to.  Trying several times to drink less or to control your drinking.  Spending a lot of time getting alcohol, drinking, or recovering from  drinking.  Craving alcohol.  Having problems at work, at school, or at home due to drinking.  Having problems in relationships due to drinking.  Drinking when it is dangerous to drink, such as before driving a car.  Continuing to drink even though you know you might have a physical or mental problem related to drinking.  Needing more and more alcohol to get the same effect you want from the alcohol (building up tolerance).  Having symptoms of withdrawal when you stop drinking. Symptoms of withdrawal include: ? Fatigue. ? Nightmares. ? Trouble sleeping. ? Depression. ? Anxiety. ? Fever. ? Seizures. ? Severe confusion. ? Feeling or seeing things that are not there (hallucinations). ? Tremors. ? Rapid heart rate. ? Rapid breathing. ? High blood pressure.  Drinking to avoid symptoms of withdrawal.  How is this diagnosed? This condition is diagnosed with an assessment. Your health care provider may start the assessment by asking three or four questions about your drinking. Your health care provider may perform a physical exam or do lab tests to see if you have physical  problems resulting from alcohol use. She or he may refer you to a mental health professional for evaluation. How is this treated? Some people with alcohol use disorder are able to reduce their alcohol use to low-risk levels. Others need to completely quit drinking alcohol. When necessary, mental health professionals with specialized training in substance use treatment can help. Your health care provider can help you decide how severe your alcohol use disorder is and what type of treatment you need. The following forms of treatment are available:  Detoxification. Detoxification involves quitting drinking and using prescription medicines within the first week to help lessen withdrawal symptoms. This treatment is important for people who have had withdrawal symptoms before and for heavy drinkers who are likely to have  withdrawal symptoms. Alcohol withdrawal can be dangerous, and in severe cases, it can cause death. Detoxification may be provided in a home, community, or primary care setting, or in a hospital or substance use treatment facility.  Counseling. This treatment is also called talk therapy. It is provided by substance use treatment counselors. A counselor can address the reasons you use alcohol and suggest ways to keep you from drinking again or to prevent problem drinking. The goals of talk therapy are to: ? Find healthy activities and ways for you to cope with stress. ? Identify and avoid the things that trigger your alcohol use. ? Help you learn how to handle cravings.  Medicines.Medicines can help treat alcohol use disorder by: ? Decreasing alcohol cravings. ? Decreasing the positive feeling you have when you drink alcohol. ? Causing an uncomfortable physical reaction when you drink alcohol (aversion therapy).  Support groups. Support groups are led by people who have quit drinking. They provide emotional support, advice, and guidance.  These forms of treatment are often combined. Some people with this condition benefit from a combination of treatments provided by specialized substance use treatment centers. Follow these instructions at home:  Take over-the-counter and prescription medicines only as told by your health care provider.  Check with your health care provider before starting any new medicines.  Ask friends and family members not to offer you alcohol.  Avoid situations where alcohol is served, including gatherings where others are drinking alcohol.  Create a plan for what to do when you are tempted to use alcohol.  Find hobbies or activities that you enjoy that do not include alcohol.  Keep all follow-up visits as told by your health care provider. This is important. How is this prevented?  If you drink, limit alcohol intake to no more than 1 drink a day for nonpregnant  women and 2 drinks a day for men. One drink equals 12 oz of beer, 5 oz of wine, or 1 oz of hard liquor.  If you have a mental health condition, get treatment and support.  Do not give alcohol to adolescents.  If you are an adolescent: ? Do not drink alcohol. ? Do not be afraid to say no if someone offers you alcohol. Speak up about why you do not want to drink. You can be a positive role model for your friends and set a good example for those around you by not drinking alcohol. ? If your friends drink, spend time with others who do not drink alcohol. Make new friends who do not use alcohol. ? Find healthy ways to manage stress and emotions, such as meditation or deep breathing, exercise, spending time in nature, listening to music, or talking with a trusted friend or family  member. Contact a health care provider if:  You are not able to take your medicines as told.  Your symptoms get worse.  You return to drinking alcohol (relapse) and your symptoms get worse. Get help right away if:  You have thoughts about hurting yourself or others. If you ever feel like you may hurt yourself or others, or have thoughts about taking your own life, get help right away. You can go to your nearest emergency department or call:  Your local emergency services (911 in the [place]  A suicide crisis helpline, such as the National Suicide Prevention Lifeline at (236) 119-1009. This is open 24 hours a day.  Summary  Alcohol use disorder is when your drinking disrupts your daily life. When you have this condition, you drink too much alcohol and you cannot control your drinking.  Treatment may include detoxification, counseling, medicine, and support groups.  Ask friends and family members not to offer you alcohol. Avoid situations where alcohol is served.  Get help right away if you have thoughts about hurting yourself or others. This information is not intended to replace advice given to you by your  health care provider. Make sure you discuss any questions you have with your health care provider. Document Released: 03/11/2004 Document Revised: 10/30/2015 Document Reviewed: 10/30/2015 Elsevier Interactive Patient Education  2018 [employer]   Blood Glucose Monitoring, Adult Monitoring your blood sugar (glucose) helps you manage your diabetes. It also helps you and your health care provider determine how well your diabetes management plan is working. Blood glucose monitoring involves checking your blood glucose as often as directed, and keeping a record (log) of your results over time. Why should I monitor my blood glucose? Checking your blood glucose regularly can:  Help you understand how food, exercise, illnesses, and medicines affect your blood glucose.  Let you know what your blood glucose is at any time. You can quickly tell if you are having low blood glucose (hypoglycemia) or high blood glucose (hyperglycemia).  Help you and your health care provider adjust your medicines as needed.  When should I check my blood glucose? Follow instructions from your health care provider about how often to check your blood glucose. This may depend on:  The type of diabetes you have.  How well-controlled your diabetes is.  Medicines you are taking.  If you have type 1 diabetes:  Check your blood glucose at least 2 times a day.  Also check your blood glucose: ? Before every insulin injection. ? Before and after exercise. ? Between meals. ? 2 hours after a meal. ? Occasionally between 2:00 a.m. and 3:00 a.m., as directed. ? Before potentially dangerous tasks, like driving or using heavy machinery. ? At bedtime.  You may need to check your blood glucose more often, up to 6-10 times a day: ? If you use an insulin pump. ? If you need multiple daily injections (MDI). ? If your diabetes is not well-controlled. ? If you are ill. ? If you have a history of severe hypoglycemia. ? If you  have a history of not knowing when your blood glucose is getting low (hypoglycemia unawareness). If you have type 2 diabetes:  If you take insulin or other diabetes medicines, check your blood glucose at least 2 times a day.  If you are on intensive insulin therapy, check your blood glucose at least 4 times a day. Occasionally, you may also need to check between 2:00 a.m. and 3:00 a.m., as directed.  Also check  your blood glucose: ? Before and after exercise. ? Before potentially dangerous tasks, like driving or using heavy machinery.  You may need to check your blood glucose more often if: ? Your medicine is being adjusted. ? Your diabetes is not well-controlled. ? You are ill. What is a blood glucose log?  A blood glucose log is a record of your blood glucose readings. It helps you and your health care provider: ? Look for patterns in your blood glucose over time. ? Adjust your diabetes management plan as needed.  Every time you check your blood glucose, write down your result and notes about things that may be affecting your blood glucose, such as your diet and exercise for the day.  Most glucose meters store a record of glucose readings in the meter. Some meters allow you to download your records to a computer. How do I check my blood glucose? Follow these steps to get accurate readings of your blood glucose: Supplies needed   Blood glucose meter.  Test strips for your meter. Each meter has its own strips. You must use the strips that come with your meter.  A needle to prick your finger (lancet). Do not use lancets more than once.  A device that holds the lancet (lancing device).  A journal or log book to write down your results. Procedure  Wash your hands with soap and water.  Prick the side of your finger (not the tip) with the lancet. Use a different finger each time.  Gently rub the finger until a small drop of blood appears.  Follow instructions that come with  your meter for inserting the test strip, applying blood to the strip, and using your blood glucose meter.  Write down your result and any notes. Alternative testing sites  Some meters allow you to use areas of your body other than your finger (alternative sites) to test your blood.  If you think you may have hypoglycemia, or if you have hypoglycemia unawareness, do not use alternative sites. Use your finger instead.  Alternative sites may not be as accurate as the fingers, because blood flow is slower in these areas. This means that the result you get may be delayed, and it may be different from the result that you would get from your finger.  The most common alternative sites are: ? Forearm. ? Thigh. ? Palm of the hand. Additional tips  Always keep your supplies with you.  If you have questions or need help, all blood glucose meters have a 24-hour hotline number that you can call. You may also contact your health care provider.  After you use a few boxes of test strips, adjust (calibrate) your blood glucose meter by following instructions that came with your meter. This information is not intended to replace advice given to you by your health care provider. Make sure you discuss any questions you have with your health care provider. Document Released: 02/04/2003 Document Revised: 08/22/2015 Document Reviewed: 07/14/2015 Elsevier Interactive Patient Education  2017 [employer]   Dehydration, Adult Dehydration is a condition in which there is not enough fluid or water in the body. This happens when you lose more fluids than you take in. Important organs, such as the kidneys, brain, and heart, cannot function without a proper amount of fluids. Any loss of fluids from the body can lead to dehydration. Dehydration can range from mild to severe. This condition should be treated right away to prevent it from becoming severe. What are the  causes? This condition may be caused  by:  Vomiting.  Diarrhea.  Excessive sweating, such as from heat exposure or exercise.  Not drinking enough fluid, especially: ? When ill. ? While doing activity that requires a lot of energy.  Excessive urination.  Fever.  Infection.  Certain medicines, such as medicines that cause the body to lose excess fluid (diuretics).  Inability to access safe drinking water.  Reduced physical ability to get adequate water and food.  What increases the risk? This condition is more likely to develop in people:  Who have a poorly controlled long-term (chronic) illness, such as diabetes, heart disease, or kidney disease.  Who are age 61 or older.  Who are disabled.  Who live in a place with high altitude.  Who play endurance sports.  What are the signs or symptoms? Symptoms of mild dehydration may include:  Thirst.  Dry lips.  Slightly dry mouth.  Dry, warm skin.  Dizziness. Symptoms of moderate dehydration may include:  Very dry mouth.  Muscle cramps.  Dark urine. Urine may be the color of tea.  Decreased urine production.  Decreased tear production.  Heartbeat that is irregular or faster than normal (palpitations).  Headache.  Light-headedness, especially when you stand up from a sitting position.  Fainting (syncope). Symptoms of severe dehydration may include:  Changes in skin, such as: ? Cold and clammy skin. ? Blotchy (mottled) or pale skin. ? Skin that does not quickly return to normal after being lightly pinched and released (poor skin turgor).  Changes in body fluids, such as: ? Extreme thirst. ? No tear production. ? Inability to sweat when body temperature is high, such as in hot weather. ? Very little urine production.  Changes in vital signs, such as: ? Weak pulse. ? Pulse that is more than 100 beats a minute when sitting still. ? Rapid breathing. ? Low blood pressure.  Other changes, such as: ? Sunken eyes. ? Cold hands and  feet. ? Confusion. ? Lack of energy (lethargy). ? Difficulty waking up from sleep. ? Short-term weight loss. ? Unconsciousness. How is this diagnosed? This condition is diagnosed based on your symptoms and a physical exam. Blood and urine tests may be done to help confirm the diagnosis. How is this treated? Treatment for this condition depends on the severity. Mild or moderate dehydration can often be treated at home. Treatment should be started right away. Do not wait until dehydration becomes severe. Severe dehydration is an emergency and it needs to be treated in a hospital. Treatment for mild dehydration may include:  Drinking more fluids.  Replacing salts and minerals in your blood (electrolytes) that you may have lost. Treatment for moderate dehydration may include:  Drinking an oral rehydration solution (ORS). This is a drink that helps you replace fluids and electrolytes (rehydrate). It can be found at pharmacies and retail stores. Treatment for severe dehydration may include:  Receiving fluids through an IV tube.  Receiving an electrolyte solution through a feeding tube that is passed through your nose and into your stomach (nasogastric tube, or NG tube).  Correcting any abnormalities in electrolytes.  Treating the underlying cause of dehydration. Follow these instructions at home:  If directed by your health care provider, drink an ORS: ? Make an ORS by following instructions on the package. ? Start by drinking small amounts, about  cup (120 mL) every 5-10 minutes. ? Slowly increase how much you drink until you have taken the amount recommended by your health  care provider.  Drink enough clear fluid to keep your urine clear or pale yellow. If you were told to drink an ORS, finish the ORS first, then start slowly drinking other clear fluids. Drink fluids such as: ? Water. Do not drink only water. Doing that can lead to having too little salt (sodium) in the body  (hyponatremia). ? Ice chips. ? Fruit juice that you have added water to (diluted fruit juice). ? Low-calorie sports drinks.  Avoid: ? Alcohol. ? Drinks that contain a lot of sugar. These include high-calorie sports drinks, fruit juice that is not diluted, and soda. ? Caffeine. ? Foods that are greasy or contain a lot of fat or sugar.  Take over-the-counter and prescription medicines only as told by your health care provider.  Do not take sodium tablets. This can lead to having too much sodium in the body (hypernatremia).  Eat foods that contain a healthy balance of electrolytes, such as bananas, oranges, potatoes, tomatoes, and spinach.  Keep all follow-up visits as told by your health care provider. This is important. Contact a health care provider if:  You have abdominal pain that: ? Gets worse. ? Stays in one area (localizes).  You have a rash.  You have a stiff neck.  You are more irritable than usual.  You are sleepier or more difficult to wake up than usual.  You feel weak or dizzy.  You feel very thirsty.  You have urinated only a small amount of very dark urine over 6-8 hours. Get help right away if:  You have symptoms of severe dehydration.  You cannot drink fluids without vomiting.  Your symptoms get worse with treatment.  You have a fever.  You have a severe headache.  You have vomiting or diarrhea that: ? Gets worse. ? Does not go away.  You have blood or green matter (bile) in your vomit.  You have blood in your stool. This may cause stool to look black and tarry.  You have not urinated in 6-8 hours.  You faint.  Your heart rate while sitting still is over 100 beats a minute.  You have trouble breathing. This information is not intended to replace advice given to you by your health care provider. Make sure you discuss any questions you have with your health care provider. Document Released: 02/01/2005 Document Revised: 08/29/2015 Document  Reviewed: 03/28/2015 Elsevier Interactive Patient Education  2018 [employer].   Delirium Tremens Delirium tremens, also known as DTs, is the most severe form of alcohol withdrawal. Alcohol withdrawal is a group of mental and physical symptoms that can develop when a person who drinks heavily and regularly stops drinking or drinks less than usual. Delirium tremens is a serious condition and can be life-threatening. It must be treated in a hospital or a treatment center. What are the causes? Heavy and regular drinking can cause chemicals that send signals from the brain to the body (neurotransmitters) to deactivate. Alcohol withdrawal, including DTs, develops when deactivated neurotransmitters reactivate because a person stops drinking or drinks less than usual. What increases the risk? The more a person drinks and the longer he or she drinks, the greater the risk of DTs. This condition is more likely to develop in:  People who have had severe alcohol withdrawal or DTs in the past.  People who have had a seizure during a previous episode of alcohol withdrawal.  Elderly people.  Pregnant women.  People who use illegal drugs.  People who take medicines to  help them relax (sedatives).  People who have certain medical problems, including: ? Infection. ? Heart, lung, or liver disease. ? History of seizures. ? Mental health problems.  What are the signs or symptoms? Early symptoms of alcohol withdrawal happen before symptoms of DTs. These symptoms may start within 6 hours after the most recent drink, and they may last for 5-7 days. Early symptoms of alcohol withdrawal may include:  Difficulty sleeping.  Anxiety.  Difficulty thinking clearly.  Irritability.  Fast heart rate.  Fast breathing.  Headache.  Nausea and vomiting.  Fever.  Excessive sweating.  A heartbeat that feels irregular or faster than normal (palpitations).  Involuntary shaking or trembling (tremor) of  a body part.  Symptoms of DTs may begin after early withdrawal symptoms. These symptoms may start to develop 3 days after the most recent drink. They may last for up to 8 days. Symptoms of DTs may include:  Changes in attention and awareness.  Sudden changes in heart rate and rhythm, body temperature, and blood pressure (vital signs).  Feeling or seeing things that other people do not feel or see (hallucinations).  Extreme sleepiness (stupor).  Extreme confusion.  Seizures.  How is this diagnosed? This condition may be diagnosed based on:  Medical history.  History of alcohol consumption.  Symptoms.  Blood tests.  Urine tests.  A physical exam. The health care provider may check for: ? Fever. ? High blood pressure. ? Rapid heart rate. ? Overactive reflexes. ? Tremors.  How is this treated? Treatment for this condition is done in a hospital or treatment center. It may include:  Monitoring your vital signs.  Receiving fluids and minerals (electrolytes) through an IV tube.  Sedatives.  Medicines that reduce anxiety.  Medicines to control your blood pressure.  Medicines to prevent or control seizures.  Multivitamins and B vitamins.  Having ongoing counseling and mental health support if you have a history of alcohol abuse or if you used an illegal drug.  Follow these instructions at home:   Do not drink alcohol.  Take over-the-counter and prescription medicines only as told by your health care provider.  Do not drive or operate heavy machinery until your health care provider approves.  Drink enough fluid to keep your urine clear or pale yellow.  Have someone stay with you or be available to you in case you need help.  Consider joining a 12-step program or another alcohol support group.  Keep all follow-up visits as told by your health care provider. This is important. Contact a health care provider if:  You have symptoms that get worse or do not go  away.  You cannot eat or drink without vomiting.  You are struggling with not drinking alcohol.  You cannot stop drinking alcohol. Get help right away if:  You have a seizure.  You have a fever along with other symptoms of alcohol withdrawal.  You become extremely confused.  You have hallucinations.  You become extremely sleepy.  You vomit blood.  You have extreme tremors or sweating.  You have palpitations.  You have chest pain.  You have difficulty breathing. These symptoms may represent a serious problem that is an emergency. Do not wait to see if the symptoms will go away. Get medical help right away. Call your local emergency services (911 in the [place] Do not drive yourself to the hospital. This information is not intended to replace advice given to you by your health care provider. Make sure you discuss any  questions you have with your health care provider. Document Released: 02/01/2005 Document Revised: 07/10/2015 Document Reviewed: 07/24/2014 Elsevier Interactive Patient Education  2018 [employer].   Hyperglycemia Hyperglycemia occurs when the level of sugar (glucose) in the blood is too high. Glucose is a type of sugar that provides the body's main source of energy. Certain hormones (insulin and glucagon) control the level of glucose in the blood. Insulin lowers blood glucose, and glucagon increases blood glucose. Hyperglycemia can result from having too little insulin in the bloodstream, or from the body not responding normally to insulin. Hyperglycemia occurs most often in people who have diabetes (diabetes mellitus), but it can happen in people who do not have diabetes. It can develop quickly, and it can be life-threatening if it causes you to become severely dehydrated (diabetic ketoacidosis or hyperglycemic hyperosmolar state). Severe hyperglycemia is a medical emergency. What are the causes? If you have diabetes, hyperglycemia may be caused by:  Diabetes  medicine.  Medicines that increase blood glucose or affect your diabetes control.  Not eating enough, or not eating often enough.  Changes in physical activity level.  Being sick or having an infection.  If you have prediabetes or undiagnosed diabetes:  Hyperglycemia may be caused by those conditions.  If you do not have diabetes, hyperglycemia may be caused by:  Certain medicines, including steroid medicines, beta-blockers, epinephrine, and thiazide diuretics.  Stress.  Serious illness.  Surgery.  Diseases of the pancreas.  Infection.  What increases the risk? Hyperglycemia is more likely to develop in people who have risk factors for diabetes, such as:  Having a family member with diabetes.  Having a gene for type 1 diabetes that is passed from parent to child (inherited).  Living in an area with cold weather conditions.  Exposure to certain viruses.  Certain conditions in which the body's disease-fighting (immune) system attacks itself (autoimmune disorders).  Being overweight or obese.  Having an inactive (sedentary) lifestyle.  Having been diagnosed with insulin resistance.  Having a history of prediabetes, gestational diabetes, or polycystic ovarian syndrome (PCOS).  Being of American-Indian, African-American, Hispanic/Latino, or Asian/Pacific Islander descent.  What are the signs or symptoms? Hyperglycemia may not cause any symptoms. If you do have symptoms, they may include early warning signs, such as:  Increased thirst.  Hunger.  Feeling very tired.  Needing to urinate more often than usual.  Blurry vision.  Other symptoms may develop if hyperglycemia gets worse, such as:  Dry mouth.  Loss of appetite.  Fruity-smelling breath.  Weakness.  Unexpected or rapid weight gain or weight loss.  Tingling or numbness in the hands or feet.  Headache.  Skin that does not quickly return to normal after being lightly pinched and released  (poor skin turgor).  Abdominal pain.  Cuts or bruises that are slow to heal.  How is this diagnosed? Hyperglycemia is diagnosed with a blood test to measure your blood glucose level. This blood test is usually done while you are having symptoms. Your health care provider may also do a physical exam and review your medical history. You may have more tests to determine the cause of your hyperglycemia, such as:  A fasting blood glucose (FBG) test. You will not be allowed to eat (you will fast) for at least 8 hours before a blood sample is taken.  An A1c (hemoglobin A1c) blood test. This provides information about blood glucose control over the previous 2-3 months.  An oral glucose tolerance test (OGTT). This measures your  blood glucose at two times: ? After fasting. This is your baseline blood glucose level. ? Two hours after drinking a beverage that contains glucose.  How is this treated? Treatment depends on the cause of your hyperglycemia. Treatment may include:  Taking medicine to regulate your blood glucose levels. If you take insulin or other diabetes medicines, your medicine or dosage may be adjusted.  Lifestyle changes, such as exercising more, eating healthier foods, or losing weight.  Treating an illness or infection, if this caused your hyperglycemia.  Checking your blood glucose more often.  Stopping or reducing steroid medicines, if these caused your hyperglycemia.  If your hyperglycemia becomes severe and it results in hyperglycemic hyperosmolar state, you must be hospitalized and given IV fluids. Follow these instructions at home: General instructions  Take over-the-counter and prescription medicines only as told by your health care provider.  Do not use any products that contain nicotine or tobacco, such as cigarettes and e-cigarettes. If you need help quitting, ask your health care provider.  Limit alcohol intake to no more than 1 drink per day for nonpregnant  women and 2 drinks per day for men. One drink equals 12 oz of beer, 5 oz of wine, or 1 oz of hard liquor.  Learn to manage stress. If you need help with this, ask your health care provider.  Keep all follow-up visits as told by your health care provider. This is important. Eating and drinking  Maintain a healthy weight.  Exercise regularly, as directed by your health care provider.  Stay hydrated, especially when you exercise, get sick, or spend time in hot temperatures.  Eat healthy foods, such as: ? Lean proteins. ? Complex carbohydrates. ? Fresh fruits and vegetables. ? Low-fat dairy products. ? Healthy fats.  Drink enough fluid to keep your urine clear or pale yellow. If you have diabetes:   Make sure you know the symptoms of hyperglycemia.  Follow your diabetes management plan, as told by your health care provider. Make sure you: ? Take your insulin and medicines as directed. ? Follow your exercise plan. ? Follow your meal plan. Eat on time, and do not skip meals. ? Check your blood glucose as often as directed. Make sure to check your blood glucose before and after exercise. If you exercise longer or in a different way than usual, check your blood glucose more often. ? Follow your sick day plan whenever you cannot eat or drink normally. Make this plan in advance with your health care provider.  Share your diabetes management plan with people in your workplace, school, and household.  Check your urine for ketones when you are ill and as told by your health care provider.  Carry a medical alert card or wear medical alert jewelry. Contact a health care provider if:  Your blood glucose is at or above 240 mg/dL (16.1 mmol/L) for 2 days in a row.  You have problems keeping your blood glucose in your target range.  You have frequent episodes of hyperglycemia. Get help right away if:  You have difficulty breathing.  You have a change in how you think, feel, or act  (mental status).  You have nausea or vomiting that does not go away. These symptoms may represent a serious problem that is an emergency. Do not wait to see if the symptoms will go away. Get medical help right away. Call your local emergency services (911 in the [place] Do not drive yourself to the hospital. Summary  Hyperglycemia occurs  when the level of sugar (glucose) in the blood is too high.  Hyperglycemia is diagnosed with a blood test to measure your blood glucose level. This blood test is usually done while you are having symptoms. Your health care provider may also do a physical exam and review your medical history.  If you have diabetes, follow your diabetes management plan as told by your health care provider.  Contact your health care provider if you have problems keeping your blood glucose in your target range. This information is not intended to replace advice given to you by your health care provider. Make sure you discuss any questions you have with your health care provider. Document Released: 07/28/2000 Document Revised: 10/20/2015 Document Reviewed: 10/20/2015 Elsevier Interactive Patient Education  2018 [employer].   Finding Treatment for Addiction What is addiction? Addiction is a complex disease of the brain. It causes an uncontrollable (compulsive) need for a substance. You can be addicted to alcohol, illegal drugs, or prescription medicines such as painkillers. Addiction can also be a behavior, like gambling or shopping. The need for the drug or activity can become so strong that you think about it all the time. You can also become physically dependent on a substance. Addiction can change the way your brain works. Because of these changes, getting more of whatever you are addicted to becomes the most important thing to you and feels better than other activities or relationships. Addiction can lead to changes in health, behavior, emotions, relationships, and choices that  affect you and everyone around you. How do I know if I need treatment for addiction? Addiction is a progressive disease. Without treatment, addiction can get worse. Living with addiction puts you at higher risk for injury, poor health, lost employment, loss of money, and even death. You might need treatment for addiction if:  You have tried to stop or cut down, but you cannot.  Your addiction is causing physical health problems.  You find it annoying that your friends and family are concerned about your alcohol or substance use.  You feel guilty about substance abuse or a compulsive behavior.  You have lied or tried to hide your addiction.  You need a particular substance or activity to start your day or to calm down.  You are getting in trouble at school, work, home, or with the police.  You have done something illegal to support your addiction.  You are running out of money because of your addiction.  You have no time for anything other than your addiction.  What types of treatment are available? The treatment program that is right for you will depend on many factors, including the type of addiction you have. Treatment programs can be outpatient or inpatient. In an outpatient program, you live at home and go to work or school, but you also go to a clinic for treatment. With an inpatient program, you live and sleep at the program facility during treatment. After treatment, you might need a plan for support during recovery. Other treatment options include:  Medicine. ? Some addictions may be treated with prescription medicines. ? You might also need medicine to treat anxiety or depression.  Counseling and behavior therapy. Therapy can help individuals and families behave in healthier ways and relate more effectively.  Support groups. Confidential group therapy, such as a 12-step program, can help individuals and families during treatment and recovery.  No single type of program is  right for everyone. Many treatment programs involve a combination of  education, counseling, and a 12-step, spiritually-based approach. Some treatment programs are government sponsored. They are geared for patients who do not have private insurance. Treatment programs can vary in many respects, such as:  Cost and types of insurance that are accepted.  Types of on-site medical services that are offered.  Length of stay, setting, and size.  Overall philosophy of treatment.  What should I consider when selecting a treatment program? It is important to think about your individual requirements when selecting a treatment program. There are a number of things to consider, such as:  If the program is certified by the appropriate government agency. Even private programs must be certified and employ certified professionals.  If the program is covered by your insurance. If finances are a concern, the first call you should make is to your insurance company, if you have health insurance. Ask for a list of treatment programs that are in your network, and confirm any copayments and deductibles that you may have to pay. ? If you do not have insurance, or if you choose to attend a program that does not accept your insurance, discuss whether a payment plan can be set up.  If treatment is available in languages other than English, if needed.  If the program offers detoxification treatment, if needed.  If 12-step meetings are held at the center or if transport is available for patients to attend meetings at other locations.  If the program is professional, organized, and clean.  If the program meets all of your needs, including physical and cultural needs.  If the facility offers specific treatment for your particular addiction.  If support continues to be offered after you have left the program.  If your treatment plan is continually looked at to make sure you are receiving the right treatment at the  right time.  If mental health counseling is part of your treatment.  If medicine is included in treatment, if needed.  If your family is included in your treatment plan and if support is offered to them throughout the treatment process.  How the treatment works to prevent relapse.  Where else can I get help?  Your health care provider. Ask him or her to help you find addiction treatment. These discussions are confidential.  The [employer] on Alcoholism and Drug Dependence (NCADD). This group has information about treatment centers and programs for people who have an addiction and for family members. ? The telephone number is 1-800-NCA-CALL (867-361-8159). ? The website is https://ncadd.org/about-ncadd/our-affiliates  The Substance Abuse and Mental Health Services Administration [facility]). This group will help you find publicly funded treatment centers, help hotlines, and counseling services near you. ? The telephone number is 1-800-662-HELP (657 783 7648). ? The website is www.findtreatment.[employer].pl In countries outside of the [place]. and [place], look in [employer] for contact information for services in your area. This information is not intended to replace advice given to you by your health care provider. Make sure you discuss any questions you have with your health care provider. Document Released: 12/31/2004 Document Revised: 12/29/2015 Document Reviewed: 11/20/2013 Elsevier Interactive Patient Education  2017 [employer].

## 2017-02-19 NOTE — Care Management Note (Addendum)
Case Management Note  Patient Details  Name: Shawn Watkins MRN: [id number] Date of Birth: 1980-09-19  Subjective/Objective:  Pt admitted with withdrawal symptoms                 Action/Plan:  PTA independent from home - plans to move back to [place] within the next week to be closer to family.  Pt informed CM that he does not have a PCP but does have medicaid and can afford copays for newly prescribed prescriptions in addition to his newly order glucose equipment - per pt has been able to obtain medications under his current medicaid benefit recently.  CM [facility] and instructed pt to contact DSS once he has moved and request local PCP.  NO CM needs determined at the time this not was written - CM signing off   Expected Discharge Date:  02/19/17               Expected Discharge Plan:  Home/Self Care  In-House Referral:     Discharge planning Services  CM Consult  Post Acute Care Choice:    Choice offered to:     DME Arranged:    DME Agency:     HH Arranged:    HH Agency:     Status of Service:  Completed, signed off  If discussed at [employer] Length of Stay Meetings, dates discussed:    Additional Comments:  Shawn Watkins, Shawn Watkins S, RN 02/19/2017, 9:03 AM

## 2017-02-19 NOTE — Discharge Summary (Signed)
Physician Discharge Summary  Shawn Watkins YQM:[id number] DOB: 1981/02/06 DOA: 02/18/2017  PCP: Cammie Sickle NP  Admit date: 02/18/2017 Discharge date: 02/19/2017  Admitted From: home  Disposition: Home  Recommendations for Outpatient Follow-up:  1. Follow up with PCP in 4 days 2. Please enroll in outpatient substance treatment program 3. Please follow up on the following pending results:final culture data  Discharge Condition: STABLE   CODE STATUS: FULL    Brief Hospitalization Summary: Please see all hospital notes, images, labs for full details of the hospitalization.  HPI: Shawn Watkins is a 37 y.o. male known chronic alcohol abuser who was recently discharged from [facility] for an admission related to chronic alcoholism.  Apparently the patient went home and drank excessively over the past 6 days and now has presented to the emergency department with alcohol intoxication and acute withdrawal symptoms.  He complains of nausea.  He complains of feeling aches and pains and shakes.  He does have a history of severe alcohol withdrawal.  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  He had received counseling and encouragement to stop drinking during last hospitalization.  The patient was also scheduled with a primary care provider but failed to go to the appointment which was scheduled for yesterday.  ED course.  The patient was given some IV fluids and IV Lorazepam but continued to have withdrawal symptoms and his CIWA score has increased.  Hospital admission was requested for treatment of acute alcohol withdrawal.   1. Acute alcohol withdrawal -unfortunately the patient continued to binge on alcohol when he was discharged from last hospitalization less than 1 week ago.  Will admit for IV fluids and IV lorazepam.  Vitamin supplements ordered with thiamine multivitamins and folic acid.  Will ask for social worker to provide patient with treatment options.  He is admitted to [facility] because of  his history of severe delirium tremens.  The patient has done well overnight and says that he wants to be discharged.  He declined to get assistance with substance abuse treatment.  He says that he is planning to move to [place] this weekend to be near family members who will support him through his recovery. He declines additional assistance at this time and not willing to stay in the hospital any longer.  I will discharge him as he is requesting. I counseled him not to drive or operate machinery until he has followed up with a physician next week.  He verbalized understanding.   2. Tobacco - will offer nicotine patch for cravings. 3. Type 2 Diabetes Mellitus - Pt had been recently prescribed glimepiride for his diabetes but not taking it regularly.  I would love to prescribe metformin but given his heavy alcohol consumption he probably should not be given metformin until we are certain that he is not going to start drinking again.  Resume glimepiride 2 mg daily with breakfast until he can follow up with his PCP.  I ordered him a blood glucose meter kit with testing supplies so that he can test his blood glucose.   DVT Prophylaxis: lovenox Code Status: Full   Family Communication: none present  Disposition Plan: Home    Discharge Diagnoses:  Principal Problem:   Alcohol intoxication ([place]) Active Problems:   ETOH abuse   Alcohol abuse   Alcohol withdrawal ([place])  Discharge Instructions: Discharge Instructions    Increase activity slowly   Complete by:  As directed      Allergies as of 02/19/2017  No Known Allergies     Medication List    TAKE these medications   blood glucose meter kit and supplies Dispense based on patient and insurance preference. Use up to four times daily as directed. (FOR ICD-10 E10.9, E11.9).   folic acid 1 MG tablet Commonly known as:  FOLVITE Take 1 tablet (1 mg total) by mouth daily.   glimepiride 2 MG tablet Commonly known as:  AMARYL Take 1 tablet (2  mg total) by mouth daily with breakfast.   ibuprofen 200 MG tablet Commonly known as:  ADVIL,MOTRIN Take 400-600 mg by mouth every 6 (six) hours as needed for moderate pain.   multivitamin tablet Take 1 tablet by mouth daily.      Follow-up Information    Dorena Dew, FNP. Schedule an appointment as soon as possible for a visit in 4 day(s).   Specialty:  Family Medicine Why:  Hospital Follow Up  Contact information: [place]. [place] [address] [phone]          No Known Allergies Allergies as of 02/19/2017   No Known Allergies     Medication List    TAKE these medications   blood glucose meter kit and supplies Dispense based on patient and insurance preference. Use up to four times daily as directed. (FOR ICD-10 E10.9, E11.9).   folic acid 1 MG tablet Commonly known as:  FOLVITE Take 1 tablet (1 mg total) by mouth daily.   glimepiride 2 MG tablet Commonly known as:  AMARYL Take 1 tablet (2 mg total) by mouth daily with breakfast.   ibuprofen 200 MG tablet Commonly known as:  ADVIL,MOTRIN Take 400-600 mg by mouth every 6 (six) hours as needed for moderate pain.   multivitamin tablet Take 1 tablet by mouth daily.       Procedures/Studies: Dg Chest 2 View  Result Date: 02/06/2017 CLINICAL DATA:  37 year old male with fever, cough, chest pain, vomiting for several days EXAM: CHEST  2 VIEW COMPARISON:  Recent prior chest x-ray and CT scan of the chest 07/27/2016 FINDINGS: The lungs are clear and negative for focal airspace consolidation, pulmonary edema or suspicious pulmonary nodule. No pleural effusion or pneumothorax. Cardiac and mediastinal contours are within normal limits. No acute fracture or lytic or blastic osseous lesions. The visualized upper abdominal bowel gas pattern is unremarkable. IMPRESSION: Negative chest x-ray Electronically Signed   By: Jacqulynn Cadet M.D.   On: 02/06/2017 09:25   Mr 3d Recon At Scanner  Result  Date: 02/09/2017 CLINICAL DATA:  Vomiting, diarrhea, and abdominal pain. Dilated common bile duct. EXAM: MRI ABDOMEN WITHOUT AND WITH CONTRAST (INCLUDING MRCP) TECHNIQUE: Multiplanar multisequence MR imaging of the abdomen was performed both before and after the administration of intravenous contrast. Heavily T2-weighted images of the biliary and pancreatic ducts were obtained, and three-dimensional MRCP images were rendered by post processing. CONTRAST:  47m MULTIHANCE GADOBENATE DIMEGLUMINE 529 MG/ML IV SOLN COMPARISON:  Ultrasound from 04/09/2016. Other exams including CT scan from 07/27/2016 and prior MRI from 07/20/2016. FINDINGS: Despite efforts by the technologist and patient, motion artifact is present on today's exam and could not be eliminated. This reduces exam sensitivity and specificity, and is extremely common when MRCP is attempted on acutely ill inpatients. There is dropout of signal on the MRCP images along the common bile duct for example in the vicinity of image 77/12, obscuring the biliary tree. Lower chest: Dependent atelectasis in the lungs. Hepatobiliary: Gallbladder unremarkable. Diffuse hepatic steatosis with  some geographic scope bearing. Tapered distal CBD along the pancreatic head without an obvious filling defect. Pancreas: Complex 3.6 by 1.5 cm fluid collection or centrally necrotic mass in the pancreatic head extending into the uncinate process. No definite associated enhancement on the subtraction images. There is narrowing of the common bile duct and dorsal pancreatic duct in the vicinity of this lesion. Severe ill definition of pancreatic margins with surrounding edema probably from pancreatitis. Posterior to the uncinate process and anterior to the abdominal aorta of there is a 2.3 by 1.3 cm T2 hyperintense lesion on image 37/6 with high precontrast T1 signal characteristics but no appreciable enhancement. Spleen:  Unremarkable Adrenals/Urinary Tract:  Unremarkable Stomach/Bowel:  Prominent stool throughout the colon favors constipation. No dilated small bowel identified. Vascular/Lymphatic: Unremarkable. Patent portal vein and splenic vein. Other: Low-level edema in the retroperitoneum, peripancreatic region, and mesentery. Musculoskeletal: Unremarkable IMPRESSION: 1. New 3.6 by 1.5 cm complex fluid collection or centrally necrotic mass in the pancreatic head and uncinate process. No definite gas internally. Possibilities might include pseudocyst or abscess. Pancreatic necrosis is not entirely excluded, and correlation with the severity of the patient's clinical scenario is recommended ingesting the aggressiveness of therapy. This area is highly obscured by surrounding pancreatitis and motion artifact as well as some signal loss in the region ; these factors obscure the pancreas and surrounding lesions to a great extent, and reduced specificity in evaluating the new abnormal components of the appearance of the pancreas. 2. There is also a separate complex 2.3 by 1.3 cm lesion just below and posterior to the pancreas which does not appear to enhance them which has high precontrast T1 signal characteristics, likely a small complex pseudocyst. 3. Acute on chronic pancreatitis. 4. Mild CBD dilatation up to 7 mm, with tapering in the vicinity of the pancreatic head probably from extrinsic compression. I do not see any intrinsic lesion in the CBD although the distal CBD is severely obscured and difficulty even visualize in the area of the pancreatic head. 5.  Prominent stool throughout the colon favors constipation. 6. Edema in the retroperitoneum, peripancreatic region, and mesentery favoring acute pancreatitis. Electronically Signed   By: Van Clines M.D.   On: 02/09/2017 10:52   Mr Abdomen Mrcp Moise Boring Contast  Result Date: 02/09/2017 CLINICAL DATA:  Vomiting, diarrhea, and abdominal pain. Dilated common bile duct. EXAM: MRI ABDOMEN WITHOUT AND WITH CONTRAST (INCLUDING MRCP) TECHNIQUE:  Multiplanar multisequence MR imaging of the abdomen was performed both before and after the administration of intravenous contrast. Heavily T2-weighted images of the biliary and pancreatic ducts were obtained, and three-dimensional MRCP images were rendered by post processing. CONTRAST:  42m MULTIHANCE GADOBENATE DIMEGLUMINE 529 MG/ML IV SOLN COMPARISON:  Ultrasound from 04/09/2016. Other exams including CT scan from 07/27/2016 and prior MRI from 07/20/2016. FINDINGS: Despite efforts by the technologist and patient, motion artifact is present on today's exam and could not be eliminated. This reduces exam sensitivity and specificity, and is extremely common when MRCP is attempted on acutely ill inpatients. There is dropout of signal on the MRCP images along the common bile duct for example in the vicinity of image 77/12, obscuring the biliary tree. Lower chest: Dependent atelectasis in the lungs. Hepatobiliary: Gallbladder unremarkable. Diffuse hepatic steatosis with some geographic scope bearing. Tapered distal CBD along the pancreatic head without an obvious filling defect. Pancreas: Complex 3.6 by 1.5 cm fluid collection or centrally necrotic mass in the pancreatic head extending into the uncinate process. No definite associated enhancement on  the subtraction images. There is narrowing of the common bile duct and dorsal pancreatic duct in the vicinity of this lesion. Severe ill definition of pancreatic margins with surrounding edema probably from pancreatitis. Posterior to the uncinate process and anterior to the abdominal aorta of there is a 2.3 by 1.3 cm T2 hyperintense lesion on image 37/6 with high precontrast T1 signal characteristics but no appreciable enhancement. Spleen:  Unremarkable Adrenals/Urinary Tract:  Unremarkable Stomach/Bowel: Prominent stool throughout the colon favors constipation. No dilated small bowel identified. Vascular/Lymphatic: Unremarkable. Patent portal vein and splenic vein. Other:  Low-level edema in the retroperitoneum, peripancreatic region, and mesentery. Musculoskeletal: Unremarkable IMPRESSION: 1. New 3.6 by 1.5 cm complex fluid collection or centrally necrotic mass in the pancreatic head and uncinate process. No definite gas internally. Possibilities might include pseudocyst or abscess. Pancreatic necrosis is not entirely excluded, and correlation with the severity of the patient's clinical scenario is recommended ingesting the aggressiveness of therapy. This area is highly obscured by surrounding pancreatitis and motion artifact as well as some signal loss in the region ; these factors obscure the pancreas and surrounding lesions to a great extent, and reduced specificity in evaluating the new abnormal components of the appearance of the pancreas. 2. There is also a separate complex 2.3 by 1.3 cm lesion just below and posterior to the pancreas which does not appear to enhance them which has high precontrast T1 signal characteristics, likely a small complex pseudocyst. 3. Acute on chronic pancreatitis. 4. Mild CBD dilatation up to 7 mm, with tapering in the vicinity of the pancreatic head probably from extrinsic compression. I do not see any intrinsic lesion in the CBD although the distal CBD is severely obscured and difficulty even visualize in the area of the pancreatic head. 5.  Prominent stool throughout the colon favors constipation. 6. Edema in the retroperitoneum, peripancreatic region, and mesentery favoring acute pancreatitis. Electronically Signed   By: Van Clines M.D.   On: 02/09/2017 10:52   US Abdomen Limited Ruq  Result Date: 02/06/2017 CLINICAL DATA:  Three-day history of fever, nausea and vomiting, and cough. Abnormal liver function tests. EXAM: ULTRASOUND ABDOMEN LIMITED RIGHT UPPER QUADRANT COMPARISON:  MRI abdomen/ MRCP 07/20/2016. Right upper quadrant abdominal ultrasound 07/18/2016. CT abdomen and pelvis 07/17/2016. FINDINGS: Gallbladder: Mildly  distended. No shadowing gallstones or echogenic sludge. No gallbladder wall thickening or pericholecystic fluid. Negative sonographic Murphy's sign according to the ultrasound technologist. Common bile duct: Diameter: Approximately 10 mm, significantly increased in caliber since the prior ultrasound where it measured 2 mm. Diffuse dilation of the common bile duct which can be followed to the pancreatic head. Multiple small shadowing calcifications are identified in the distal common duct. Liver: Diffusely increased and coarsened echotexture without focal hepatic parenchymal abnormality. Portal vein is patent on color Doppler imaging with normal direction of blood flow towards the liver. IMPRESSION: 1. Possible choledocholithiasis. Dilation of the common bile duct up to 10 mm diameter, most likely indicating obstruction. 2. Diffuse hepatic steatosis and/or hepatocellular disease without focal hepatic parenchymal abnormality. 3. Mildly distended gallbladder without visible gallstones. Electronically Signed   By: Evangeline Dakin M.D.   On: 02/06/2017 11:26      Subjective: Pt reports that he feels much better and he wants to go home.  He says he is moving to [place] this weekend to be closer to family.    Discharge Exam: Vitals:   02/19/17 0600 02/19/17 0823  BP: 125/88   Pulse: 69 82  Resp: 15 (!) 21  Temp:  98 F (36.7 C)  SpO2: 94% 96%   Vitals:   02/19/17 0400 02/19/17 0500 02/19/17 0600 02/19/17 0823  BP: (!) 127/91 (!) 134/100 125/88   Pulse: 74 92 69 82  Resp: 16 (!) 28 15 (!) 21  Temp: 98.1 F (36.7 C)   98 F (36.7 C)  TempSrc: Oral   Oral  SpO2: 94% 99% 94% 96%  Weight: 85 kg (187 lb 6.3 oz)     Height:       General: Pt is alert, awake, not in acute distress Cardiovascular: RRR, S1/S2 +, no rubs, no gallops Respiratory: CTA bilaterally, no wheezing, no rhonchi Abdominal: Soft, NT, ND, bowel sounds + Extremities: no edema, no cyanosis   The results of significant  diagnostics from this hospitalization (including imaging, microbiology, ancillary and laboratory) are listed below for reference.     Microbiology: No results found for this or any previous visit (from the past 240 hour(s)).   Labs: BNP (last 3 results) No results for input(s): BNP in the last 8760 hours. Basic Metabolic Panel: Recent Labs  Lab 02/18/17 0216 02/19/17 0504  NA 141 139  K 3.8 4.1  CL 94* 100*  CO2 27 29  GLUCOSE 282* 168*  BUN 5* 11  CREATININE 0.72 0.72  CALCIUM 9.3 9.2  MG  --  1.8   Liver Function Tests: Recent Labs  Lab 02/18/17 0216 02/19/17 0504  AST 56* 40  ALT 72* 48  ALKPHOS 340* 256*  BILITOT 0.5 0.8  PROT 8.3* 6.4*  ALBUMIN 4.8 3.9   No results for input(s): LIPASE, AMYLASE in the last 168 hours. No results for input(s): AMMONIA in the last 168 hours. CBC: Recent Labs  Lab 02/18/17 0216  WBC 4.8  HGB 14.7  HCT 42.4  MCV 85.7  PLT 726*   Cardiac Enzymes: No results for input(s): CKTOTAL, CKMB, CKMBINDEX, TROPONINI in the last 168 hours. BNP: Invalid input(s): POCBNP CBG: Recent Labs  Lab 02/18/17 0745 02/18/17 1707 02/18/17 2245 02/19/17 0822  GLUCAP 211* 349* 240* 201*   D-Dimer No results for input(s): DDIMER in the last 72 hours. Hgb A1c No results for input(s): HGBA1C in the last 72 hours. Lipid Profile No results for input(s): CHOL, HDL, LDLCALC, TRIG, CHOLHDL, LDLDIRECT in the last 72 hours. Thyroid function studies No results for input(s): TSH, T4TOTAL, T3FREE, THYROIDAB in the last 72 hours.  Invalid input(s): FREET3 Anemia work up No results for input(s): VITAMINB12, FOLATE, FERRITIN, TIBC, IRON, RETICCTPCT in the last 72 hours. Urinalysis    Component Value Date/Time   COLORURINE YELLOW 02/09/2017 1810   APPEARANCEUR CLEAR 02/09/2017 1810   LABSPEC 1.008 02/09/2017 1810   PHURINE 7.0 02/09/2017 1810   GLUCOSEU 50 (A) 02/09/2017 1810   HGBUR SMALL (A) 02/09/2017 1810   BILIRUBINUR NEGATIVE 02/09/2017  1810   KETONESUR 5 (A) 02/09/2017 1810   PROTEINUR NEGATIVE 02/09/2017 1810   UROBILINOGEN 1.0 06/06/2010 0949   NITRITE NEGATIVE 02/09/2017 1810   LEUKOCYTESUR NEGATIVE 02/09/2017 1810   Sepsis Labs Invalid input(s): PROCALCITONIN,  WBC,  LACTICIDVEN Microbiology No results found for this or any previous visit (from the past 240 hour(s)).  Time coordinating discharge:   SIGNED:  Irwin Brakeman, MD  [facility] 02/19/2017, 8:38 AM Pager [phone]  If 7PM-7AM, please contact night-coverage [email or web address] Password [email or web address]

## 2017-02-19 NOTE — Progress Notes (Signed)
Pt discharged home. PIV removed. Instructions including medications and prescriptions given to pt. All questions answered. Pt walked out with mother and all belongings.

## 2019-02-18 ENCOUNTER — Emergency Department (HOSPITAL_COMMUNITY)
Admission: EM | Admit: 2019-02-18 | Discharge: 2019-02-18 | Discharge status: Against Medical Advice | Payer: HRSA Program | Attending: Emergency Medicine | Admitting: Emergency Medicine

## 2019-02-18 ENCOUNTER — Other Ambulatory Visit: Payer: Self-pay

## 2019-02-18 ENCOUNTER — Emergency Department (HOSPITAL_COMMUNITY): Payer: HRSA Program

## 2019-02-18 ENCOUNTER — Encounter (HOSPITAL_COMMUNITY): Payer: Self-pay

## 2019-02-18 DIAGNOSIS — R739 Hyperglycemia, unspecified: Secondary | ICD-10-CM

## 2019-02-18 DIAGNOSIS — U071 COVID-19: Secondary | ICD-10-CM | POA: Diagnosis not present

## 2019-02-18 DIAGNOSIS — E1165 Type 2 diabetes mellitus with hyperglycemia: Secondary | ICD-10-CM | POA: Diagnosis not present

## 2019-02-18 DIAGNOSIS — R112 Nausea with vomiting, unspecified: Secondary | ICD-10-CM

## 2019-02-18 DIAGNOSIS — F17210 Nicotine dependence, cigarettes, uncomplicated: Secondary | ICD-10-CM | POA: Diagnosis not present

## 2019-02-18 DIAGNOSIS — Z5320 Procedure and treatment not carried out because of patient's decision for unspecified reasons: Secondary | ICD-10-CM | POA: Insufficient documentation

## 2019-02-18 DIAGNOSIS — Z79899 Other long term (current) drug therapy: Secondary | ICD-10-CM | POA: Diagnosis not present

## 2019-02-18 DIAGNOSIS — Z20822 Contact with and (suspected) exposure to covid-19: Secondary | ICD-10-CM

## 2019-02-18 LAB — COMPREHENSIVE METABOLIC PANEL
ALT: 49 U/L — ABNORMAL HIGH (ref 0–44)
AST: 71 U/L — ABNORMAL HIGH (ref 15–41)
Albumin: 4 g/dL (ref 3.5–5.0)
Alkaline Phosphatase: 189 U/L — ABNORMAL HIGH (ref 38–126)
Anion gap: 22 — ABNORMAL HIGH (ref 5–15)
BUN: 12 mg/dL (ref 6–20)
CO2: 23 mmol/L (ref 22–32)
Calcium: 8.1 mg/dL — ABNORMAL LOW (ref 8.9–10.3)
Chloride: 87 mmol/L — ABNORMAL LOW (ref 98–111)
Creatinine, Ser: 0.61 mg/dL (ref 0.61–1.24)
GFR calc Af Amer: >60 mL/min (ref >60)
GFR calc non Af Amer: >60 mL/min (ref >60)
Glucose, Bld: 306 mg/dL — ABNORMAL HIGH (ref 70–99)
Potassium: 4.3 mmol/L (ref 3.5–5.1)
Sodium: 132 mmol/L — ABNORMAL LOW (ref 135–145)
Total Bilirubin: 1 mg/dL (ref 0.3–1.2)
Total Protein: 7.4 g/dL (ref 6.5–8.1)

## 2019-02-18 LAB — CBC WITH DIFFERENTIAL/PLATELET
Abs Immature Granulocytes: 0.01 10*3/uL (ref 0.00–0.07)
Basophils Absolute: 0 10*3/uL (ref 0.0–0.1)
Basophils Relative: 0 %
Eosinophils Absolute: 0 10*3/uL (ref 0.0–0.5)
Eosinophils Relative: 0 %
HCT: 40.9 % (ref 39.0–52.0)
Hemoglobin: 14 g/dL (ref 13.0–17.0)
Immature Granulocytes: 0 %
Lymphocytes Relative: 18 %
Lymphs Abs: 1.4 10*3/uL (ref 0.7–4.0)
MCH: 29.5 pg (ref 26.0–34.0)
MCHC: 34.2 g/dL (ref 30.0–36.0)
MCV: 86.3 fL (ref 80.0–100.0)
Monocytes Absolute: 0.3 10*3/uL (ref 0.1–1.0)
Monocytes Relative: 4 %
Neutro Abs: 5.9 10*3/uL (ref 1.7–7.7)
Neutrophils Relative %: 78 %
Platelets: 200 10*3/uL (ref 150–400)
RBC: 4.74 MIL/uL (ref 4.22–5.81)
RDW: 12.6 % (ref 11.5–15.5)
WBC: 7.6 10*3/uL (ref 4.0–10.5)
nRBC: 0 % (ref 0.0–0.2)

## 2019-02-18 LAB — LIPASE, BLOOD: Lipase: 13 U/L (ref 11–51)

## 2019-02-18 LAB — CBG MONITORING, ED: Glucose-Capillary: 312 mg/dL — ABNORMAL HIGH (ref 70–99)

## 2019-02-18 MED ORDER — ONDANSETRON 4 MG PO TBDP: 4.0000 mg | ORAL_TABLET | Freq: Three times a day (TID) | ORAL | 0 refills | Status: AC | PRN

## 2019-02-18 MED ORDER — SODIUM CHLORIDE 0.9 % IV BOLUS
1000.0000 mL | Freq: Once | INTRAVENOUS | Status: AC
  Administered 2019-02-18: 1000 mL via INTRAVENOUS

## 2019-02-18 MED ORDER — ONDANSETRON HCL 4 MG/2ML IJ SOLN
4.0000 mg | Freq: Once | INTRAMUSCULAR | Status: AC
  Administered 2019-02-18: 10:00:00 4 mg via INTRAVENOUS
  Filled 2019-02-18: qty 2

## 2019-02-18 MED ORDER — KETOROLAC TROMETHAMINE 30 MG/ML IJ SOLN
30.0000 mg | Freq: Once | INTRAMUSCULAR | Status: AC
  Administered 2019-02-18: 30 mg via INTRAVENOUS
  Filled 2019-02-18: qty 1

## 2019-02-18 NOTE — Discharge Instructions (Addendum)
As discussed, we recommend further lab work to determine if you are safe to go home. Your decision to leave against medical advice puts you at risk for missed diagnosis of serious/life threatening illness. Please read instructions below. Drink clear liquids until your stomach feels better. Then, slowly introduce bland foods into your diet as tolerated, such as bread, rice, apples, bananas. You can take zofran every 8 hours as needed for nausea. Follow up with your primary care if symptoms persist. You will need to isolate at home until you know your Covid test results.  If your Covid test result is positive, you will be given a call from the hospital and instructed of further isolation precaution.

## 2019-02-18 NOTE — ED Provider Notes (Signed)
[facility] EMERGENCY DEPARTMENT Provider Note   CSN: [id number] Arrival date & time: 02/18/19  6720     History Chief Complaint  Patient presents with  . Emesis    Shawn Watkins is a 39 y.o. male w PMHx alcohol abuse, alcoholic hepatitis, N4BS, presenting to the ED with multiple complaints. Pt reports symptoms started a few days ago with nausea and vomiting. He also reports assoc body aches, cough, sore throat, nasal congestion, and productive cough. He has some mid abdominal pain as well. He states he recently began drinking alcohol again over the Christmas holiday. Last drink was yesterday morning. He reports his family members have symptoms concerning for COVID-19 virus and he is concerned he may have similiar symptoms.  The history is provided by the patient.       Past Medical History:  Diagnosis Date  . Alcoholic hepatitis ~ 9628, 07/2016, 01/2017   course of prednisolone in 07/2016  . Delirium tremens ([place]) 07/2016   also hx ETOH withdrawal seizures prior to 2018  . Depression   . ETOH abuse 07/17/2016  . Pancreatitis 2006, 2017, 07/2016   due to ETOH  . Tobacco abuse 07/17/2016  . Transaminitis 07/17/2016  . Type 2 diabetes mellitus ([place]) 02/06/2017    Patient Active Problem List   Diagnosis Date Noted  . Alcohol intoxication ([place]) 02/18/2017  . Alcohol withdrawal ([place]) 02/18/2017  . Alcohol induced acute pancreatitis 02/10/2017  . Hypokalemia 02/10/2017  . Lactic acidosis 02/10/2017  . Alcohol abuse 02/06/2017  . Severe dehydration 02/06/2017  . Fever and chills 02/06/2017  . Type 2 diabetes mellitus ([place]) 02/06/2017  . Acute alcoholic hepatitis 36/62/9476  . Alcoholic pancreatitis 54/65/0354  . Acute gallstone pancreatitis 07/17/2016  . Transaminitis 07/17/2016  . Leukocytosis 07/17/2016  . Hyponatremia 07/17/2016  . ETOH abuse 07/17/2016  . Tobacco abuse 07/17/2016    History reviewed. No pertinent surgical history.     No family history on  file.  Social History   Tobacco Use  . Smoking status: Current Every Day Smoker    Packs/day: 1.00    Types: Cigarettes    Start date: 08/04/2006  . Smokeless tobacco: Never Used  Substance Use Topics  . Alcohol use: Yes    Comment: daily  . Drug use: No    Home Medications Prior to Admission medications   Medication Sig Start Date End Date Taking? Authorizing Provider  blood glucose meter kit and supplies Dispense based on patient and insurance preference. Use up to four times daily as directed. (FOR ICD-10 E10.9, E11.9). 02/19/17   Irwin Brakeman L, MD  glimepiride (AMARYL) 2 MG tablet Take 1 tablet (2 mg total) by mouth daily with breakfast. 02/19/17 03/21/17  Irwin Brakeman L, MD  ibuprofen (ADVIL,MOTRIN) 200 MG tablet Take 400-600 mg by mouth every 6 (six) hours as needed for moderate pain.    [provider]  Multiple Vitamins-Minerals (MULTIVITAMIN) tablet Take 1 tablet by mouth daily. 02/10/17   Debbe Odea, MD  ondansetron (ZOFRAN ODT) 4 MG disintegrating tablet Take 1 tablet (4 mg total) by mouth every 8 (eight) hours as needed for nausea or vomiting. 02/18/19   Keiondra Brookover, [place] N, PA-C    Allergies    Patient has no known allergies.  Review of Systems   Review of Systems  HENT: Positive for congestion and sore throat.   Respiratory: Positive for cough. Negative for shortness of breath.   Gastrointestinal: Positive for abdominal pain, nausea and vomiting. Negative for diarrhea.  All other systems reviewed and are negative.   Physical Exam Updated Vital Signs BP 129/85   Pulse 74   Temp 98.2 F (36.8 C) (Oral)   Resp 18   Ht '6\' 1"'$  (1.854 m)   Wt 81.6 kg   SpO2 94%   BMI 23.75 kg/m   Physical Exam Vitals and nursing note reviewed.  Constitutional:      General: He is not in acute distress.    Appearance: He is well-developed. He is not ill-appearing.  HENT:     Head: Normocephalic and atraumatic.     Mouth/Throat:     Comments: White exudate  present on tongue, erythema to oral mucosa. Pharynx without swelling, uvula midline, no trismus Eyes:     Conjunctiva/sclera: Conjunctivae normal.  Cardiovascular:     Rate and Rhythm: Normal rate and regular rhythm.  Pulmonary:     Effort: Pulmonary effort is normal.     Breath sounds: Normal breath sounds.  Abdominal:     General: Bowel sounds are normal.     Palpations: Abdomen is soft.     Tenderness: There is no abdominal tenderness. There is no guarding or rebound.  Musculoskeletal:     Cervical back: Normal range of motion and neck supple. No rigidity or tenderness.  Lymphadenopathy:     Cervical: No cervical adenopathy.  Skin:    General: Skin is warm.  Neurological:     Mental Status: He is alert.  Psychiatric:        Behavior: Behavior normal.     ED Results / Procedures / Treatments   Labs (all labs ordered are listed, but only abnormal results are displayed) Labs Reviewed  COMPREHENSIVE METABOLIC PANEL - Abnormal; Notable for the following components:      Result Value   Sodium 132 (*)    Chloride 87 (*)    Glucose, Bld 306 (*)    Calcium 8.1 (*)    AST 71 (*)    ALT 49 (*)    Alkaline Phosphatase 189 (*)    Anion gap 22 (*)    All other components within normal limits  CBG MONITORING, ED - Abnormal; Notable for the following components:   Glucose-Capillary 312 (*)    All other components within normal limits  NOVEL CORONAVIRUS, NAA (HOSP ORDER, SEND-OUT TO REF LAB; TAT 18-24 HRS)  CBC WITH DIFFERENTIAL/PLATELET  LIPASE, BLOOD    EKG None  Radiology DG Chest Port 1 View  Result Date: 02/18/2019 CLINICAL DATA:  Cough. EXAM: PORTABLE CHEST 1 VIEW COMPARISON:  February 06, 2017. FINDINGS: The heart size and mediastinal contours are within normal limits. Both lungs are clear. The visualized skeletal structures are unremarkable. IMPRESSION: No active disease. Electronically Signed   By: Marijo Conception M.D.   On: 02/18/2019 10:02     Procedures Procedures (including critical care time)  Medications Ordered in ED Medications  ondansetron (ZOFRAN) injection 4 mg (4 mg Intravenous Given 02/18/19 1006)  sodium chloride 0.9 % bolus 1,000 mL (0 mLs Intravenous Stopped 02/18/19 1130)  sodium chloride 0.9 % bolus 1,000 mL (0 mLs Intravenous Stopped 02/18/19 1336)  ketorolac (TORADOL) 30 MG/ML injection 30 mg (30 mg Intravenous Given 02/18/19 1233)    ED Course  I have reviewed the triage vital signs and the nursing notes.  Pertinent labs & imaging results that were available during my care of the patient were reviewed by me and considered in my medical decision making (see chart for details).  MDM Rules/Calculators/A&P                      Shawn Watkins was evaluated in Emergency Department on 02/18/2019 for the symptoms described in the history of present illness. He was evaluated in the context of the global COVID-19 pandemic, which necessitated consideration that the patient might be at risk for infection with the SARS-CoV-2 virus that causes COVID-19. Institutional protocols and algorithms that pertain to the evaluation of patients at risk for COVID-19 are in a state of rapid change based on information released by regulatory bodies including the CDC and federal and state organizations. These policies and algorithms were followed during the patient's care in the ED.   Presenting with nausea and vomiting as well as URI symptoms that began a couple of days ago.  He had potential Covid exposure with family members with similar symptoms.  On exam he is well-appearing.  Lungs are clear, abdomen is soft and nontender.  Patient is afebrile with normal vital signs.  Labs without leukocytosis though show hyperglycemia.  Normal bicarb, however elevated anion gap of 22.  COVID test sent. He was hydrated with multiple liters of IV fluids and given symptomatic management however plan to recheck metabolic panel, VBG and lactic acid after  fluids finished, patient requesting to leave AMA.  Discussed at length the risk of leaving prior to proper diagnosis with concern for possible DKA in the setting of nausea and vomiting with hyperglycemia.  He verbalized understanding of risks of leaving including life-threatening pathology.  He discharged with symptomatic management and instructed to follow closely with his PCP.  Encouraged good p.o. hydration, return if worsening.  Patient discussed with Dr. Laverta Baltimore, who is aware of patient's decision to leave AMA.  We discussed the nature and purpose, risks and benefits, as well as, the alternatives of treatment. Time was given to allow the opportunity to ask questions and consider their options, and after the discussion, the patient decided to refuse the offerred treatment. The patient was informed that refusal could lead to, but was not limited to, death, permanent disability, or severe pain. If present, I asked the relatives or significant others to dissuade them without success. Prior to refusing, I determined that the patient had the capacity to make their decision and understood the consequences of that decision. After refusal, I made every reasonable opportunity to treat them to the best of my ability.  The patient was notified that they may return to the emergency department at any time for further treatment.     Final Clinical Impression(s) / ED Diagnoses Final diagnoses:  Hyperglycemia  Non-intractable vomiting with nausea, unspecified vomiting type  Suspected COVID-19 virus infection    Rx / DC Orders ED Discharge Orders         Ordered    ondansetron (ZOFRAN ODT) 4 MG disintegrating tablet  Every 8 hours PRN     02/18/19 1348           Raksha Wolfgang, [place] N, [place] 02/18/19 1449    Margette Fast, MD 02/18/19 1840

## 2019-02-18 NOTE — ED Notes (Signed)
PT given prescription for zofran and AMA papers. PT stated he would return if he felt any worse. PT voiced understanding of risks leaving AMA.

## 2019-02-18 NOTE — ED Triage Notes (Signed)
Pt reports is a diabetic and hasn't checked his sugar in a couple of days.  Also reports history of etoh abuse and says started drinking again 5 days ago.  Last etoh was yesterday.  Reports cough, sob, loss of taste and smell, vomiting.

## 2019-02-18 NOTE — ED Notes (Signed)
Pt also reports red rash to abd.  Denies itching.

## 2019-02-19 LAB — NOVEL CORONAVIRUS, NAA (HOSP ORDER, SEND-OUT TO REF LAB; TAT 18-24 HRS): SARS-CoV-2, NAA: DETECTED — AB

## 2019-02-20 ENCOUNTER — Telehealth (HOSPITAL_COMMUNITY): Payer: Self-pay

## 2019-02-25 ENCOUNTER — Other Ambulatory Visit: Payer: Self-pay

## 2019-02-25 DIAGNOSIS — Y907 Blood alcohol level of 200-239 mg/100 ml: Secondary | ICD-10-CM | POA: Diagnosis present

## 2019-02-25 DIAGNOSIS — U071 COVID-19: Principal | ICD-10-CM | POA: Diagnosis present

## 2019-02-25 DIAGNOSIS — I248 Other forms of acute ischemic heart disease: Secondary | ICD-10-CM | POA: Diagnosis present

## 2019-02-25 DIAGNOSIS — E860 Dehydration: Secondary | ICD-10-CM | POA: Diagnosis present

## 2019-02-25 DIAGNOSIS — R680 Hypothermia, not associated with low environmental temperature: Secondary | ICD-10-CM | POA: Diagnosis present

## 2019-02-25 DIAGNOSIS — F10220 Alcohol dependence with intoxication, uncomplicated: Secondary | ICD-10-CM | POA: Diagnosis present

## 2019-02-25 DIAGNOSIS — F10239 Alcohol dependence with withdrawal, unspecified: Secondary | ICD-10-CM | POA: Diagnosis not present

## 2019-02-25 DIAGNOSIS — F17210 Nicotine dependence, cigarettes, uncomplicated: Secondary | ICD-10-CM | POA: Diagnosis present

## 2019-02-25 DIAGNOSIS — K7290 Hepatic failure, unspecified without coma: Secondary | ICD-10-CM | POA: Diagnosis not present

## 2019-02-25 DIAGNOSIS — J9601 Acute respiratory failure with hypoxia: Secondary | ICD-10-CM | POA: Diagnosis present

## 2019-02-25 DIAGNOSIS — L89150 Pressure ulcer of sacral region, unstageable: Secondary | ICD-10-CM | POA: Diagnosis present

## 2019-02-25 DIAGNOSIS — Z794 Long term (current) use of insulin: Secondary | ICD-10-CM

## 2019-02-25 DIAGNOSIS — E1165 Type 2 diabetes mellitus with hyperglycemia: Secondary | ICD-10-CM | POA: Diagnosis present

## 2019-02-25 DIAGNOSIS — F329 Major depressive disorder, single episode, unspecified: Secondary | ICD-10-CM | POA: Diagnosis present

## 2019-02-25 DIAGNOSIS — R008 Other abnormalities of heart beat: Secondary | ICD-10-CM | POA: Diagnosis not present

## 2019-02-25 DIAGNOSIS — D6959 Other secondary thrombocytopenia: Secondary | ICD-10-CM | POA: Diagnosis present

## 2019-02-25 DIAGNOSIS — R34 Anuria and oliguria: Secondary | ICD-10-CM | POA: Diagnosis not present

## 2019-02-25 DIAGNOSIS — E1111 Type 2 diabetes mellitus with ketoacidosis with coma: Secondary | ICD-10-CM | POA: Diagnosis present

## 2019-02-25 DIAGNOSIS — J1282 Pneumonia due to coronavirus disease 2019: Secondary | ICD-10-CM | POA: Diagnosis present

## 2019-02-25 DIAGNOSIS — Z79899 Other long term (current) drug therapy: Secondary | ICD-10-CM

## 2019-02-25 DIAGNOSIS — N170 Acute kidney failure with tubular necrosis: Secondary | ICD-10-CM | POA: Diagnosis present

## 2019-02-25 DIAGNOSIS — G9341 Metabolic encephalopathy: Secondary | ICD-10-CM | POA: Diagnosis present

## 2019-02-25 DIAGNOSIS — R001 Bradycardia, unspecified: Secondary | ICD-10-CM | POA: Diagnosis present

## 2019-02-25 DIAGNOSIS — E875 Hyperkalemia: Secondary | ICD-10-CM | POA: Diagnosis present

## 2019-02-25 DIAGNOSIS — M6282 Rhabdomyolysis: Secondary | ICD-10-CM | POA: Diagnosis not present

## 2019-02-25 DIAGNOSIS — K7010 Alcoholic hepatitis without ascites: Secondary | ICD-10-CM | POA: Diagnosis present

## 2019-02-25 DIAGNOSIS — D6489 Other specified anemias: Secondary | ICD-10-CM | POA: Diagnosis present

## 2019-02-25 DIAGNOSIS — J690 Pneumonitis due to inhalation of food and vomit: Secondary | ICD-10-CM | POA: Diagnosis not present

## 2019-02-25 DIAGNOSIS — A4189 Other specified sepsis: Secondary | ICD-10-CM | POA: Clinically undetermined

## 2019-02-25 DIAGNOSIS — I9589 Other hypotension: Secondary | ICD-10-CM | POA: Diagnosis not present

## 2019-02-25 DIAGNOSIS — R6521 Severe sepsis with septic shock: Secondary | ICD-10-CM | POA: Clinically undetermined

## 2019-02-25 DIAGNOSIS — K121 Other forms of stomatitis: Secondary | ICD-10-CM | POA: Diagnosis not present

## 2019-02-25 DIAGNOSIS — E11649 Type 2 diabetes mellitus with hypoglycemia without coma: Secondary | ICD-10-CM | POA: Diagnosis not present

## 2019-02-25 DIAGNOSIS — T44995A Adverse effect of other drug primarily affecting the autonomic nervous system, initial encounter: Secondary | ICD-10-CM | POA: Diagnosis not present

## 2019-02-25 DIAGNOSIS — E861 Hypovolemia: Secondary | ICD-10-CM | POA: Diagnosis present

## 2019-02-26 ENCOUNTER — Inpatient Hospital Stay (HOSPITAL_COMMUNITY)
Admission: EM | Admit: 2019-02-26 | Discharge: 2019-03-13 | DRG: 207 | Disposition: A | Discharge status: Home / Self Care | Payer: HRSA Program | Attending: Internal Medicine | Admitting: Internal Medicine

## 2019-02-26 ENCOUNTER — Encounter: Payer: Self-pay | Admitting: Pulmonary Disease

## 2019-02-26 ENCOUNTER — Emergency Department (HOSPITAL_COMMUNITY): Payer: HRSA Program

## 2019-02-26 ENCOUNTER — Other Ambulatory Visit: Payer: Self-pay

## 2019-02-26 ENCOUNTER — Encounter (HOSPITAL_COMMUNITY): Payer: Self-pay

## 2019-02-26 ENCOUNTER — Inpatient Hospital Stay (HOSPITAL_COMMUNITY): Payer: HRSA Program

## 2019-02-26 DIAGNOSIS — E872 Acidosis, unspecified: Secondary | ICD-10-CM

## 2019-02-26 DIAGNOSIS — A419 Sepsis, unspecified organism: Secondary | ICD-10-CM

## 2019-02-26 DIAGNOSIS — M6282 Rhabdomyolysis: Secondary | ICD-10-CM | POA: Diagnosis not present

## 2019-02-26 DIAGNOSIS — J1282 Pneumonia due to coronavirus disease 2019: Secondary | ICD-10-CM | POA: Diagnosis present

## 2019-02-26 DIAGNOSIS — E1110 Type 2 diabetes mellitus with ketoacidosis without coma: Secondary | ICD-10-CM | POA: Diagnosis present

## 2019-02-26 DIAGNOSIS — Z794 Long term (current) use of insulin: Secondary | ICD-10-CM

## 2019-02-26 DIAGNOSIS — G9341 Metabolic encephalopathy: Secondary | ICD-10-CM

## 2019-02-26 DIAGNOSIS — A4189 Other specified sepsis: Secondary | ICD-10-CM | POA: Diagnosis not present

## 2019-02-26 DIAGNOSIS — Y907 Blood alcohol level of 200-239 mg/100 ml: Secondary | ICD-10-CM | POA: Diagnosis present

## 2019-02-26 DIAGNOSIS — I248 Other forms of acute ischemic heart disease: Secondary | ICD-10-CM | POA: Diagnosis present

## 2019-02-26 DIAGNOSIS — J189 Pneumonia, unspecified organism: Secondary | ICD-10-CM

## 2019-02-26 DIAGNOSIS — E11649 Type 2 diabetes mellitus with hypoglycemia without coma: Secondary | ICD-10-CM | POA: Diagnosis not present

## 2019-02-26 DIAGNOSIS — J9601 Acute respiratory failure with hypoxia: Secondary | ICD-10-CM

## 2019-02-26 DIAGNOSIS — F10239 Alcohol dependence with withdrawal, unspecified: Secondary | ICD-10-CM | POA: Diagnosis not present

## 2019-02-26 DIAGNOSIS — E860 Dehydration: Secondary | ICD-10-CM

## 2019-02-26 DIAGNOSIS — U071 COVID-19: Secondary | ICD-10-CM

## 2019-02-26 DIAGNOSIS — E0811 Diabetes mellitus due to underlying condition with ketoacidosis with coma: Secondary | ICD-10-CM

## 2019-02-26 DIAGNOSIS — F10920 Alcohol use, unspecified with intoxication, uncomplicated: Secondary | ICD-10-CM

## 2019-02-26 DIAGNOSIS — R402432 Glasgow coma scale score 3-8, at arrival to emergency department: Secondary | ICD-10-CM

## 2019-02-26 DIAGNOSIS — E1111 Type 2 diabetes mellitus with ketoacidosis with coma: Secondary | ICD-10-CM | POA: Diagnosis present

## 2019-02-26 DIAGNOSIS — I9589 Other hypotension: Secondary | ICD-10-CM

## 2019-02-26 DIAGNOSIS — N179 Acute kidney failure, unspecified: Secondary | ICD-10-CM

## 2019-02-26 DIAGNOSIS — R6521 Severe sepsis with septic shock: Secondary | ICD-10-CM

## 2019-02-26 DIAGNOSIS — N170 Acute kidney failure with tubular necrosis: Secondary | ICD-10-CM | POA: Diagnosis present

## 2019-02-26 DIAGNOSIS — E119 Type 2 diabetes mellitus without complications: Secondary | ICD-10-CM

## 2019-02-26 DIAGNOSIS — R945 Abnormal results of liver function studies: Secondary | ICD-10-CM

## 2019-02-26 DIAGNOSIS — E1165 Type 2 diabetes mellitus with hyperglycemia: Secondary | ICD-10-CM | POA: Diagnosis present

## 2019-02-26 DIAGNOSIS — J690 Pneumonitis due to inhalation of food and vomit: Secondary | ICD-10-CM | POA: Diagnosis not present

## 2019-02-26 DIAGNOSIS — Z452 Encounter for adjustment and management of vascular access device: Secondary | ICD-10-CM

## 2019-02-26 DIAGNOSIS — T68XXXA Hypothermia, initial encounter: Secondary | ICD-10-CM

## 2019-02-26 DIAGNOSIS — R001 Bradycardia, unspecified: Secondary | ICD-10-CM | POA: Diagnosis present

## 2019-02-26 DIAGNOSIS — E861 Hypovolemia: Secondary | ICD-10-CM

## 2019-02-26 DIAGNOSIS — E875 Hyperkalemia: Secondary | ICD-10-CM | POA: Diagnosis present

## 2019-02-26 DIAGNOSIS — L8990 Pressure ulcer of unspecified site, unspecified stage: Secondary | ICD-10-CM | POA: Insufficient documentation

## 2019-02-26 DIAGNOSIS — F10220 Alcohol dependence with intoxication, uncomplicated: Secondary | ICD-10-CM | POA: Diagnosis present

## 2019-02-26 DIAGNOSIS — R7989 Other specified abnormal findings of blood chemistry: Secondary | ICD-10-CM

## 2019-02-26 DIAGNOSIS — D6959 Other secondary thrombocytopenia: Secondary | ICD-10-CM | POA: Diagnosis present

## 2019-02-26 DIAGNOSIS — F1010 Alcohol abuse, uncomplicated: Secondary | ICD-10-CM | POA: Diagnosis present

## 2019-02-26 DIAGNOSIS — K121 Other forms of stomatitis: Secondary | ICD-10-CM | POA: Diagnosis not present

## 2019-02-26 DIAGNOSIS — K7010 Alcoholic hepatitis without ascites: Secondary | ICD-10-CM | POA: Diagnosis present

## 2019-02-26 DIAGNOSIS — R34 Anuria and oliguria: Secondary | ICD-10-CM | POA: Diagnosis not present

## 2019-02-26 DIAGNOSIS — D6489 Other specified anemias: Secondary | ICD-10-CM | POA: Diagnosis present

## 2019-02-26 LAB — BLOOD GAS, VENOUS
FIO2: 50
O2 Saturation: 80.4 %
Patient temperature: 37
pCO2, Ven: 27 mmHg — ABNORMAL LOW (ref 44.0–60.0)
pH, Ven: <6.8 — CL (ref 7.250–7.430)
pO2, Ven: 92.3 mmHg — ABNORMAL HIGH (ref 32.0–45.0)

## 2019-02-26 LAB — BASIC METABOLIC PANEL
Anion gap: 21 — ABNORMAL HIGH (ref 5–15)
Anion gap: 21 — ABNORMAL HIGH (ref 5–15)
BUN: 30 mg/dL — ABNORMAL HIGH (ref 6–20)
BUN: 37 mg/dL — ABNORMAL HIGH (ref 6–20)
BUN: 39 mg/dL — ABNORMAL HIGH (ref 6–20)
CO2: <7 mmol/L — ABNORMAL LOW (ref 22–32)
CO2: 25 mmol/L (ref 22–32)
CO2: 25 mmol/L (ref 22–32)
Calcium: 6.6 mg/dL — ABNORMAL LOW (ref 8.9–10.3)
Calcium: 6.6 mg/dL — ABNORMAL LOW (ref 8.9–10.3)
Calcium: 6.6 mg/dL — ABNORMAL LOW (ref 8.9–10.3)
Chloride: 92 mmol/L — ABNORMAL LOW (ref 98–111)
Chloride: 96 mmol/L — ABNORMAL LOW (ref 98–111)
Chloride: 97 mmol/L — ABNORMAL LOW (ref 98–111)
Creatinine, Ser: 2.97 mg/dL — ABNORMAL HIGH (ref 0.61–1.24)
Creatinine, Ser: 3.55 mg/dL — ABNORMAL HIGH (ref 0.61–1.24)
Creatinine, Ser: 3.72 mg/dL — ABNORMAL HIGH (ref 0.61–1.24)
GFR calc Af Amer: 30 mL/min — ABNORMAL LOW (ref >60)
GFR calc Af Amer: 24 mL/min — ABNORMAL LOW (ref >60)
GFR calc Af Amer: 23 mL/min — ABNORMAL LOW (ref >60)
GFR calc non Af Amer: 26 mL/min — ABNORMAL LOW (ref >60)
GFR calc non Af Amer: 21 mL/min — ABNORMAL LOW (ref >60)
GFR calc non Af Amer: 20 mL/min — ABNORMAL LOW (ref >60)
Glucose, Bld: 372 mg/dL — ABNORMAL HIGH (ref 70–99)
Glucose, Bld: 231 mg/dL — ABNORMAL HIGH (ref 70–99)
Glucose, Bld: 141 mg/dL — ABNORMAL HIGH (ref 70–99)
Potassium: 5.6 mmol/L — ABNORMAL HIGH (ref 3.5–5.1)
Potassium: 3.6 mmol/L (ref 3.5–5.1)
Potassium: 3.4 mmol/L — ABNORMAL LOW (ref 3.5–5.1)
Sodium: 137 mmol/L (ref 135–145)
Sodium: 142 mmol/L (ref 135–145)
Sodium: 143 mmol/L (ref 135–145)

## 2019-02-26 LAB — CBG MONITORING, ED
Glucose-Capillary: 424 mg/dL — ABNORMAL HIGH (ref 70–99)
Glucose-Capillary: 552 mg/dL (ref 70–99)
Glucose-Capillary: 403 mg/dL — ABNORMAL HIGH (ref 70–99)
Glucose-Capillary: 426 mg/dL — ABNORMAL HIGH (ref 70–99)
Glucose-Capillary: 390 mg/dL — ABNORMAL HIGH (ref 70–99)
Glucose-Capillary: 296 mg/dL — ABNORMAL HIGH (ref 70–99)
Glucose-Capillary: 323 mg/dL — ABNORMAL HIGH (ref 70–99)
Glucose-Capillary: 261 mg/dL — ABNORMAL HIGH (ref 70–99)
Glucose-Capillary: 237 mg/dL — ABNORMAL HIGH (ref 70–99)

## 2019-02-26 LAB — CBC WITH DIFFERENTIAL/PLATELET
Abs Immature Granulocytes: 1.05 10*3/uL — ABNORMAL HIGH (ref 0.00–0.07)
Basophils Absolute: 0 10*3/uL (ref 0.0–0.1)
Basophils Relative: 0 %
Eosinophils Absolute: 0.6 10*3/uL — ABNORMAL HIGH (ref 0.0–0.5)
Eosinophils Relative: 6 %
HCT: 47.5 % (ref 39.0–52.0)
Hemoglobin: 14.3 g/dL (ref 13.0–17.0)
Immature Granulocytes: 10 %
Lymphocytes Relative: 25 %
Lymphs Abs: 2.6 10*3/uL (ref 0.7–4.0)
MCH: 29.3 pg (ref 26.0–34.0)
MCHC: 30.1 g/dL (ref 30.0–36.0)
MCV: 97.3 fL (ref 80.0–100.0)
Monocytes Absolute: 0.7 10*3/uL (ref 0.1–1.0)
Monocytes Relative: 7 %
Neutro Abs: 5.3 10*3/uL (ref 1.7–7.7)
Neutrophils Relative %: 52 %
Platelets: 174 10*3/uL (ref 150–400)
RBC: 4.88 MIL/uL (ref 4.22–5.81)
RDW: 13 % (ref 11.5–15.5)
WBC: 10.3 10*3/uL (ref 4.0–10.5)
nRBC: 0 % (ref 0.0–0.2)

## 2019-02-26 LAB — COMPREHENSIVE METABOLIC PANEL
ALT: 150 U/L — ABNORMAL HIGH (ref 0–44)
AST: 504 U/L — ABNORMAL HIGH (ref 15–41)
Albumin: 3.2 g/dL — ABNORMAL LOW (ref 3.5–5.0)
Alkaline Phosphatase: 197 U/L — ABNORMAL HIGH (ref 38–126)
Anion gap: 41 — ABNORMAL HIGH (ref 5–15)
BUN: 26 mg/dL — ABNORMAL HIGH (ref 6–20)
CO2: 4 mmol/L — ABNORMAL LOW (ref 22–32)
Calcium: 7.1 mg/dL — ABNORMAL LOW (ref 8.9–10.3)
Chloride: 90 mmol/L — ABNORMAL LOW (ref 98–111)
Creatinine, Ser: 2.18 mg/dL — ABNORMAL HIGH (ref 0.61–1.24)
GFR calc Af Amer: 43 mL/min — ABNORMAL LOW (ref >60)
GFR calc non Af Amer: 37 mL/min — ABNORMAL LOW (ref >60)
Glucose, Bld: 528 mg/dL (ref 70–99)
Potassium: 4.9 mmol/L (ref 3.5–5.1)
Sodium: 135 mmol/L (ref 135–145)
Total Bilirubin: 1.4 mg/dL — ABNORMAL HIGH (ref 0.3–1.2)
Total Protein: 6.3 g/dL — ABNORMAL LOW (ref 6.5–8.1)

## 2019-02-26 LAB — BLOOD GAS, ARTERIAL
FIO2: 50
O2 Saturation: 94.7 %
Patient temperature: 37.2
pCO2 arterial: <19 mmHg — CL (ref 32.0–48.0)
pH, Arterial: <6.8 — CL (ref 7.350–7.450)
pO2, Arterial: 270 mmHg — ABNORMAL HIGH (ref 83.0–108.0)

## 2019-02-26 LAB — BETA-HYDROXYBUTYRIC ACID
Beta-Hydroxybutyric Acid: 8.71 mmol/L — ABNORMAL HIGH (ref 0.05–0.27)
Beta-Hydroxybutyric Acid: 6.26 mmol/L — ABNORMAL HIGH (ref 0.05–0.27)

## 2019-02-26 LAB — LACTATE DEHYDROGENASE: LDH: 336 U/L — ABNORMAL HIGH (ref 98–192)

## 2019-02-26 LAB — POCT I-STAT 7, (LYTES, BLD GAS, ICA,H+H)
Acid-base deficit: 15 mmol/L — ABNORMAL HIGH (ref 0.0–2.0)
Bicarbonate: 11.2 mmol/L — ABNORMAL LOW (ref 20.0–28.0)
Calcium, Ion: 0.85 mmol/L — CL (ref 1.15–1.40)
HCT: 36 % — ABNORMAL LOW (ref 39.0–52.0)
Hemoglobin: 12.2 g/dL — ABNORMAL LOW (ref 13.0–17.0)
O2 Saturation: 99 %
Patient temperature: 102.8
Potassium: 3.6 mmol/L (ref 3.5–5.1)
Sodium: 138 mmol/L (ref 135–145)
TCO2: 12 mmol/L — ABNORMAL LOW (ref 22–32)
pCO2 arterial: 29.9 mmHg — ABNORMAL LOW (ref 32.0–48.0)
pH, Arterial: 7.194 — CL (ref 7.350–7.450)
pO2, Arterial: 173 mmHg — ABNORMAL HIGH (ref 83.0–108.0)

## 2019-02-26 LAB — URINALYSIS, ROUTINE W REFLEX MICROSCOPIC
Bilirubin Urine: NEGATIVE
Glucose, UA: >=500 mg/dL — AB
Ketones, ur: 20 mg/dL — AB
Leukocytes,Ua: NEGATIVE
Nitrite: NEGATIVE
Protein, ur: 100 mg/dL — AB
Specific Gravity, Urine: 1.022 (ref 1.005–1.030)
pH: 5 (ref 5.0–8.0)

## 2019-02-26 LAB — D-DIMER, QUANTITATIVE: D-Dimer, Quant: 5.14 ug/mL-FEU — ABNORMAL HIGH (ref 0.00–0.50)

## 2019-02-26 LAB — ECHOCARDIOGRAM COMPLETE
Height: 73 in
Weight: 2878.33 oz

## 2019-02-26 LAB — GLUCOSE, CAPILLARY
Glucose-Capillary: 217 mg/dL — ABNORMAL HIGH (ref 70–99)
Glucose-Capillary: 274 mg/dL — ABNORMAL HIGH (ref 70–99)
Glucose-Capillary: 268 mg/dL — ABNORMAL HIGH (ref 70–99)
Glucose-Capillary: 268 mg/dL — ABNORMAL HIGH (ref 70–99)
Glucose-Capillary: 216 mg/dL — ABNORMAL HIGH (ref 70–99)
Glucose-Capillary: 211 mg/dL — ABNORMAL HIGH (ref 70–99)
Glucose-Capillary: 197 mg/dL — ABNORMAL HIGH (ref 70–99)
Glucose-Capillary: 150 mg/dL — ABNORMAL HIGH (ref 70–99)
Glucose-Capillary: 131 mg/dL — ABNORMAL HIGH (ref 70–99)
Glucose-Capillary: 102 mg/dL — ABNORMAL HIGH (ref 70–99)
Glucose-Capillary: 106 mg/dL — ABNORMAL HIGH (ref 70–99)

## 2019-02-26 LAB — LACTIC ACID, PLASMA
Lactic Acid, Venous: >11 mmol/L (ref 0.5–1.9)
Lactic Acid, Venous: >11 mmol/L (ref 0.5–1.9)
Lactic Acid, Venous: >11 mmol/L (ref 0.5–1.9)

## 2019-02-26 LAB — FIBRINOGEN: Fibrinogen: 230 mg/dL (ref 210–475)

## 2019-02-26 LAB — TROPONIN I (HIGH SENSITIVITY)
Troponin I (High Sensitivity): 19 ng/L — ABNORMAL HIGH (ref <18)
Troponin I (High Sensitivity): 37 ng/L — ABNORMAL HIGH (ref <18)

## 2019-02-26 LAB — TRIGLYCERIDES: Triglycerides: 586 mg/dL — ABNORMAL HIGH (ref <150)

## 2019-02-26 LAB — C-REACTIVE PROTEIN: CRP: 2.2 mg/dL — ABNORMAL HIGH (ref <1.0)

## 2019-02-26 LAB — RAPID URINE DRUG SCREEN, HOSP PERFORMED
Amphetamines: NOT DETECTED
Barbiturates: NOT DETECTED
Benzodiazepines: POSITIVE — AB
Cocaine: NOT DETECTED
Opiates: NOT DETECTED
Tetrahydrocannabinol: NOT DETECTED

## 2019-02-26 LAB — LIPASE, BLOOD: Lipase: 55 U/L — ABNORMAL HIGH (ref 11–51)

## 2019-02-26 LAB — PROCALCITONIN: Procalcitonin: 0.71 ng/mL

## 2019-02-26 LAB — FERRITIN: Ferritin: 750 ng/mL — ABNORMAL HIGH (ref 24–336)

## 2019-02-26 LAB — MAGNESIUM: Magnesium: 2.9 mg/dL — ABNORMAL HIGH (ref 1.7–2.4)

## 2019-02-26 LAB — ETHANOL: Alcohol, Ethyl (B): 215 mg/dL — ABNORMAL HIGH (ref <10)

## 2019-02-26 LAB — CORTISOL: Cortisol, Plasma: 42.2 ug/dL

## 2019-02-26 MED ORDER — SODIUM CHLORIDE 0.9 % IV BOLUS
1000.0000 mL | Freq: Once | INTRAVENOUS | Status: AC
  Administered 2019-02-26: 1000 mL via INTRAVENOUS

## 2019-02-26 MED ORDER — MIDAZOLAM HCL 2 MG/2ML IJ SOLN
2.0000 mg | INTRAMUSCULAR | Status: AC | PRN
  Administered 2019-02-26 (×3): 2 mg via INTRAVENOUS
  Filled 2019-02-26 (×3): qty 2

## 2019-02-26 MED ORDER — ACETAMINOPHEN 650 MG RE SUPP
650.0000 mg | Freq: Once | RECTAL | Status: AC
  Administered 2019-02-26: 650 mg via RECTAL
  Filled 2019-02-26: qty 1

## 2019-02-26 MED ORDER — NOREPINEPHRINE 16 MG/250ML-% IV SOLN
0.0000 ug/min | INTRAVENOUS | Status: DC
  Administered 2019-02-26: 25 ug/min via INTRAVENOUS
  Administered 2019-02-26 (×2): 33 ug/min via INTRAVENOUS
  Administered 2019-02-26: 24 ug/min via INTRAVENOUS
  Administered 2019-02-27: 18 ug/min via INTRAVENOUS
  Administered 2019-02-27: 34 ug/min via INTRAVENOUS
  Administered 2019-02-27: 40 ug/min via INTRAVENOUS
  Administered 2019-02-28: 35 ug/min via INTRAVENOUS
  Administered 2019-02-28: 18:00:00 19 ug/min via INTRAVENOUS
  Administered 2019-02-28: 40 ug/min via INTRAVENOUS
  Administered 2019-03-01: 15 ug/min via INTRAVENOUS
  Administered 2019-03-02: 6 ug/min via INTRAVENOUS
  Filled 2019-02-26 (×12): qty 250

## 2019-02-26 MED ORDER — SUCCINYLCHOLINE CHLORIDE 20 MG/ML IJ SOLN
125.0000 mg | Freq: Once | INTRAMUSCULAR | Status: AC
  Administered 2019-02-26: 125 mg via INTRAVENOUS

## 2019-02-26 MED ORDER — VANCOMYCIN HCL IN DEXTROSE 1-5 GM/200ML-% IV SOLN: 1000.0000 mg | Freq: Once | INTRAVENOUS | Status: DC

## 2019-02-26 MED ORDER — SODIUM BICARBONATE 8.4 % IV SOLN
50.0000 meq | Freq: Once | INTRAVENOUS | Status: AC
  Administered 2019-02-26: 50 meq via INTRAVENOUS

## 2019-02-26 MED ORDER — SODIUM CHLORIDE 0.9 % IV BOLUS
500.0000 mL | Freq: Once | INTRAVENOUS | Status: AC
  Administered 2019-02-26: 500 mL via INTRAVENOUS

## 2019-02-26 MED ORDER — SODIUM CHLORIDE 0.9 % IV SOLN: INTRAVENOUS | Status: AC

## 2019-02-26 MED ORDER — ETOMIDATE 2 MG/ML IV SOLN
10.0000 mg | Freq: Once | INTRAVENOUS | Status: AC
  Administered 2019-02-26: 10 mg via INTRAVENOUS

## 2019-02-26 MED ORDER — LACTATED RINGERS IV BOLUS
2000.0000 mL | Freq: Once | INTRAVENOUS | Status: AC
  Administered 2019-02-26: 2000 mL via INTRAVENOUS

## 2019-02-26 MED ORDER — DEXTROSE-NACL 5-0.45 % IV SOLN
INTRAVENOUS | Status: DC
  Administered 2019-02-26: 75 mL/h via INTRAVENOUS

## 2019-02-26 MED ORDER — PANTOPRAZOLE SODIUM 40 MG IV SOLR: 40.0000 mg | Freq: Every day | INTRAVENOUS | Status: DC

## 2019-02-26 MED ORDER — DEXMEDETOMIDINE HCL IN NACL 400 MCG/100ML IV SOLN
0.0000 ug/kg/h | INTRAVENOUS | Status: DC
  Administered 2019-02-26: 0.4 ug/kg/h via INTRAVENOUS
  Filled 2019-02-26 (×2): qty 100

## 2019-02-26 MED ORDER — MIDAZOLAM 50MG/50ML (1MG/ML) PREMIX INFUSION
0.5000 mg/h | INTRAVENOUS | Status: DC
  Administered 2019-02-26: 2 mg/h via INTRAVENOUS
  Administered 2019-02-26 (×2): 0.5 mg/h via INTRAVENOUS
  Filled 2019-02-26 (×3): qty 50

## 2019-02-26 MED ORDER — CALCIUM GLUCONATE-NACL 1-0.675 GM/50ML-% IV SOLN
1.0000 g | Freq: Once | INTRAVENOUS | Status: AC
  Administered 2019-02-26: 1000 mg via INTRAVENOUS
  Filled 2019-02-26: qty 50

## 2019-02-26 MED ORDER — MIDAZOLAM 50MG/50ML (1MG/ML) PREMIX INFUSION
0.0000 mg/h | INTRAVENOUS | Status: DC
  Administered 2019-02-26: 7.5 mg/h via INTRAVENOUS
  Administered 2019-02-26: 0.5 mg/h via INTRAVENOUS
  Administered 2019-02-27 (×4): 8 mg/h via INTRAVENOUS
  Administered 2019-02-28: 5 mg/h via INTRAVENOUS
  Administered 2019-02-28: 8 mg/h via INTRAVENOUS
  Filled 2019-02-26 (×8): qty 50

## 2019-02-26 MED ORDER — CHLORHEXIDINE GLUCONATE CLOTH 2 % EX PADS
6.0000 | MEDICATED_PAD | Freq: Every day | CUTANEOUS | Status: DC
  Administered 2019-02-26 – 2019-03-12 (×14): 6 via TOPICAL

## 2019-02-26 MED ORDER — PANTOPRAZOLE SODIUM 40 MG PO PACK
40.0000 mg | PACK | Freq: Every day | ORAL | Status: DC
  Filled 2019-02-26: qty 20

## 2019-02-26 MED ORDER — SODIUM BICARBONATE 8.4 % IV SOLN
INTRAVENOUS | Status: AC
  Filled 2019-02-26: qty 150

## 2019-02-26 MED ORDER — DOCUSATE SODIUM 50 MG/5ML PO LIQD
100.0000 mg | Freq: Two times a day (BID) | ORAL | Status: DC | PRN
  Filled 2019-02-26: qty 10

## 2019-02-26 MED ORDER — PANTOPRAZOLE SODIUM 40 MG IV SOLR
40.0000 mg | Freq: Two times a day (BID) | INTRAVENOUS | Status: DC
  Administered 2019-02-26 – 2019-03-04 (×12): 40 mg via INTRAVENOUS
  Filled 2019-02-26 (×12): qty 40

## 2019-02-26 MED ORDER — VANCOMYCIN HCL 1750 MG/350ML IV SOLN
1750.0000 mg | Freq: Two times a day (BID) | INTRAVENOUS | Status: DC
  Administered 2019-02-26: 1750 mg via INTRAVENOUS
  Filled 2019-02-26 (×2): qty 350

## 2019-02-26 MED ORDER — LACTATED RINGERS IV BOLUS
1000.0000 mL | Freq: Once | INTRAVENOUS | Status: AC
  Administered 2019-02-26: 1000 mL via INTRAVENOUS

## 2019-02-26 MED ORDER — FENTANYL 2500MCG IN NS 250ML (10MCG/ML) PREMIX INFUSION
50.0000 ug/h | INTRAVENOUS | Status: DC
  Administered 2019-02-26: 50 ug/h via INTRAVENOUS
  Administered 2019-02-27 – 2019-02-28 (×2): 100 ug/h via INTRAVENOUS
  Filled 2019-02-26 (×3): qty 250

## 2019-02-26 MED ORDER — VASOPRESSIN 20 UNIT/ML IV SOLN
0.0300 [IU]/min | INTRAVENOUS | Status: DC
  Administered 2019-02-26 – 2019-02-28 (×3): 0.03 [IU]/min via INTRAVENOUS
  Filled 2019-02-26 (×5): qty 2

## 2019-02-26 MED ORDER — DEXTROSE 50 % IV SOLN
0.0000 mL | INTRAVENOUS | Status: DC | PRN
  Administered 2019-02-28: 50 mL via INTRAVENOUS
  Filled 2019-02-26: qty 50

## 2019-02-26 MED ORDER — STERILE WATER FOR INJECTION IV SOLN
Freq: Once | INTRAVENOUS | Status: DC
  Filled 2019-02-26: qty 850

## 2019-02-26 MED ORDER — HEPARIN SODIUM (PORCINE) 5000 UNIT/ML IJ SOLN
5000.0000 [IU] | Freq: Three times a day (TID) | INTRAMUSCULAR | Status: DC
  Administered 2019-02-26 – 2019-02-27 (×4): 5000 [IU] via SUBCUTANEOUS
  Filled 2019-02-26 (×4): qty 1

## 2019-02-26 MED ORDER — NOREPINEPHRINE 4 MG/250ML-% IV SOLN
0.0000 ug/min | INTRAVENOUS | Status: DC
  Administered 2019-02-26: 2 ug/min via INTRAVENOUS
  Filled 2019-02-26 (×2): qty 250

## 2019-02-26 MED ORDER — PHENYLEPHRINE CONCENTRATED 100MG/250ML (0.4 MG/ML) INFUSION SIMPLE
0.0000 ug/min | INTRAVENOUS | Status: DC
  Administered 2019-02-26 (×3): 400 ug/min via INTRAVENOUS
  Administered 2019-02-27: 20 ug/min via INTRAVENOUS
  Filled 2019-02-26 (×2): qty 250
  Filled 2019-02-26: qty 500
  Filled 2019-02-26: qty 250

## 2019-02-26 MED ORDER — CHLORHEXIDINE GLUCONATE 0.12% ORAL RINSE (MEDLINE KIT)
15.0000 mL | Freq: Two times a day (BID) | OROMUCOSAL | Status: DC
  Administered 2019-02-26 – 2019-03-10 (×23): 15 mL via OROMUCOSAL

## 2019-02-26 MED ORDER — SODIUM CHLORIDE 0.9 % IV SOLN: 2.0000 g | Freq: Three times a day (TID) | INTRAVENOUS | Status: DC

## 2019-02-26 MED ORDER — VANCOMYCIN VARIABLE DOSE PER UNSTABLE RENAL FUNCTION (PHARMACIST DOSING): Status: DC

## 2019-02-26 MED ORDER — DEXTROSE 50 % IV SOLN: 0.0000 mL | INTRAVENOUS | Status: DC | PRN

## 2019-02-26 MED ORDER — DEXAMETHASONE SODIUM PHOSPHATE 10 MG/ML IJ SOLN
6.0000 mg | INTRAMUSCULAR | Status: DC
  Administered 2019-02-26 – 2019-03-01 (×4): 6 mg via INTRAVENOUS
  Filled 2019-02-26 (×4): qty 1

## 2019-02-26 MED ORDER — VANCOMYCIN HCL 1500 MG/300ML IV SOLN: 1500.0000 mg | Freq: Once | INTRAVENOUS | Status: DC

## 2019-02-26 MED ORDER — SODIUM CHLORIDE 0.9 % IV SOLN: INTRAVENOUS | Status: DC

## 2019-02-26 MED ORDER — SODIUM CHLORIDE 0.9 % IV SOLN
2.0000 g | Freq: Two times a day (BID) | INTRAVENOUS | Status: DC
  Administered 2019-02-26 – 2019-02-27 (×3): 2 g via INTRAVENOUS
  Filled 2019-02-26 (×3): qty 2

## 2019-02-26 MED ORDER — FENTANYL CITRATE (PF) 100 MCG/2ML IJ SOLN
50.0000 ug | Freq: Once | INTRAMUSCULAR | Status: AC
  Administered 2019-02-26: 50 ug via INTRAVENOUS
  Filled 2019-02-26: qty 2

## 2019-02-26 MED ORDER — MIDAZOLAM HCL 2 MG/2ML IJ SOLN
2.0000 mg | INTRAMUSCULAR | Status: DC | PRN
  Administered 2019-02-26 – 2019-03-01 (×2): 2 mg via INTRAVENOUS
  Filled 2019-02-26 (×2): qty 2

## 2019-02-26 MED ORDER — DEXTROSE-NACL 5-0.45 % IV SOLN: INTRAVENOUS | Status: DC

## 2019-02-26 MED ORDER — METRONIDAZOLE IN NACL 5-0.79 MG/ML-% IV SOLN
500.0000 mg | Freq: Once | INTRAVENOUS | Status: AC
  Administered 2019-02-26: 500 mg via INTRAVENOUS
  Filled 2019-02-26: qty 100

## 2019-02-26 MED ORDER — STERILE WATER FOR INJECTION IV SOLN
INTRAVENOUS | Status: DC
  Filled 2019-02-26 (×7): qty 850

## 2019-02-26 MED ORDER — INSULIN REGULAR(HUMAN) IN NACL 100-0.9 UT/100ML-% IV SOLN
INTRAVENOUS | Status: DC
  Administered 2019-02-26: 18 [IU]/h via INTRAVENOUS
  Administered 2019-02-26: 30 [IU]/h via INTRAVENOUS
  Administered 2019-02-27: 01:00:00 1.8 [IU]/h via INTRAVENOUS
  Filled 2019-02-26 (×3): qty 100

## 2019-02-26 MED ORDER — ACETAMINOPHEN 325 MG PO TABS
650.0000 mg | ORAL_TABLET | ORAL | Status: DC | PRN
  Administered 2019-02-26 – 2019-03-13 (×13): 650 mg via ORAL
  Filled 2019-02-26 (×13): qty 2

## 2019-02-26 MED ORDER — SODIUM BICARBONATE 8.4 % IV SOLN
INTRAVENOUS | Status: AC
  Filled 2019-02-26: qty 50

## 2019-02-26 MED ORDER — MIDAZOLAM HCL 2 MG/2ML IJ SOLN: 2.0000 mg | INTRAMUSCULAR | Status: DC | PRN

## 2019-02-26 MED ORDER — ONDANSETRON HCL 4 MG/2ML IJ SOLN
4.0000 mg | Freq: Four times a day (QID) | INTRAMUSCULAR | Status: DC | PRN
  Administered 2019-03-04 (×2): 4 mg via INTRAVENOUS
  Filled 2019-02-26 (×3): qty 2

## 2019-02-26 MED ORDER — STERILE WATER FOR INJECTION IV SOLN
Freq: Once | INTRAVENOUS | Status: AC
  Filled 2019-02-26: qty 850

## 2019-02-26 MED ORDER — PHENYLEPHRINE HCL-NACL 10-0.9 MG/250ML-% IV SOLN
0.0000 ug/min | INTRAVENOUS | Status: DC
  Administered 2019-02-26: 400 ug/min via INTRAVENOUS
  Administered 2019-02-26 (×2): 20 ug/min via INTRAVENOUS
  Filled 2019-02-26 (×9): qty 250

## 2019-02-26 MED ORDER — SODIUM CHLORIDE 0.9 % IV SOLN
2.0000 g | Freq: Once | INTRAVENOUS | Status: AC
  Administered 2019-02-26: 2 g via INTRAVENOUS
  Filled 2019-02-26: qty 2

## 2019-02-26 MED ORDER — ORAL CARE MOUTH RINSE
15.0000 mL | OROMUCOSAL | Status: DC
  Administered 2019-02-26 – 2019-03-10 (×97): 15 mL via OROMUCOSAL

## 2019-02-26 MED ORDER — INSULIN REGULAR(HUMAN) IN NACL 100-0.9 UT/100ML-% IV SOLN
INTRAVENOUS | Status: DC
  Administered 2019-02-26: 17 [IU]/h via INTRAVENOUS
  Filled 2019-02-26 (×2): qty 100

## 2019-02-26 MED ORDER — FENTANYL BOLUS VIA INFUSION
50.0000 ug | INTRAVENOUS | Status: DC | PRN
  Administered 2019-02-26 (×2): 50 ug via INTRAVENOUS
  Filled 2019-02-26: qty 50

## 2019-02-26 NOTE — ED Notes (Addendum)
Patient still on pressure control -- tried changing to [facility] . Patient still breathing over vent trying to set body PH. VT 1100 or more. VE 27 . Patient on versed drip and drops pressure with increase. BP is 91/37. Saturation is 100. PH of earlier gas is below reportable limits. PCO2 27.0 , PO2 92.3 .  Patient has been started on Bicarb drip.

## 2019-02-26 NOTE — Progress Notes (Signed)
eLink Physician-Brief Progress Note Patient Name: Shawn Watkins DOB: February 13, 1981 MRN: [id number]   Date of Service  02/26/2019  HPI/Events of Note  DKA and COVID-19 patient with lactate reported at 11, per bedside RN abdomen is soft, no obvious explanation for marked lactate elevation  eICU Interventions  Will re-check lactate, check ABG, give a fluid bolus. If Lactate level is confirmed will order a CT abdomen/ pelvis to exclude an acute abdomen.        [name] Sunny Aguon 02/26/2019, 11:47 PM

## 2019-02-26 NOTE — ED Notes (Signed)
CRITICAL VALUE ALERT  Critical Value: Lactic Acid >11 Date & Time Notied:  02/26/19 @ 0620 Provider Notified: Dr Lars Mage Orders Received/Actions taken: None yet

## 2019-02-26 NOTE — ED Triage Notes (Addendum)
Pt in lobby, slumped back in WC, not alert, non-verbal, pt taken to room #5, pt has shallow breathing and responds to pain, large pupils noted. Pale in color, bradycardic -Dr Lynelle Doctor at bedside.

## 2019-02-26 NOTE — Plan of Care (Signed)
  Problem: Education: Goal: Knowledge of risk factors and measures for prevention of condition will improve Outcome: Not Progressing   Problem: Coping: Goal: Psychosocial and spiritual needs will be supported Outcome: Not Progressing   Problem: Respiratory: Goal: Will maintain a patent airway Outcome: Not Progressing Goal: Complications related to the disease process, condition or treatment will be avoided or minimized Outcome: Not Progressing   Problem: Education: Goal: Ability to describe self-care measures that may prevent or decrease complications (Diabetes Survival Skills Education) will improve Outcome: Not Progressing Goal: Individualized Educational Video(s) Outcome: Not Progressing   Problem: Coping: Goal: Ability to adjust to condition or change in health will improve Outcome: Not Progressing   Problem: Fluid Volume: Goal: Ability to maintain a balanced intake and output will improve Outcome: Not Progressing   Problem: Health Behavior/Discharge Planning: Goal: Ability to identify and utilize available resources and services will improve Outcome: Not Progressing Goal: Ability to manage health-related needs will improve Outcome: Not Progressing   Problem: Metabolic: Goal: Ability to maintain appropriate glucose levels will improve Outcome: Not Progressing   Problem: Nutritional: Goal: Maintenance of adequate nutrition will improve Outcome: Not Progressing Goal: Progress toward achieving an optimal weight will improve Outcome: Not Progressing   Problem: Skin Integrity: Goal: Risk for impaired skin integrity will decrease Outcome: Not Progressing   Problem: Tissue Perfusion: Goal: Adequacy of tissue perfusion will improve Outcome: Not Progressing   Problem: Activity: Goal: Ability to tolerate increased activity will improve Outcome: Not Progressing   Problem: Respiratory: Goal: Ability to maintain a clear airway and adequate ventilation will  improve Outcome: Not Progressing   Problem: Role Relationship: Goal: Method of communication will improve Outcome: Not Progressing

## 2019-02-26 NOTE — H&P (Signed)
NAME:  Shawn Watkins, MRN:  [id number], DOB:  1980-10-05, LOS: 0 ADMISSION DATE:  02/26/2019, CONSULTATION DATE:  1/11 REFERRING MD:  Dr. Roderic Palau, CHIEF COMPLAINT:  AMS, DKA, COVID   Brief History   39 y/o M with DM, ETOH who was recently seen on 1/3 with N/V, sore throat, body aches.  He was diagnosed with COVID at that time but left AMA.  He returned to [facility] 1/11 with altered mental status & increased work of breathing.  He was found to be in DKA and intubated due to altered mental status.  Tx to [facility] 1/11 for further care.   History of present illness   39 y/o M with DM, ETOH who was recently seen on 1/3 with N/V, sore throat, body aches.  He was diagnosed with COVID at that time but left AMA.    He returned to [facility] 1/11 with reports of altered mental status.  He was noted to have increased work of breathing.  He was found to be in DKA with a pH of 6.9, bicarb 4, AG 43, Sr Cr 2.9 and fever to 101.  The patient was intubated due to concerns for altered mental status.  He was treated with IVF. Central venous access was placed in the ER with initiation of insulin gtt, antibiotics and vasopressors. Tx to [facility] 1/11 for further care.   Past Medical History  DM II ETOH Abuse - prior alcoholic hepatitis, DT's with ETOH withdrawal seizures, pancreatitis  Depression   Significant Hospital Events   1/03 Seen in [facility] ER, dx with COVID, left AMA  1/11 Admitted with DKA, AMS, intubated.  Tx to [facility] from [facility]  Consults:  TRH  PCCM   Procedures:  ETT 1/11 >>  R Fem TLC 1/11 >>   Significant Diagnostic Tests:  UDS 1/11 (post arrival to [facility]) >>  ECHO 1/11 >>   Micro Data:  COVID 1/03 >> positive  BCx2 1/11 >>  UA 1/11 >> negative, + glucose UC 1/11 >>   Antimicrobials:  Vanco 1/11 >>  Cefepime 1/11 >>    Interim history/subjective:  [facility] reports hypotension in route, given additional 1L of IVF's.  Remains on neosynephrine and levophed gtt's.    Objective   Blood pressure (!) 90/38,  pulse (!) 143, temperature (!) 101.8 F (38.8 C), resp. rate (!) 32, height '6\' 1"'$  (1.854 m), weight 81.6 kg, SpO2 100 %.    Vent Mode: PCV FiO2 (%):  [50 %] 50 % Set Rate:  [15 bmp-28 bmp] 28 bmp PEEP:  [5 cmH20] 5 cmH20   Intake/Output Summary (Last 24 hours) at 02/26/2019 1022 Last data filed at 02/26/2019 0720 Gross per 24 hour  Intake 1000 ml  Output --  Net 1000 ml   Filed Weights   02/26/19 0030  Weight: 81.6 kg    Examination: General: thin adult male lying in bed on vent in NAD, critically ill appearing HEENT: MM pink/moist, ETT, pupils 84m/reactive  Neuro: sedate, opens eyes to voice, will intermittently nod to questions CV: s1s2 RRR, ST on monitor, no m/r/g PULM: increased work of breathing, tachypnea but not distressed (acidosis), lungs bilaterally clear  GI: soft, bsx4 active  Extremities: warm/dry, no edema  Skin: multiple areas of acne on thighs, no breakdown / ulcerations  Resolved Hospital Problem list      Assessment & Plan:   DKA  DM II  -continue insulin gtt per Endo Tool  -IVF hydration, additional 2L LR now  -follow AG, beta-hydroxybuteric  -  continue bicarb gtt for now with severe acidosis (pH <6.9 on admit) -follow BMP per protocol  -hold home glimepiride   Shock  Suspect element of volume depletion with DKA / hx ETOH use, possible sepsis with COVID, elevated PCT.  Troponin flat.  -levophed, neosynephrine for MAP >65 -add vasopressin -assess cortisol, UDS -follow cultures  -continue empiric abx -assess ECHO, HS-troponin flat  Acute Respiratory Failure  Tobacco Abuse  In setting of metabolic encephalopathy, DKA -PRVC 8cc/kg, rate 25 -allow patient to set own rate -may be able to SBT / extubate this afternoon  -follow intermittent CXR  -daily WUA/SBT -smoking cessation counseling when able   COVID-19 Infection  Positive on 1/3.  CXR clear on admit.  -follow LFT's, may be able to re-consider for remdesivir if labs improve -continue  decadron for now > may be challenging with DKA  Elevated LFT's  Suspect component of shock liver, ETOH use / alcoholic hepatitis  -assess acute hepatitis panel   AKI  Hypocalcemia Acute Metabolic Acidosis  In setting of volume depletion, hypotension -assess ABG 30 minutes post arrival to ICU  -Trend BMP / urinary output -Replace electrolytes as indicated, 1 gm calcium  -Avoid nephrotoxic agents, ensure adequate renal perfusion  ETOH Abuse  Positive on admit.  At risk for withdrawal -continue precedex, PRN versed for sedation.  May need versed gtt -monitor for withdrawal symptoms  -ETOH abuse counseling when able    Best practice:  Diet: NPO  Pain/Anxiety/Delirium protocol (if indicated): Precedex + Fentanyl, PRN versed  VAP protocol (if indicated): In place  DVT prophylaxis: Heparin SQ  GI prophylaxis: PPI  Glucose control: Insulin gtt  Mobility: BR  Code Status: Full Code  Family Communication: Mother Marcie Bal [phone]) called for update 1/11. No response / message left for return.  Disposition: ICU  Labs   CBC: Recent Labs  Lab 02/26/19 0151  WBC 10.3  NEUTROABS 5.3  HGB 14.3  HCT 47.5  MCV 97.3  PLT 643    Basic Metabolic Panel: Recent Labs  Lab 02/26/19 0151 02/26/19 0152 02/26/19 0539  NA 135  --  137  K 4.9  --  5.6*  CL 90*  --  92*  CO2 4*  --  <7*  GLUCOSE 528*  --  372*  BUN 26*  --  30*  CREATININE 2.18*  --  2.97*  CALCIUM 7.1*  --  6.6*  MG  --  2.9*  --    GFR: Estimated Creatinine Clearance: 38.5 mL/min (A) (by C-G formula based on SCr of 2.97 mg/dL (H)). Recent Labs  Lab 02/26/19 0150 02/26/19 0151 02/26/19 0539  PROCALCITON  --  0.71  --   WBC  --  10.3  --   LATICACIDVEN >11.0*  --  >11.0*    Liver Function Tests: Recent Labs  Lab 02/26/19 0151  AST 504*  ALT 150*  ALKPHOS 197*  BILITOT 1.4*  PROT 6.3*  ALBUMIN 3.2*   Recent Labs  Lab 02/26/19 0409  LIPASE 55*   No results for input(s): AMMONIA in the last  168 hours.  ABG    Component Value Date/Time   PHART <6.8 (LL) 02/26/2019 0521   PCO2ART <19.0 (LL) 02/26/2019 0521   PO2ART 270 (H) 02/26/2019 0521   HCO3 NOT CALCULATED 02/26/2019 0151   ACIDBASEDEF NOT CALCULATED 02/26/2019 0151   O2SAT 94.7 02/26/2019 0521     Coagulation Profile: No results for input(s): INR, PROTIME in the last 168 hours.  Cardiac Enzymes: No results for  input(s): CKTOTAL, CKMB, CKMBINDEX, TROPONINI in the last 168 hours.  HbA1C: Hgb A1c MFr Bld  Date/Time Value Ref Range Status  02/06/2017 01:10 PM 9.8 (H) 4.8 - 5.6 % Final    Comment:    (NOTE) Pre diabetes:          5.7%-6.4% Diabetes:              >6.4% Glycemic control for   <7.0% adults with diabetes   07/21/2016 04:41 AM 8.4 (H) 4.8 - 5.6 % Final    Comment:    (NOTE)         Pre-diabetes: 5.7 - 6.4         Diabetes: >6.4         Glycemic control for adults with diabetes: <7.0     CBG: Recent Labs  Lab 02/26/19 0458 02/26/19 0537 02/26/19 0602 02/26/19 0727 02/26/19 0818  GLUCAP 390* 323* 296* 261* 237*    Review of Systems:   Unable to complete as patient is altered on mechanical ventilation.   Past Medical History  He,  has a past medical history of Alcoholic hepatitis (~ 6219, 07/2016, 01/2017), Delirium tremens ([place]) (07/2016), Depression, ETOH abuse (07/17/2016), Pancreatitis (2006, 2017, 07/2016), Tobacco abuse (07/17/2016), Transaminitis (07/17/2016), and Type 2 diabetes mellitus ([place]) (02/06/2017).   Surgical History   History reviewed. No pertinent surgical history.   Social History   reports that he has been smoking cigarettes. He started smoking about 12 years ago. He has been smoking about 1.00 pack per day. He has never used smokeless tobacco. He reports current alcohol use. He reports that he does not use drugs.   Family History   His family history is not on file.   Allergies No Known Allergies   Home Medications  Prior to Admission medications   Medication Sig  Start Date End Date Taking? Authorizing Provider  blood glucose meter kit and supplies Dispense based on patient and insurance preference. Use up to four times daily as directed. (FOR ICD-10 E10.9, E11.9). 02/19/17   Irwin Brakeman L, MD  glimepiride (AMARYL) 2 MG tablet Take 1 tablet (2 mg total) by mouth daily with breakfast. 02/19/17 03/21/17  Irwin Brakeman L, MD  ibuprofen (ADVIL,MOTRIN) 200 MG tablet Take 400-600 mg by mouth every 6 (six) hours as needed for moderate pain.    [provider]  Multiple Vitamins-Minerals (MULTIVITAMIN) tablet Take 1 tablet by mouth daily. 02/10/17   Debbe Odea, MD  ondansetron (ZOFRAN ODT) 4 MG disintegrating tablet Take 1 tablet (4 mg total) by mouth every 8 (eight) hours as needed for nausea or vomiting. 02/18/19   [place], [place] N, PA-C     Critical care time: 45 minutes      Noe Gens, MSN, NP-C [facility] 02/26/2019, 10:22 AM   Please see [email or web address] for pager details.

## 2019-02-26 NOTE — ED Notes (Signed)
Wasn't confused or sob at 3pm. S/s noticed 10pm

## 2019-02-26 NOTE — Progress Notes (Signed)
Inpatient Diabetes Program Recommendations  AACE/ADA: New Consensus Statement on Inpatient Glycemic Control (2015)  Target Ranges:  Prepandial:   less than 140 mg/dL      Peak postprandial:   less than 180 mg/dL (1-2 hours)      Critically ill patients:  140 - 180 mg/dL   Lab Results  Component Value Date   GLUCAP 237 (H) 02/26/2019   HGBA1C 9.8 (H) 02/06/2017    Review of Glycemic Control Results for ALEKSANDAR, DUVE (MRN [id number]) as of 02/26/2019 10:55  Ref. Range 02/26/2019 04:23 02/26/2019 04:58 02/26/2019 05:37 02/26/2019 06:02 02/26/2019 07:27 02/26/2019 08:18  Glucose-Capillary Latest Ref Range: 70 - 99 mg/dL 427 (H) 062 (H) 376 (H) 296 (H) 261 (H) 237 (H)   Diabetes history: DM-Severe DKA (type 1 per MD visit in April, 2020) Outpatient Diabetes medications: Basaglar 5 units daily, Amdelog 3-6 units with meals Current orders for Inpatient glycemic control:  IV insulin/DKA order set Inpatient Diabetes Program Recommendations:    Referral received.  May need to change Type of DM to "type 1" in EndoTool.  Dextrose has been added to IV fluids.  Will follow.   Thanks,  Beryl Meager, RN, BC-ADM Inpatient Diabetes Coordinator Pager [phone] (8a-5p)

## 2019-02-26 NOTE — ED Notes (Signed)
Couple hours unresponsive per brother

## 2019-02-26 NOTE — ED Provider Notes (Addendum)
[facility] EMERGENCY DEPARTMENT Provider Note   CSN: [id number] Arrival date & time: 02/25/19  2343    Time seen 12:07 AM.  History Chief Complaint  Patient presents with  . Altered Mental Status   Level 5 caveat for altered mental status  Shawn Watkins is a 39 y.o. male.  HPI patient was seen in the ED on January 3 and had a positive Covid test.  At that time he was having nausea and vomiting with body aches cough sore throat nasal congestion and productive cough.  He has a history of alcohol abuse reported he had started drinking over Christmas.  He also reported exposure to people with Covid.  At that visit he had a anion gap of 22 and he was encouraged to stay to be admitted to the hospital for DKA however he refused and signed out AMA.  Per brother who dropped him off in the ED he had an acute change about 10 PM tonight and got confused and more short of breath.   PCP Patient, No Pcp Per   Past Medical History:  Diagnosis Date  . Alcoholic hepatitis ~ 8250, 07/2016, 01/2017   course of prednisolone in 07/2016  . Delirium tremens ([place]) 07/2016   also hx ETOH withdrawal seizures prior to 2018  . Depression   . ETOH abuse 07/17/2016  . Pancreatitis 2006, 2017, 07/2016   due to ETOH  . Tobacco abuse 07/17/2016  . Transaminitis 07/17/2016  . Type 2 diabetes mellitus ([place]) 02/06/2017    Patient Active Problem List   Diagnosis Date Noted  . DKA (diabetic ketoacidoses) ([place]) 02/26/2019  . Alcohol intoxication ([place]) 02/18/2017  . Alcohol withdrawal ([place]) 02/18/2017  . Alcohol induced acute pancreatitis 02/10/2017  . Hypokalemia 02/10/2017  . Lactic acidosis 02/10/2017  . Alcohol abuse 02/06/2017  . Severe dehydration 02/06/2017  . Fever and chills 02/06/2017  . Type 2 diabetes mellitus ([place]) 02/06/2017  . Acute alcoholic hepatitis 03/70/4888  . Alcoholic pancreatitis 91/69/4503  . Acute gallstone pancreatitis 07/17/2016  . Transaminitis 07/17/2016  . Leukocytosis 07/17/2016   . Hyponatremia 07/17/2016  . ETOH abuse 07/17/2016  . Tobacco abuse 07/17/2016    History reviewed. No pertinent surgical history.     No family history on file.  Social History   Tobacco Use  . Smoking status: Current Every Day Smoker    Packs/day: 1.00    Types: Cigarettes    Start date: 08/04/2006  . Smokeless tobacco: Never Used  Substance Use Topics  . Alcohol use: Yes    Comment: daily  . Drug use: No    Home Medications Prior to Admission medications   Medication Sig Start Date End Date Taking? Authorizing Provider  blood glucose meter kit and supplies Dispense based on patient and insurance preference. Use up to four times daily as directed. (FOR ICD-10 E10.9, E11.9). 02/19/17   Irwin Brakeman L, MD  glimepiride (AMARYL) 2 MG tablet Take 1 tablet (2 mg total) by mouth daily with breakfast. 02/19/17 03/21/17  Irwin Brakeman L, MD  ibuprofen (ADVIL,MOTRIN) 200 MG tablet Take 400-600 mg by mouth every 6 (six) hours as needed for moderate pain.    [provider]  Multiple Vitamins-Minerals (MULTIVITAMIN) tablet Take 1 tablet by mouth daily. 02/10/17   Debbe Odea, MD  ondansetron (ZOFRAN ODT) 4 MG disintegrating tablet Take 1 tablet (4 mg total) by mouth every 8 (eight) hours as needed for nausea or vomiting. 02/18/19   Robinson, [place] N, PA-C  Allergies    Patient has no known allergies.  Review of Systems   Review of Systems  Unable to perform ROS: Mental status change    Physical Exam Updated Vital Signs BP (!) 74/37   Pulse (!) 122   Temp 100.2 F (37.9 C)   Resp (!) 29   Ht _0  (1.854 m)   Wt 81.6 kg   SpO2 100%   BMI 23.73 kg/m   Physical Exam Vitals and nursing note reviewed.  Constitutional:      Appearance: He is normal weight.     Comments: Patient does not respond to verbal or tactile stimulus  HENT:     Head: Normocephalic and atraumatic.     Right Ear: External ear normal.     Left Ear: External ear normal.     Nose:  Nose normal.     Mouth/Throat:     Mouth: Mucous membranes are dry.  Eyes:     Extraocular Movements: Extraocular movements intact.     Conjunctiva/sclera: Conjunctivae normal.     Pupils: Pupils are equal, round, and reactive to light.     Comments: Pupils are dilated  Cardiovascular:     Rate and Rhythm: Bradycardia present. Rhythm irregular.     Comments: Patient was initially noted to have some intermittent episodes of atrial bigeminy and then was in sinus bradycardia. Patient had carotid and femoral pulses although slow.  His initial blood pressure was 60 systolic.  After 1 L of IV fluids it was in the 90s. Pulmonary:     Effort: Tachypnea and respiratory distress present.     Comments: Patient is noted to have strong respiratory effort, pulse ox was 100% Abdominal:     General: Abdomen is flat. Bowel sounds are normal.     Palpations: Abdomen is soft.  Musculoskeletal:        General: No deformity. Normal range of motion.     Cervical back: Normal range of motion.     Right lower leg: No edema.     Left lower leg: No edema.  Skin:    Comments: Skin is cool and dry  Neurological:     Comments: Patient does not respond to verbal or painful stimuli  Psychiatric:     Comments: Unable to assess     ED Results / Procedures / Treatments   Labs (all labs ordered are listed, but only abnormal results are displayed)  Results for orders placed or performed during the hospital encounter of 02/26/19  Blood Culture (routine x 2)   Specimen: BLOOD RIGHT ARM  Result Value Ref Range   Specimen Description BLOOD RIGHT ARM    Special Requests      BOTTLES DRAWN AEROBIC ONLY Blood Culture results may not be optimal due to an inadequate volume of blood received in culture bottles Performed at [facility], [address] [place], [place] [address]    Culture PENDING    Report Status PENDING   Blood Culture (routine x 2)   Specimen: BLOOD RIGHT WRIST  Result Value Ref Range    Specimen Description BLOOD RIGHT WRIST    Special Requests      BOTTLES DRAWN AEROBIC ONLY Blood Culture adequate volume Performed at [facility], [address] [place], [place] [address]    Culture PENDING    Report Status PENDING   Lactic acid, plasma  Result Value Ref Range   Lactic Acid, Venous >11.0 (HH) 0.5 - 1.9 mmol/L  CBC WITH DIFFERENTIAL  Result Value Ref Range   WBC 10.3 4.0 - 10.5 K/uL   RBC 4.88 4.22 - 5.81 MIL/uL   Hemoglobin 14.3 13.0 - 17.0 g/dL   HCT 47.5 39.0 - 52.0 %   MCV 97.3 80.0 - 100.0 fL   MCH 29.3 26.0 - 34.0 pg   MCHC 30.1 30.0 - 36.0 g/dL   RDW 13.0 11.5 - 15.5 %   Platelets 174 150 - 400 K/uL   nRBC 0.0 0.0 - 0.2 %   Neutrophils Relative % 52 %   Neutro Abs 5.3 1.7 - 7.7 K/uL   Lymphocytes Relative 25 %   Lymphs Abs 2.6 0.7 - 4.0 K/uL   Monocytes Relative 7 %   Monocytes Absolute 0.7 0.1 - 1.0 K/uL   Eosinophils Relative 6 %   Eosinophils Absolute 0.6 (H) 0.0 - 0.5 K/uL   Basophils Relative 0 %   Basophils Absolute 0.0 0.0 - 0.1 K/uL   Immature Granulocytes 10 %   Abs Immature Granulocytes 1.05 (H) 0.00 - 0.07 K/uL  Comprehensive metabolic panel  Result Value Ref Range   Sodium 135 135 - 145 mmol/L   Potassium 4.9 3.5 - 5.1 mmol/L   Chloride 90 (L) 98 - 111 mmol/L   CO2 4 (L) 22 - 32 mmol/L   Glucose, Bld 528 (HH) 70 - 99 mg/dL   BUN 26 (H) 6 - 20 mg/dL   Creatinine, Ser 2.18 (H) 0.61 - 1.24 mg/dL   Calcium 7.1 (L) 8.9 - 10.3 mg/dL   Total Protein 6.3 (L) 6.5 - 8.1 g/dL   Albumin 3.2 (L) 3.5 - 5.0 g/dL   AST 504 (H) 15 - 41 U/L   ALT 150 (H) 0 - 44 U/L   Alkaline Phosphatase 197 (H) 38 - 126 U/L   Total Bilirubin 1.4 (H) 0.3 - 1.2 mg/dL   GFR calc non Af Amer 37 (L) >60 mL/min   GFR calc Af Amer 43 (L) >60 mL/min   Anion gap 41 (H) 5 - 15  D-dimer, quantitative  Result Value Ref Range   D-Dimer, Quant 5.14 (H) 0.00 - 0.50 ug/mL-FEU  Procalcitonin  Result Value Ref Range   Procalcitonin 0.71 ng/mL  Lactate dehydrogenase    Result Value Ref Range   LDH 336 (H) 98 - 192 U/L  Ferritin  Result Value Ref Range   Ferritin 750 (H) 24 - 336 ng/mL  Triglycerides  Result Value Ref Range   Triglycerides 586 (H) <150 mg/dL  Fibrinogen  Result Value Ref Range   Fibrinogen 230 210 - 475 mg/dL  C-reactive protein  Result Value Ref Range   CRP 2.2 (H) <1.0 mg/dL  Blood gas, venous  Result Value Ref Range   FIO2 50.00    pH, Ven <6.8 (LL) 7.250 - 7.430   pCO2, Ven 27.0 (L) 44.0 - 60.0 mmHg   pO2, Ven 92.3 (H) 32.0 - 45.0 mmHg   Bicarbonate NOT CALCULATED 20.0 - 28.0 mmol/L   Acid-Base Excess NOT CALCULATED 0.0 - 2.0 mmol/L   Acid-base deficit NOT CALCULATED 0.0 - 2.0 mmol/L   O2 Saturation 80.4 %   Patient temperature 37.0   Beta-hydroxybutyric acid  Result Value Ref Range   Beta-Hydroxybutyric Acid 8.71 (H) 0.05 - 0.27 mmol/L  Urinalysis, Routine w reflex microscopic  Result Value Ref Range   Color, Urine YELLOW YELLOW   APPearance CLEAR CLEAR   Specific Gravity, Urine 1.022 1.005 - 1.030   pH 5.0 5.0 - 8.0   Glucose, UA >=500 (  A) NEGATIVE mg/dL   Hgb urine dipstick SMALL (A) NEGATIVE   Bilirubin Urine NEGATIVE NEGATIVE   Ketones, ur 20 (A) NEGATIVE mg/dL   Protein, ur 100 (A) NEGATIVE mg/dL   Nitrite NEGATIVE NEGATIVE   Leukocytes,Ua NEGATIVE NEGATIVE   RBC / HPF 0-5 0 - 5 RBC/hpf   WBC, UA 0-5 0 - 5 WBC/hpf   Bacteria, UA RARE (A) NONE SEEN   Mucus PRESENT   Magnesium  Result Value Ref Range   Magnesium 2.9 (H) 1.7 - 2.4 mg/dL  Ethanol  Result Value Ref Range   Alcohol, Ethyl (B) 215 (H) <10 mg/dL  Lipase, blood  Result Value Ref Range   Lipase 55 (H) 11 - 51 U/L  Blood gas, arterial  Result Value Ref Range   FIO2 50.00    pH, Arterial <6.8 (LL) 7.350 - 7.450   pCO2 arterial <19.0 (LL) 32.0 - 48.0 mmHg   pO2, Arterial 270 (H) 83.0 - 108.0 mmHg   Bicarbonate PENDING 20.0 - 28.0 mmol/L   O2 Saturation 94.7 %   Patient temperature 37.2    Allens test (pass/fail) PASS PASS  CBG  monitoring, ED  Result Value Ref Range   Glucose-Capillary 552 (HH) 70 - 99 mg/dL  CBG monitoring, ED  Result Value Ref Range   Glucose-Capillary 424 (H) 70 - 99 mg/dL  CBG monitoring, ED  Result Value Ref Range   Glucose-Capillary 426 (H) 70 - 99 mg/dL  CBG monitoring, ED  Result Value Ref Range   Glucose-Capillary 403 (H) 70 - 99 mg/dL  CBG monitoring, ED  Result Value Ref Range   Glucose-Capillary 390 (H) 70 - 99 mg/dL  CBG monitoring, ED  Result Value Ref Range   Glucose-Capillary 323 (H) 70 - 99 mg/dL  CBG monitoring, ED  Result Value Ref Range   Glucose-Capillary 296 (H) 70 - 99 mg/dL  Troponin I (High Sensitivity)  Result Value Ref Range   Troponin I (High Sensitivity) 19 (H) <18 ng/L  Troponin I (High Sensitivity)  Result Value Ref Range   Troponin I (High Sensitivity) 37 (H) <18 ng/L     Laboratory interpretation all normal except improving hyperglycemia on insulin drip, severe metabolic acidosis, very high lactic acidosis, alcohol intoxication, glucosuria    Results for orders placed or performed during the hospital encounter of 02/18/19  Novel Coronavirus, NAA (Hosp order, Send-out to Ref Lab; TAT 18-24 hrs   Specimen: Nasopharyngeal Swab; Respiratory  Result Value Ref Range   SARS-CoV-2, NAA DETECTED (A) NOT DETECTED   Coronavirus Source NASOPHARYNGEAL       EKG EKG Interpretation  Date/Time:  Monday February 26 2019 00:37:40 EST Ventricular Rate:  45 PR Interval:    QRS Duration: 97 QT Interval:  531 QTC Calculation: 460 R Axis:   86 Text Interpretation: Slow sinus arrhythmia Anteroseptal infarct, age indeterminate Since last tracing rate slower 06 Feb 2017 j wave present Reconfirmed by Rolland Porter [phone]) on 02/26/2019 6:15:54 AM ? Duanne Guess present  Radiology DG Chest Port 1 View  Result Date: 02/26/2019 CLINICAL DATA:  Encephalopathy EXAM: PORTABLE CHEST 1 VIEW COMPARISON:  02/18/2019 FINDINGS: Endotracheal tube tip is at the level of the  clavicular heads. The lungs are clear. The cardiomediastinal contours are normal. IMPRESSION: Endotracheal tube tip at the level of the clavicular heads. Electronically Signed   By: Ulyses Jarred M.D.   On: 02/26/2019 01:30    Procedures Procedure Name: Intubation Date/Time: 02/26/2019 12:49 AM Performed by: Rolland Porter, MD Pre-anesthesia Checklist:  Patient identified, Emergency Drugs available, Suction available, Patient being monitored and Timeout performed Oxygen Delivery Method: Non-rebreather mask Preoxygenation: Pre-oxygenation with 100% oxygen Induction Type: Rapid sequence Laryngoscope Size: 3 Grade View: Grade I Tube type: Non-subglottic suction tube Tube size: 7.5 mm Number of attempts: 1 Placement Confirmation: ETT inserted through vocal cords under direct vision,  Positive ETCO2 and Breath sounds checked- equal and bilateral Secured at: 22 cm Tube secured with: ETT holder Dental Injury: Teeth and Oropharynx as per pre-operative assessment      .Critical Care Performed by: Rolland Porter, MD Authorized by: Rolland Porter, MD   Critical care provider statement:    Critical care time (minutes):  63   Critical care was necessary to treat or prevent imminent or life-threatening deterioration of the following conditions:  Circulatory failure, respiratory failure, dehydration and endocrine crisis   Critical care was time spent personally by me on the following activities:  Discussions with consultants, examination of patient, ordering and review of laboratory studies, ordering and review of radiographic studies, pulse oximetry, re-evaluation of patient's condition and review of old charts   (including critical care time)  Medications Ordered in ED Medications  midazolam (VERSED) 50 mg/50 mL (1 mg/mL) premix infusion (0.5 mg/hr Intravenous New Bag/Given 02/26/19 0104)  insulin regular, human (MYXREDLIN) 100 units/ 100 mL infusion (24 Units/hr Intravenous Rate/Dose Change 02/26/19 0539)    dextrose 5 %-0.45 % sodium chloride infusion ( Intravenous Hold 02/26/19 0304)  dextrose 50 % solution 0-50 mL (has no administration in time range)  sodium bicarbonate 150 mEq in sterile water 1,000 mL infusion ( Intravenous New Bag/Given 02/26/19 0349)  vancomycin (VANCOREADY) IVPB 1750 mg/350 mL (1,750 mg Intravenous New Bag/Given 02/26/19 0319)  ceFEPIme (MAXIPIME) 2 g in sodium chloride 0.9 % 100 mL IVPB (has no administration in time range)  phenylephrine (NEOSYNEPHRINE) 10-0.9 MG/250ML-% infusion (20 mcg/min Intravenous New Bag/Given 02/26/19 0607)  sodium chloride 0.9 % bolus 1,000 mL (0 mLs Intravenous Stopped 02/26/19 0243)  sodium chloride 0.9 % bolus 1,000 mL (0 mLs Intravenous Stopped 02/26/19 0213)  sodium chloride 0.9 % bolus 1,000 mL (0 mLs Intravenous Stopped 02/26/19 0122)  etomidate (AMIDATE) injection 10 mg (10 mg Intravenous Given 02/26/19 0023)  succinylcholine (ANECTINE) injection 125 mg (125 mg Intravenous Given 02/26/19 0024)  ceFEPIme (MAXIPIME) 2 g in sodium chloride 0.9 % 100 mL IVPB (0 g Intravenous Stopped 02/26/19 0350)  metroNIDAZOLE (FLAGYL) IVPB 500 mg (0 mg Intravenous Stopped 02/26/19 0452)  sodium bicarbonate injection 50 mEq (50 mEq Intravenous Given 02/26/19 0315)  sodium chloride 0.9 % bolus 1,000 mL (0 mLs Intravenous Stopped 02/26/19 0452)  sodium chloride 0.9 % bolus 500 mL (0 mLs Intravenous Stopped 02/26/19 0539)  sodium bicarbonate injection 50 mEq (50 mEq Intravenous Given 02/26/19 0559)    ED Course  I have reviewed the triage vital signs and the nursing notes.  Pertinent labs & imaging results that were available during my care of the patient were reviewed by me and considered in my medical decision making (see chart for details).    MDM Rules/Calculators/A&P                     Patient was unresponsive and initially having some shallow breathing.  He was placed on the bed and placed on the monitor.  He did have a carotid and femoral pulses, I did not  feel radial pulse.  He is noted to have a bradycardia with intermittent episodes of some atrial bigeminy.  His  initial pulse ox was 70% on his finger however when it was placed on his forehead after he had been put on a nonrebreather mask it was 100%.  His respiratory rate was 16.  Patient remained unresponsive.  Due to his altered mental status and concern for protecting his airway he was intubated.  He was given an IV bolus of fluid for an initial blood pressure in the low 60s.  After 1500 cc bolus his blood pressure was 90/34.  Due to his bradycardia rectal temp was done and he was hypothermic.  He was placed on a warming blanket.  After intubation NG was inserted and temp Foley was inserted.  Laboratory testing was done.  Post intubation chest x-ray was done.   12:48 AM patient is starting to breathe over the ventilator, he was started on a Versed drip.   1:15 AM patient's blood pressure is 104/43, heart rate 77, respiratory rate 19, pulse ox 100% on ventilator, rectal temp 33.2 celsius which is 91.7 fahrenheit, he has the warming blanket in place.  Patient's initial CBG was in the 552, he was started on a insulin drip.  Patient's VBG shows severe metabolic acidosis with pH 6.8.  He was given 1 amp of bicarb and started on a bicarb drip.  His initial lactic acid was greater than 11.  Patient continues to breathe faster than the vent settings, I suspect this is to compensate for his severe metabolic acidosis.  2:40 AM patient was started on IV antibiotics for infection with unknown source.  He presented hypothermic and hypotensive.  3:12 AM Dr. Darrick Meigs, hospitalist feels patient be transferred to another facility  3:28 AM Dr. Gillermina Phy, intensivist, accepts in transfer to [facility] ICU under the care of Dr. Vanita Ingles  3:28 AM nursing staff reports patient is getting hypotensive, it appears about an hour ago his blood pressure got into 97/39, at 3 AM was 84/39, and at 320 it was 92/31 with  heart rate 109.  His rectal temperature is now 95 degrees and his heart rate now is 109.  4:20 AM patient's blood pressure is responding to the fourth liter bolus, his blood pressure is 97/37, heart rate is 114.  His temperature now is 97.5.  Patient is getting his bicarb drip after a bolus of 50 mEq.  He is still breathing faster than the vent however I think this is from him trying to correct his metabolic acidosis.  His CBG at 4:23 AM was 403.  He remains on insulin drip.  5:45 AM patient's blood pressure responded temporarily up to 95/36 with the IV fluid bolus, his blood pressure now is 89/37.  He was started on a Neo-Synephrine drip.  A repeat VBG shows his pH is still less than 6.8 and his PCO2 is less than 19.  Patient was given another amp of bicarb.  He is on a bicarb drip.  6:30 AM Neosynephrine drip at 400 mcg/min, levofed added.  6:39 AM patient was discussed with Dr. Gillermina Phy, critical care again.  He states to give him another amp of bicarb and to give him 2 boluses of the bicarb drips.  I had noticed that patient's hypotension is responsive to IV fluids.  He is gotten 4 L IV fluid plus the fluid he got with antibiotics and other medications.  Patient's lactic acid still remains greater than 11, his ABG shows pH less than 6.8.  He recommends to do another be met which was ordered.  7:20 AM patient's blood  pressure is 90 systolic, heart rate is 847, his temperature is now 101.3, he was hypothermic.  He was treated with rectal Tylenol.  Patient's bicarb is still running at 150 cc/h, the nurse was instructed it need to be given as a bolus.  7:20 AM I attempted to call patient's brother, Maricela Curet with phone number listed in the chart.  However it goes to voicemail for someone named Gerald Stabs.  No message was left.  Final Clinical Impression(s) / ED Diagnoses Final diagnoses:  Glasgow coma scale total score 3-8, at arrival to emergency department [facility])  Dehydration  Hypothermia, initial  encounter  Diabetic ketoacidosis with coma associated with type 2 diabetes mellitus ([place])  Alcoholic intoxication without complication ([place])  Lactic acidosis  COVID-19  Hypotension due to hypovolemia  Abnormal liver function tests  AKI (acute kidney injury) [facility])    Rx / DC Orders Plan admission  Rolland Porter, MD, Barbette Or, MD 02/26/19 [place], [place], MD 02/26/19 [phone]

## 2019-02-26 NOTE — ED Notes (Signed)
Date and time results received: 02/26/19 0219  Test: pH Critical Value: < 6.8  Name of Provider Notified: Lynelle Doctor  Orders Received? Or Actions Taken?: na

## 2019-02-26 NOTE — ED Notes (Signed)
Date and time results received: 02/26/19 0249  Test: glucose Critical Value: 528  Name of Provider Notified: knapp  Orders Received? Or Actions Taken?: pt on insulin drip

## 2019-02-26 NOTE — Progress Notes (Signed)
[facility] called to notify that Shawn Watkins had a Lactate of >11.0. Unsure of accuracy considering lab had been drawn at 1815 and results just received at 2320. E-link notified.

## 2019-02-26 NOTE — ED Notes (Signed)
Confused and having trouble breathing today per brother

## 2019-02-26 NOTE — ED Notes (Signed)
pts temp returned to normal, bear hugger turned off, warm blankets placed on pt.

## 2019-02-26 NOTE — ED Notes (Signed)
CRITICAL VALUE ALERT  Critical Value:  PH < 6.8, PCO2 <19 Date & Time Notied: 02/26/19 @ 0543 Provider Notified:Dr I Lynelle Doctor Orders Received/Actions taken: None yet

## 2019-02-26 NOTE — ED Notes (Signed)
  Shawn Watkins   [phone]

## 2019-02-26 NOTE — ED Notes (Signed)
Patient still has PH below 6.8 , PCO2 is 19 -- Patient still Hyperventilating on vent. Patient is ventilating and oxygenation is good. Not much more from resp stand point needs adjusting. Patient is in metabolic acidosis.

## 2019-02-26 NOTE — Plan of Care (Signed)
  Problem: Education: Goal: Knowledge of risk factors and measures for prevention of condition will improve 02/26/2019 2006 by Elsie Ra, RN Outcome: Not Progressing 02/26/2019 2006 by Elsie Ra, RN Outcome: Not Progressing   Problem: Coping: Goal: Psychosocial and spiritual needs will be supported 02/26/2019 2006 by Elsie Ra, RN Outcome: Not Progressing 02/26/2019 2006 by Elsie Ra, RN Outcome: Not Progressing   Problem: Respiratory: Goal: Will maintain a patent airway 02/26/2019 2006 by Elsie Ra, RN Outcome: Not Progressing 02/26/2019 2006 by Elsie Ra, RN Outcome: Not Progressing Goal: Complications related to the disease process, condition or treatment will be avoided or minimized 02/26/2019 2006 by Elsie Ra, RN Outcome: Not Progressing 02/26/2019 2006 by Elsie Ra, RN Outcome: Not Progressing   Problem: Education: Goal: Ability to describe self-care measures that may prevent or decrease complications (Diabetes Survival Skills Education) will improve 02/26/2019 2006 by Elsie Ra, RN Outcome: Not Progressing 02/26/2019 2006 by Elsie Ra, RN Outcome: Not Progressing Goal: Individualized Educational Video(s) 02/26/2019 2006 by Elsie Ra, RN Outcome: Not Progressing 02/26/2019 2006 by Elsie Ra, RN Outcome: Not Progressing   Problem: Coping: Goal: Ability to adjust to condition or change in health will improve 02/26/2019 2006 by Elsie Ra, RN Outcome: Not Progressing 02/26/2019 2006 by Elsie Ra, RN Outcome: Not Progressing   Problem: Fluid Volume: Goal: Ability to maintain a balanced intake and output will improve 02/26/2019 2006 by Elsie Ra, RN Outcome: Not Progressing 02/26/2019 2006 by Elsie Ra, RN Outcome: Not Progressing   Problem: Health Behavior/Discharge Planning: Goal: Ability to identify and utilize available resources and services will  improve 02/26/2019 2006 by Elsie Ra, RN Outcome: Not Progressing 02/26/2019 2006 by Elsie Ra, RN Outcome: Not Progressing Goal: Ability to manage health-related needs will improve 02/26/2019 2006 by Elsie Ra, RN Outcome: Not Progressing 02/26/2019 2006 by Elsie Ra, RN Outcome: Not Progressing   Problem: Metabolic: Goal: Ability to maintain appropriate glucose levels will improve 02/26/2019 2006 by Elsie Ra, RN Outcome: Not Progressing 02/26/2019 2006 by Elsie Ra, RN Outcome: Not Progressing   Problem: Nutritional: Goal: Maintenance of adequate nutrition will improve 02/26/2019 2006 by Elsie Ra, RN Outcome: Not Progressing 02/26/2019 2006 by Elsie Ra, RN Outcome: Not Progressing Goal: Progress toward achieving an optimal weight will improve 02/26/2019 2006 by Elsie Ra, RN Outcome: Not Progressing 02/26/2019 2006 by Elsie Ra, RN Outcome: Not Progressing   Problem: Skin Integrity: Goal: Risk for impaired skin integrity will decrease 02/26/2019 2006 by Elsie Ra, RN Outcome: Not Progressing 02/26/2019 2006 by Elsie Ra, RN Outcome: Not Progressing   Problem: Tissue Perfusion: Goal: Adequacy of tissue perfusion will improve 02/26/2019 2006 by Elsie Ra, RN Outcome: Not Progressing 02/26/2019 2006 by Elsie Ra, RN Outcome: Not Progressing   Problem: Activity: Goal: Ability to tolerate increased activity will improve 02/26/2019 2006 by Elsie Ra, RN Outcome: Not Progressing 02/26/2019 2006 by Elsie Ra, RN Outcome: Not Progressing   Problem: Respiratory: Goal: Ability to maintain a clear airway and adequate ventilation will improve 02/26/2019 2006 by Elsie Ra, RN Outcome: Not Progressing 02/26/2019 2006 by Elsie Ra, RN Outcome: Not Progressing   Problem: Role Relationship: Goal: Method of communication will improve 02/26/2019 2006 by Elsie Ra, RN Outcome: Not Progressing 02/26/2019 2006 by Elsie Ra, RN Outcome: Not Progressing

## 2019-02-26 NOTE — ED Notes (Signed)
Increased rate to 28 to keep up with patient. Patient getting BiCARB amp push. Starting pressors for BP

## 2019-02-26 NOTE — ED Notes (Addendum)
Staff present upon arrival to room: Dorathy Daft, RN, Claris Gower RN, Lauren RN, Toni RN Dr Lynelle Doctor and Molly Maduro RT arrived few minutes after pt arrived.

## 2019-02-26 NOTE — ED Notes (Signed)
hasnt ate in 5 days

## 2019-02-26 NOTE — Progress Notes (Signed)
  Echocardiogram 2D Echocardiogram has been performed.  Shawn Watkins 02/26/2019, 3:51 PM

## 2019-02-26 NOTE — ED Notes (Signed)
Trevone, pts dad called & updated

## 2019-02-26 NOTE — Progress Notes (Signed)
Pharmacy Antibiotic Note  Shawn Watkins is a 39 y.o. male admitted on 02/26/2019 with sepsis.  Pharmacy has been consulted for Cefepime and Vancomycin dosing.  SCr 0.61 > 2.18 > 2.97 WBC 10.3  Plan: Cefepime 2gm IV q12h Vancomycin 1750 mg IV x1 dose.  Further vancomycin dosing per renal function. Goal AUC 400-550.   Will f/u renal function, micro data, and pt's clinical condition Vanc levels prn   Height: 6\' 1"  (185.4 cm) Weight: 179 lb 14.3 oz (81.6 kg) IBW/kg (Calculated) : 79.9  Temp (24hrs), Avg:98.3 F (36.8 C), Min:91.9 F (33.3 C), Max:101.8 F (38.8 C)  Recent Labs  Lab 02/26/19 0150 02/26/19 0151 02/26/19 0539  WBC  --  10.3  --   CREATININE  --  2.18* 2.97*  LATICACIDVEN >11.0*  --  >11.0*    Estimated Creatinine Clearance: 38.5 mL/min (A) (by C-G formula based on SCr of 2.97 mg/dL (H)).    No Known Allergies  Antimicrobials this admission: 1/11 Cefepime >>  1/11 Vanc >>   Microbiology results: 1/11 BCx: ngtd 1/11 UCx:   MRSA PCR:  Thank you for allowing pharmacy to be a part of this patient's care.  3/11 PharmD, BCPS Clinical pharmacist phone 7am- 5pm: [phone] 02/26/2019 10:30 AM

## 2019-02-26 NOTE — Progress Notes (Signed)
68M with reported history of T2DM who presented to [facility] ED with severe DKA in the setting of recent diagnosis of COVID. He is intubated, hypotensive, severely acidemic (pH < 6.8), lactic acid > 11, and hypothermic. Labs notable for blood sugar in 500s, EtOH level of 215, Cr 2.18 (AKI). He is receiving insulin drip and bicarb drip. Transfer is requested for higher level of care.  Plan: Transfer to [facility]. [facility] informed. Transfer is for emergent life-threatening illness requiring higher level of care.

## 2019-02-26 NOTE — ED Notes (Signed)
Patient still on PC 20 , rate 25 peep 5, FiO2 50, saturation 100. Temp 37, BP 89/37. Still breathing over vent , sedation still problem because of low BP,

## 2019-02-26 NOTE — ED Provider Notes (Signed)
I was asked by nursing to place a central line as this patient is critically ill on multiple drips without adequate access.  Please see note below, this was placed successfully clinically without ultrasound guidance in the right femoral vein, good blood return, dark blood, nonpulsatile, free flow of fluids.  We will switch over pressors and bicarbonate to this line.  Marthenia Rolling Line  Date/Time: 02/26/2019 7:09 AM Performed by: Eber Hong, MD Authorized by: Eber Hong, MD   Consent:    Consent obtained:  Emergent situation Pre-procedure details:    Hand hygiene: Hand hygiene performed prior to insertion     Sterile barrier technique: All elements of maximal sterile technique followed     Skin preparation:  ChloraPrep   Skin preparation agent: Skin preparation agent completely dried prior to procedure   Anesthesia (see MAR for exact dosages):    Anesthesia method:  None Procedure details:    Location:  R femoral   Patient position:  Flat   Procedural supplies:  Triple lumen   Catheter size:  7 Fr   Landmarks identified: yes     Ultrasound guidance: no     Number of attempts:  1   Successful placement: yes   Post-procedure details:    Post-procedure:  Dressing applied and line sutured   Assessment:  Blood return through all ports and free fluid flow   Patient tolerance of procedure:  Tolerated well, no immediate complications Comments:            Eber Hong, MD 02/26/19 [id number]

## 2019-02-26 NOTE — Progress Notes (Signed)
Pharmacy Antibiotic Note  Shawn Watkins is a 39 y.o. male admitted on 02/26/2019 with sepsis.  Pharmacy has been consulted for Cefepime and Vancomycin dosing.  Plan: Cefepime 2gm IV q8h Vancomycin 1750 mg IV Q 12 hrs. Goal AUC 400-550. Expected AUC: 484 SCr used: 0.8 Will f/u renal function, micro data, and pt's clinical condition Vanc levels prn   Height: 6\' 1"  (185.4 cm) Weight: 179 lb 14.3 oz (81.6 kg) IBW/kg (Calculated) : 79.9  Temp (24hrs), Avg:92.2 F (33.4 C), Min:91.9 F (33.3 C), Max:92.5 F (33.6 C)  Recent Labs  Lab 02/26/19 0150  LATICACIDVEN >11.0*    Estimated Creatinine Clearance: 142.9 mL/min (by C-G formula based on SCr of 0.61 mg/dL).    No Known Allergies  Antimicrobials this admission: 1/11 Cefepime >>  1/11 Vanc >>   Microbiology results: 1/11 BCx:  1/11 UCx:    Thank you for allowing pharmacy to be a part of this patient's care.  3/11, PharmD, BCPS Please see amion for complete clinical pharmacist phone list 02/26/2019 2:43 AM

## 2019-02-26 NOTE — ED Notes (Addendum)
Patient dropped in ER by brother passed out in wating area. Brought back to room 5 and intubated History of ETOH , covid Positive. Having trouble ventilating patient because of sedation and Him breathing over vent. Placed on Pressure control for now. Suspect he is keto acidosis along with resp failure. He is inhaling 1200 to 1400 VT , VE is 26.9 FiO2 at  50 , saturation 100

## 2019-02-26 NOTE — ED Notes (Signed)
Hyacinth Meeker, MD at bedside placing cental Line in R fem, verbal order to place infusing lines to cental line, Myxredlin, Sodium bicarbonate 150 mEq in 1000 mL sterile water, Phenylephrine & Norepinephrine placed on central line

## 2019-02-26 NOTE — H&P (Addendum)
History and Physical    Shawn Watkins TKW:[id number] DOB: [birth year] DOA: 02/26/2019  PCP: Patient, No Pcp Per  Patient coming from: home  I have personally briefly reviewed patient's old medical records in [facility]  Chief Complaint: Altered mental status  HPI: Shawn Watkins is a 39 y.o. male with medical history significant of diabetes, alcoholism, was recently seen in the emergency room on January 3 when he tested positive for COVID-19.  At that time, he had nausea and vomiting with body aches, sore throat and nasal congestion.  He was noted to have an anion gap of 22 at that time.  He was encouraged to be admitted to the hospital for treatment of DKA, but he ended up signing out [place].  He is brought back to the hospital today with a change in mental status, increased confusion and shortness of breath.  Upon evaluation in the emergency room, patient is noted to be severely acidotic with a pH of less than 6.9.  Serum bicarb noted to be 4.  Anion gap of 43.  Serum creatinine of 2.9.  He is noted to be febrile, hypotensive and tachypneic.  Patient was intubated in the emergency room due to decline in respiratory status as well as mental status.  He has been hydrated aggressively with IV fluids and has received almost 5 L of fluid thus far.  He was started on vasopressors and femoral central line was placed in the emergency room.  He was started on insulin infusion, intravenous antibiotics.  He has been referred for admission.  Review of Systems: unable to assess due to mental status   Past Medical History:  Diagnosis Date  . Alcoholic hepatitis ~ 9242, 07/2016, 01/2017   course of prednisolone in 07/2016  . Delirium tremens ([place]) 07/2016   also hx ETOH withdrawal seizures prior to 2018  . Depression   . ETOH abuse 07/17/2016  . Pancreatitis 2006, 2017, 07/2016   due to ETOH  . Tobacco abuse 07/17/2016  . Transaminitis 07/17/2016  . Type 2 diabetes mellitus ([place])  02/06/2017    History reviewed. No pertinent surgical history.  Social History:  reports that he has been smoking cigarettes. He started smoking about 12 years ago. He has been smoking about 1.00 pack per day. He has never used smokeless tobacco. He reports current alcohol use. He reports that he does not use drugs.  No Known Allergies  Family history: unable to assess due to mental status  Prior to Admission medications   Medication Sig Start Date End Date Taking? Authorizing Provider  blood glucose meter kit and supplies Dispense based on patient and insurance preference. Use up to four times daily as directed. (FOR ICD-10 E10.9, E11.9). 02/19/17   Irwin Brakeman L, MD  glimepiride (AMARYL) 2 MG tablet Take 1 tablet (2 mg total) by mouth daily with breakfast. 02/19/17 03/21/17  Irwin Brakeman L, MD  ibuprofen (ADVIL,MOTRIN) 200 MG tablet Take 400-600 mg by mouth every 6 (six) hours as needed for moderate pain.    [provider]  Multiple Vitamins-Minerals (MULTIVITAMIN) tablet Take 1 tablet by mouth daily. 02/10/17   Debbe Odea, MD  ondansetron (ZOFRAN ODT) 4 MG disintegrating tablet Take 1 tablet (4 mg total) by mouth every 8 (eight) hours as needed for nausea or vomiting. 02/18/19   Robinson, [place] N, PA-C    Physical Exam: Vitals:   02/26/19 0801 02/26/19 0805 02/26/19 0810 02/26/19 0815  BP: (!) 83/41 (!) 99/34 Marland Kitchen)  88/45 (!) 90/38  Pulse: (!) 144 (!) 144 (!) 143 (!) 143  Resp: (!) 33 (!) 32 (!) 34 (!) 32  Temp: (!) 101.8 F (38.8 C) (!) 101.8 F (38.8 C) (!) 101.8 F (38.8 C) (!) 101.8 F (38.8 C)  TempSrc:      SpO2: 100% 100% 100% 100%  Weight:      Height:        Constitutional: intubated and sedated Eyes: PERRL, lids and conjunctivae normal ENMT: Mucous membranes are dry. Posterior pharynx clear of any exudate or lesions.Normal dentition.  Neck: normal, supple, no masses, no thyromegaly Respiratory: clear to auscultation bilaterally, no wheezing, no  crackles. Increased respiratory rate and effort, breathing over the vent Cardiovascular: tachycardic, no murmurs / rubs / gallops. No extremity edema. 2+ pedal pulses. No carotid bruits.  Abdomen: no tenderness, no masses palpated. No hepatosplenomegaly. Bowel sounds positive.  Musculoskeletal: no clubbing / cyanosis. No joint deformity upper and lower extremities. Good ROM, no contractures. Normal muscle tone.  Skin: no rashes, lesions, ulcers. No induration Neurologic: unable to assess due to sedation Psychiatric: unable to assess   Labs on Admission: I have personally reviewed following labs and imaging studies  CBC: Recent Labs  Lab 02/26/19 0151  WBC 10.3  NEUTROABS 5.3  HGB 14.3  HCT 47.5  MCV 97.3  PLT 737   Basic Metabolic Panel: Recent Labs  Lab 02/26/19 0151 02/26/19 0152 02/26/19 0539  NA 135  --  137  K 4.9  --  5.6*  CL 90*  --  92*  CO2 4*  --  <7*  GLUCOSE 528*  --  372*  BUN 26*  --  30*  CREATININE 2.18*  --  2.97*  CALCIUM 7.1*  --  6.6*  MG  --  2.9*  --    GFR: Estimated Creatinine Clearance: 38.5 mL/min (A) (by C-G formula based on SCr of 2.97 mg/dL (H)). Liver Function Tests: Recent Labs  Lab 02/26/19 0151  AST 504*  ALT 150*  ALKPHOS 197*  BILITOT 1.4*  PROT 6.3*  ALBUMIN 3.2*   Recent Labs  Lab 02/26/19 0409  LIPASE 55*   No results for input(s): AMMONIA in the last 168 hours. Coagulation Profile: No results for input(s): INR, PROTIME in the last 168 hours. Cardiac Enzymes: No results for input(s): CKTOTAL, CKMB, CKMBINDEX, TROPONINI in the last 168 hours. BNP (last 3 results) No results for input(s): PROBNP in the last 8760 hours. HbA1C: No results for input(s): HGBA1C in the last 72 hours. CBG: Recent Labs  Lab 02/26/19 0458 02/26/19 0537 02/26/19 0602 02/26/19 0727 02/26/19 0818  GLUCAP 390* 323* 296* 261* 237*   Lipid Profile: Recent Labs    02/26/19 0151  TRIG 586*   Thyroid Function Tests: No results for  input(s): TSH, T4TOTAL, FREET4, T3FREE, THYROIDAB in the last 72 hours. Anemia Panel: Recent Labs    02/26/19 0151  FERRITIN 750*   Urine analysis:    Component Value Date/Time   COLORURINE YELLOW 02/26/2019 0038   APPEARANCEUR CLEAR 02/26/2019 0038   LABSPEC 1.022 02/26/2019 0038   PHURINE 5.0 02/26/2019 0038   GLUCOSEU >=500 (A) 02/26/2019 0038   HGBUR SMALL (A) 02/26/2019 0038   BILIRUBINUR NEGATIVE 02/26/2019 0038   KETONESUR 20 (A) 02/26/2019 0038   PROTEINUR 100 (A) 02/26/2019 0038   UROBILINOGEN 1.0 06/06/2010 0949   NITRITE NEGATIVE 02/26/2019 0038   LEUKOCYTESUR NEGATIVE 02/26/2019 0038    Radiological Exams on Admission: DG Chest Port 1 [address]  Result Date: 02/26/2019 CLINICAL DATA:  Encephalopathy EXAM: PORTABLE CHEST 1 VIEW COMPARISON:  02/18/2019 FINDINGS: Endotracheal tube tip is at the level of the clavicular heads. The lungs are clear. The cardiomediastinal contours are normal. IMPRESSION: Endotracheal tube tip at the level of the clavicular heads. Electronically Signed   By: Ulyses Jarred M.D.   On: 02/26/2019 01:30    EKG: Independently reviewed. Sinus bradycardia  Assessment/Plan Active Problems:   Severe sepsis with septic shock (HCC)   Alcohol abuse   Lactic acidosis   DKA (diabetic ketoacidoses) (HCC)   Acute respiratory failure with hypoxia (HCC)   Acute metabolic encephalopathy   AKI (acute kidney injury) ([place])   Elevated LFTs     1. Severe diabetic ketoacidosis.  Patient has been aggressively hydrated with IV fluids.  He is on intravenous insulin.  pH remains low.  He is receiving bicarbonate infusion.  Continue to follow blood sugars and serial chemistries.  Anion gap has trended down from 43 to 30. 2. Severe sepsis with septic shock.  Unclear if patient has a component of septic shock.  He is febrile, tachycardic/hypotensive, has an infectious source with COVID-19 and has evidence of endorgan damage with renal failure/lactic acidosis.  He has  been started on intravenous antibiotics.  Procalcitonin is elevated.  Continue antibiotics for now.  Blood cultures have been sent.  Urinalysis does not show any signs of infection. 3. COVID-19 infection.  No clear pneumonia on chest x-ray.  Patient is febrile.  He is critically ill.  He has been started on dexamethasone.  He was considered for remdesivir, but AST noted to be over 500.  Can reconsider if his labs improved. 4. Elevated liver function test.  Likely combination of ischemic injury from hypotension as well as likely a component of alcoholic hepatitis.  Continue to monitor. 5. Acute kidney injury.  Likely related to volume depletion from DKA and hypotension.  Continue aggressive hydration.  If renal function does not recover, may temporarily need CVVHD. 6. Alcohol abuse.  Patient is intubated and sedated.  He is being treated for any potential withdrawal that may develop. 7. Acute respiratory failure with hypoxia.  Main driver of tachypnea is metabolic acidosis.  Would continue with mechanical ventilation until mental status improves. 8. Acute metabolic encephalopathy.  Related to severe acidosis/DKA.  DVT prophylaxis: heparin Code Status: full code  Family Communication: no family present, unable to reach brother on the phone  Disposition Plan: transfer to [facility] for further care  Consults called:   Admission status: inpatient, ICU   Kathie Dike MD [facility]  Critical care procedure note Authorized and performed by: Kathie Dike Total critical care time: Approximately 45 minutes Due to high probability of clinically significant, life-threatening deterioration, the patient required my highest level of preparedness to intervene emergently and I personally spent this critical care time directly and personally managing the patient.  The critical care time included obtaining a history, examining the patient, pulse oximetry, ordering and review of studies, arranging urgent  treatment with development of a management plan, evaluation of patient's response to treatment, frequent reassessment, discussions with other providers.  Critical care time was performed to assess and manage the high probability of imminent, life-threatening deterioration that could result in multiorgan failure.  It was exclusive of separate billable procedures and treating other patients and teaching time.  Please see MDM section and the rest of the of note for further information on patient assessment and treatment  If 7PM-7AM, please contact night-coverage [email or web address]  02/26/2019, 8:42 AM

## 2019-02-26 NOTE — ED Notes (Signed)
Date and time results received: 02/26/19 0222 (use smartphrase ".now" to insert current time)  Test: lactic acid Critical Value: 11.0  Name of Provider Notified: Dr Lynelle Doctor  Orders Received? Or Actions Taken?: see chart

## 2019-02-27 ENCOUNTER — Inpatient Hospital Stay (HOSPITAL_COMMUNITY): Payer: HRSA Program

## 2019-02-27 DIAGNOSIS — E1010 Type 1 diabetes mellitus with ketoacidosis without coma: Secondary | ICD-10-CM

## 2019-02-27 DIAGNOSIS — L8990 Pressure ulcer of unspecified site, unspecified stage: Secondary | ICD-10-CM | POA: Insufficient documentation

## 2019-02-27 LAB — POCT I-STAT 7, (LYTES, BLD GAS, ICA,H+H)
Acid-Base Excess: 3 mmol/L — ABNORMAL HIGH (ref 0.0–2.0)
Bicarbonate: 25.8 mmol/L (ref 20.0–28.0)
Calcium, Ion: 0.88 mmol/L — CL (ref 1.15–1.40)
HCT: 40 % (ref 39.0–52.0)
Hemoglobin: 13.6 g/dL (ref 13.0–17.0)
O2 Saturation: 89 %
Patient temperature: 103.1
Potassium: 3.2 mmol/L — ABNORMAL LOW (ref 3.5–5.1)
Sodium: 142 mmol/L (ref 135–145)
TCO2: 27 mmol/L (ref 22–32)
pCO2 arterial: 38 mmHg (ref 32.0–48.0)
pH, Arterial: 7.449 (ref 7.350–7.450)
pO2, Arterial: 61 mmHg — ABNORMAL LOW (ref 83.0–108.0)

## 2019-02-27 LAB — LACTIC ACID, PLASMA
Lactic Acid, Venous: 9.2 mmol/L (ref 0.5–1.9)
Lactic Acid, Venous: 5.4 mmol/L (ref 0.5–1.9)

## 2019-02-27 LAB — CBC WITH DIFFERENTIAL/PLATELET
Abs Immature Granulocytes: 2.29 10*3/uL — ABNORMAL HIGH (ref 0.00–0.07)
Basophils Absolute: 0.2 10*3/uL — ABNORMAL HIGH (ref 0.0–0.1)
Basophils Relative: 1 %
Eosinophils Absolute: 0.3 10*3/uL (ref 0.0–0.5)
Eosinophils Relative: 1 %
HCT: 36.1 % — ABNORMAL LOW (ref 39.0–52.0)
Hemoglobin: 12.6 g/dL — ABNORMAL LOW (ref 13.0–17.0)
Immature Granulocytes: 7 %
Lymphocytes Relative: 5 %
Lymphs Abs: 1.6 10*3/uL (ref 0.7–4.0)
MCH: 29.2 pg (ref 26.0–34.0)
MCHC: 34.9 g/dL (ref 30.0–36.0)
MCV: 83.8 fL (ref 80.0–100.0)
Monocytes Absolute: 0.3 10*3/uL (ref 0.1–1.0)
Monocytes Relative: 1 %
Neutro Abs: 26.6 10*3/uL — ABNORMAL HIGH (ref 1.7–7.7)
Neutrophils Relative %: 85 %
Platelets: 71 10*3/uL — ABNORMAL LOW (ref 150–400)
RBC: 4.31 MIL/uL (ref 4.22–5.81)
RDW: 12.8 % (ref 11.5–15.5)
WBC: 31.2 10*3/uL — ABNORMAL HIGH (ref 4.0–10.5)
nRBC: 0 % (ref 0.0–0.2)

## 2019-02-27 LAB — BASIC METABOLIC PANEL
Anion gap: 18 — ABNORMAL HIGH (ref 5–15)
Anion gap: 16 — ABNORMAL HIGH (ref 5–15)
BUN: 41 mg/dL — ABNORMAL HIGH (ref 6–20)
BUN: 44 mg/dL — ABNORMAL HIGH (ref 6–20)
CO2: 28 mmol/L (ref 22–32)
CO2: 27 mmol/L (ref 22–32)
Calcium: 6.5 mg/dL — ABNORMAL LOW (ref 8.9–10.3)
Calcium: 6 mg/dL — CL (ref 8.9–10.3)
Chloride: 97 mmol/L — ABNORMAL LOW (ref 98–111)
Chloride: 98 mmol/L (ref 98–111)
Creatinine, Ser: 3.95 mg/dL — ABNORMAL HIGH (ref 0.61–1.24)
Creatinine, Ser: 4.63 mg/dL — ABNORMAL HIGH (ref 0.61–1.24)
GFR calc Af Amer: 21 mL/min — ABNORMAL LOW (ref >60)
GFR calc Af Amer: 17 mL/min — ABNORMAL LOW (ref >60)
GFR calc non Af Amer: 18 mL/min — ABNORMAL LOW (ref >60)
GFR calc non Af Amer: 15 mL/min — ABNORMAL LOW (ref >60)
Glucose, Bld: 119 mg/dL — ABNORMAL HIGH (ref 70–99)
Glucose, Bld: 121 mg/dL — ABNORMAL HIGH (ref 70–99)
Potassium: 3.4 mmol/L — ABNORMAL LOW (ref 3.5–5.1)
Potassium: 3.8 mmol/L (ref 3.5–5.1)
Sodium: 143 mmol/L (ref 135–145)
Sodium: 141 mmol/L (ref 135–145)

## 2019-02-27 LAB — COMPREHENSIVE METABOLIC PANEL
ALT: 240 U/L — ABNORMAL HIGH (ref 0–44)
AST: 883 U/L — ABNORMAL HIGH (ref 15–41)
Albumin: 2.1 g/dL — ABNORMAL LOW (ref 3.5–5.0)
Alkaline Phosphatase: 144 U/L — ABNORMAL HIGH (ref 38–126)
Anion gap: 20 — ABNORMAL HIGH (ref 5–15)
BUN: 43 mg/dL — ABNORMAL HIGH (ref 6–20)
CO2: 27 mmol/L (ref 22–32)
Calcium: 6.3 mg/dL — CL (ref 8.9–10.3)
Chloride: 96 mmol/L — ABNORMAL LOW (ref 98–111)
Creatinine, Ser: 4.2 mg/dL — ABNORMAL HIGH (ref 0.61–1.24)
GFR calc Af Amer: 20 mL/min — ABNORMAL LOW (ref >60)
GFR calc non Af Amer: 17 mL/min — ABNORMAL LOW (ref >60)
Glucose, Bld: 98 mg/dL (ref 70–99)
Potassium: 3.7 mmol/L (ref 3.5–5.1)
Sodium: 143 mmol/L (ref 135–145)
Total Bilirubin: 1.6 mg/dL — ABNORMAL HIGH (ref 0.3–1.2)
Total Protein: 4.1 g/dL — ABNORMAL LOW (ref 6.5–8.1)

## 2019-02-27 LAB — GLUCOSE, CAPILLARY
Glucose-Capillary: 106 mg/dL — ABNORMAL HIGH (ref 70–99)
Glucose-Capillary: 109 mg/dL — ABNORMAL HIGH (ref 70–99)
Glucose-Capillary: 111 mg/dL — ABNORMAL HIGH (ref 70–99)
Glucose-Capillary: 113 mg/dL — ABNORMAL HIGH (ref 70–99)
Glucose-Capillary: 118 mg/dL — ABNORMAL HIGH (ref 70–99)
Glucose-Capillary: 118 mg/dL — ABNORMAL HIGH (ref 70–99)
Glucose-Capillary: 126 mg/dL — ABNORMAL HIGH (ref 70–99)
Glucose-Capillary: 130 mg/dL — ABNORMAL HIGH (ref 70–99)
Glucose-Capillary: 84 mg/dL (ref 70–99)
Glucose-Capillary: 92 mg/dL (ref 70–99)
Glucose-Capillary: 99 mg/dL (ref 70–99)

## 2019-02-27 LAB — URINE CULTURE
Culture: NO GROWTH
Special Requests: NORMAL

## 2019-02-27 LAB — FERRITIN: Ferritin: 3332 ng/mL — ABNORMAL HIGH (ref 24–336)

## 2019-02-27 LAB — HEMOGLOBIN A1C
Hgb A1c MFr Bld: 12.2 % — ABNORMAL HIGH (ref 4.8–5.6)
Mean Plasma Glucose: 303.44 mg/dL

## 2019-02-27 LAB — BETA-HYDROXYBUTYRIC ACID: Beta-Hydroxybutyric Acid: 0.27 mmol/L (ref 0.05–0.27)

## 2019-02-27 LAB — MRSA PCR SCREENING: MRSA by PCR: NEGATIVE

## 2019-02-27 LAB — C-REACTIVE PROTEIN: CRP: 9 mg/dL — ABNORMAL HIGH (ref <1.0)

## 2019-02-27 LAB — D-DIMER, QUANTITATIVE: D-Dimer, Quant: 7.6 ug/mL-FEU — ABNORMAL HIGH (ref 0.00–0.50)

## 2019-02-27 LAB — LIPASE, BLOOD: Lipase: 18 U/L (ref 11–51)

## 2019-02-27 MED ORDER — SODIUM CHLORIDE 0.9 % IV SOLN
2.0000 g | INTRAVENOUS | Status: DC
  Administered 2019-02-28 – 2019-03-02 (×3): 2 g via INTRAVENOUS
  Filled 2019-02-27 (×3): qty 2

## 2019-02-27 MED ORDER — CALCIUM GLUCONATE-NACL 1-0.675 GM/50ML-% IV SOLN
1.0000 g | Freq: Once | INTRAVENOUS | Status: AC
  Administered 2019-02-27: 1000 mg via INTRAVENOUS
  Filled 2019-02-27: qty 50

## 2019-02-27 MED ORDER — INSULIN GLARGINE 100 UNIT/ML ~~LOC~~ SOLN
15.0000 [IU] | Freq: Every day | SUBCUTANEOUS | Status: DC
  Filled 2019-02-27: qty 0.15

## 2019-02-27 MED ORDER — LACTATED RINGERS IV BOLUS
1000.0000 mL | Freq: Once | INTRAVENOUS | Status: AC
  Administered 2019-02-27: 06:00:00 1000 mL via INTRAVENOUS

## 2019-02-27 MED ORDER — INSULIN DETEMIR 100 UNIT/ML ~~LOC~~ SOLN
11.0000 [IU] | SUBCUTANEOUS | Status: AC
  Administered 2019-02-27: 09:00:00 11 [IU] via SUBCUTANEOUS
  Filled 2019-02-27: qty 0.11

## 2019-02-27 MED ORDER — FOLIC ACID 1 MG PO TABS
1.0000 mg | ORAL_TABLET | Freq: Every day | ORAL | Status: DC
  Administered 2019-02-27 – 2019-03-05 (×7): 1 mg
  Filled 2019-02-27 (×7): qty 1

## 2019-02-27 MED ORDER — THIAMINE HCL 100 MG PO TABS
100.0000 mg | ORAL_TABLET | Freq: Every day | ORAL | Status: DC
  Administered 2019-02-27 – 2019-03-05 (×7): 100 mg
  Filled 2019-02-27 (×7): qty 1

## 2019-02-27 MED ORDER — INSULIN ASPART 100 UNIT/ML ~~LOC~~ SOLN
0.0000 [IU] | SUBCUTANEOUS | Status: DC
  Administered 2019-02-27: 1 [IU] via SUBCUTANEOUS

## 2019-02-27 MED ORDER — LACTATED RINGERS IV BOLUS
1000.0000 mL | Freq: Once | INTRAVENOUS | Status: AC
  Administered 2019-02-27: 13:00:00 1000 mL via INTRAVENOUS

## 2019-02-27 MED ORDER — INSULIN ASPART 100 UNIT/ML ~~LOC~~ SOLN
0.0000 [IU] | SUBCUTANEOUS | Status: DC
  Administered 2019-02-28: 3 [IU] via SUBCUTANEOUS

## 2019-02-27 MED ORDER — SODIUM CHLORIDE 0.9 % IV SOLN: INTRAVENOUS | Status: DC

## 2019-02-27 MED ORDER — HEPARIN SODIUM (PORCINE) 5000 UNIT/ML IJ SOLN
7500.0000 [IU] | Freq: Three times a day (TID) | INTRAMUSCULAR | Status: DC
  Administered 2019-02-27 – 2019-02-28 (×3): 7500 [IU] via SUBCUTANEOUS
  Filled 2019-02-27 (×3): qty 2

## 2019-02-27 MED FILL — Medication: Qty: 1 | Status: AC

## 2019-02-27 NOTE — Progress Notes (Signed)
CRITICAL VALUE ALERT  Critical Value:  Ca 6.0  Date & Time Notied:  02/27/2019 1830  Provider Notified: MD Vassie Loll  Orders Received/Actions taken: Awaiting orders

## 2019-02-27 NOTE — Progress Notes (Signed)
[facility] Physician-Brief Progress Note Patient Name: Shawn Watkins DOB: [birth year] MRN: [id number]   Date of Service  02/27/2019  HPI/Events of Note  Bedside RN calling to report increasing pressor demands.  Wondering if stat CT abd still needs to be done tonight.  Pt on max levo gtt, vasopressin, low dose neo gtt.   eICU Interventions  Place aline now per RT.  Send stat chem, cbc, repeat lactate, ABG.   Check CVP.   Need to attempt CT abd to r/o intra-abd source of ongoing shock.      Intervention Category Major Interventions: Shock - evaluation and management  Danford Bad 02/27/2019, 10:16 PM

## 2019-02-27 NOTE — Progress Notes (Signed)
Discussed the patients current medical status with both the patients farther and mother today.

## 2019-02-27 NOTE — Progress Notes (Signed)
Patient transported to CT and back o 204-1 without incidence.

## 2019-02-27 NOTE — Progress Notes (Signed)
[facility] Physician-Brief Progress Note Patient Name: Shawn Watkins DOB: [birth year] MRN: [id number]   Date of Service  02/27/2019  HPI/Events of Note  Repeat Lactate 9. Pt with some vomiting.  eICU Interventions  1000 ml iv fluid bolus (LR), Stat CT abdomen/pelvis w/o contrast to r/o an acute abdomen.        [name] [name] 02/27/2019, 5:17 AM

## 2019-02-27 NOTE — Progress Notes (Signed)
2130-Liz with e[facility], stated that Dr Vassie Loll called and asked if we could go ahead and get pt's abdominal CT done tonight since he has a persistent lactic acidosis. Lactic resulted at 1500 today.  This nurse informed [facility] that pt is requiring increase pressor demand and asked if CT could wait until the morning. Per Dr Wonda Amis, CT scan needs to be done tonight. Also orders for arterial line and PVC monitoring entered.   2246-Charge nurse and float nurse, along with RT to transport pt to radiology for scan.

## 2019-02-27 NOTE — Progress Notes (Addendum)
NAME:  Shawn Watkins, MRN:  [id number], DOB:  05-Aug-1980, LOS: 1 ADMISSION DATE:  02/26/2019, CONSULTATION DATE:  1/11 REFERRING MD:  Dr. Roderic Palau, CHIEF COMPLAINT:  AMS, DKA, COVID   Brief History   39 y/o M with DM, ETOH who was recently seen on 1/3 with N/V, sore throat, body aches.  He was diagnosed with COVID at that time but left AMA.  He returned to [facility] 1/11 with altered mental status & increased work of breathing.  He was found to be in DKA and intubated due to altered mental status.  Tx to [facility] 1/11 for further care.   Adm labs  pH of 6.9, bicarb 4, AG 43, Sr Cr 2.9 and fever to 101.   Past Medical History  DM II ETOH Abuse - prior alcoholic hepatitis, DT's with ETOH withdrawal seizures, pancreatitis  Depression   Significant Hospital Events   1/03 Seen in [facility] ER, dx with COVID, left AMA  1/11 Admitted with DKA, AMS, intubated.  Tx to [facility] from [facility] 1/11 hypotensive on 3 pressors  Consults:  TRH  PCCM   Procedures:  ETT 1/11 >>  R Fem TLC 1/11 >>   Significant Diagnostic Tests:  UDS 1/11 (post arrival to [facility]) >> pos BDZ ECHO 1/11 >> nml LV fn  Micro Data:  COVID 1/03 >> positive  BCx2 1/11 >>  UA 1/11 >> negative, + glucose UC 1/11 >> ng  Antimicrobials:  Vanco 1/11 >>  Cefepime 1/11 >>    Interim history/subjective:   Remains hypotensive on 3 pressors, minimal urine output Some breakthrough agitation on Versed and fentanyl drips Low-grade febrile  Objective   Blood pressure 95/69, pulse (!) 111, temperature 100.2 F (37.9 C), temperature source Oral, resp. rate (!) 25, height 6\' 1"  (1.854 m), weight 91.4 kg, SpO2 93 %.    Vent Mode: PRVC FiO2 (%):  [40 %-50 %] 40 % Set Rate:  [25 bmp] 25 bmp Vt Set:  [560 mL] 560 mL PEEP:  [5 cmH20] 5 cmH20 Plateau Pressure:  [12 cmH20-16 cmH20] 16 cmH20   Intake/Output Summary (Last 24 hours) at 02/27/2019 1435 Last data filed at 02/27/2019 1424 Gross per 24 hour  Intake 4613.26 ml  Output 1451 ml  Net 3162.26 ml     Filed Weights   02/26/19 0030 02/27/19 0456  Weight: 81.6 kg 91.4 kg    Examination: General: thin adult male  on vent in NAD, critically ill appearing HEENT: Dry mucosa, ETT, pupils 76mm/reactive  Neuro: sedate, opens eyes to voice, intermittent agitation CV: s1s2 RRR, ST on monitor, no m/r/g PULM: Acidotic breathing has resolved, no accessory muscle use, bilateral ventilated breath sounds GI: soft, bsx4 active  Extremities: warm/dry, no edema  Skin: multiple areas of acne on thighs, no breakdown / ulcerations  Chest x-ray 1/12 personally reviewed which shows clear lung fields, no new infiltrates  Labs show resolved beta hydroxybutyrate, rising creatinine, anion gap of 20 although bicarbonate 27, increasing leukocytosis, worsening LFTs and thrombocytopenia  Resolved Hospital Problem list      Assessment & Plan:   DKA  Turns out that he is an insulin requiring diabetic after all and was on home Lantus -Since beta hydroxybutyrate is resolved, even though anion gap persists, DKA appears to have resolved, will titrate off insulin drip after giving Levemir -Restart home Lantus dose 15 units at bedtime -dc bicarb gtt  -follow BMP  -hold home glimepiride   Shock  Initially felt to be volume depletion with DKA /  hx ETOH use, possible sepsis with COVID, elevated PCT.  Troponin flat, echo normal -ct levophed, neosynephrine for MAP >65 -ct vasopressin -follow cultures & continue empiric abx -Repeat lactate, if high proceed with CT abdomen   Acute Respiratory Failure  Tobacco Abuse  In setting of metabolic encephalopathy, DKA -PRVC 8cc/kg, rate 25 -Proceed with spontaneous breathing trials and plan for extubation once shock resolves  COVID-19 Infection  Positive on 1/3.  CXR clear on admit.  -follow LFT's, may be able to re-consider for remdesivir if LFTs improve -continue decadron -will need aggressive control of sugars  Elevated LFT's  Suspect component of shock liver,  ETOH use / alcoholic hepatitis  -assess acute hepatitis panel   AKI  Hypocalcemia Acute Metabolic Acidosis  In setting of volume depletion, hypotension -Trend BMP / urinary output -Replace electrolytes as indicated, 1 gm calcium  -Avoid nephrotoxins  ETOH Abuse  Positive on admit.  At risk for withdrawal - versed gtt + fent gtt , goal RASS -3 -monitor for withdrawal symptoms  -ETOH abuse counseling when able    Summary -DKA has resolved but shock is persistent which is concerning.  Does not appear to be cardiogenic, if persistent lactate then will investigate for ischemic bowel   Best practice:  Diet: NPO  Pain/Anxiety/Delirium protocol (if indicated):  Fentanyl,  versed gtt VAP protocol (if indicated): In place  DVT prophylaxis: Heparin SQ  GI prophylaxis: PPI  Glucose control: SSI with Lantus Mobility: BR  Code Status: Full Code  Family Communication: father Jorja Loa 1/12 is contact , mom Marylu Lund is in [place] & is not involved Disposition: ICU     The patient is critically ill with multiple organ systems failure and requires high complexity decision making for assessment and support, frequent evaluation and titration of therapies, application of advanced monitoring technologies and extensive interpretation of multiple databases. Critical Care Time devoted to patient care services described in this note independent of APP/resident  time is 35 minutes.   Cyril Mourning MD. Tonny Bollman. [place] [facility]  If no response to pager , please call [phone]   02/27/2019

## 2019-02-27 NOTE — Progress Notes (Signed)
Initial Nutrition Assessment RD working remotely.  DOCUMENTATION CODES:   Not applicable  INTERVENTION:   If okay to use gut pending results of lactic acid and CT abdomen (if required), recommend begin TF via OGT:   Vital AF 1.2 at 80 ml/h (1920 ml per day).   Provides 2304 kcal, 144 gm protein, 1557 ml free water daily.  NUTRITION DIAGNOSIS:   Increased nutrient needs related to acute illness(COVID) as evidenced by estimated needs.  GOAL:   Patient will meet greater than or equal to 90% of their needs  MONITOR:   Vent status, Labs, Weight trends, Skin, I & O's  REASON FOR ASSESSMENT:   Ventilator, Consult Assessment of nutrition requirement/status, Enteral/tube feeding initiation and management  ASSESSMENT:   39 yo male admitted with AMS and increased WOB, DKA. Required intubation on admission. COVID positive on 1/3. PMH includes DM (on insulin at home), ETOH abuse, pancreatitis, depression, alcoholic hepatitis.   Admission UDS positive for benzos.   Patient is currently intubated on ventilator support, currently requiring 3 pressors. MV: 17.6 L/min Temp (24hrs), Avg:100.4 F (38 C), Min:98.5 F (36.9 C), Max:103.1 F (39.5 C)   Labs reviewed. Corrected calcium 7.82 (L), creatinine 4.2 (H) CBG's: 118-99-111-118-126  DKA has resolved.  Lactic acid 9.2 to >11 (02/26/19). Lactic acid being repeated, may require CT abdomen to assess for ischemic bowel.  Medications reviewed and include decadron, folic acid, novolog, lantus, thiamine, levophed, phenylephrine, vasopressin, calcium gluconate. Insulin drip is being titrated off today.   Current weight is 12% above weight from 02/18/19, likely related to positive volume status.  I/O +10.4 L  NUTRITION - FOCUSED PHYSICAL EXAM:  unable to complete due to COVID restrictions  Diet Order:   Diet Order            Diet NPO time specified  Diet effective now              EDUCATION NEEDS:   Not appropriate for  education at this time  Skin:  Skin Assessment: Skin Integrity Issues: Skin Integrity Issues:: Stage I Stage I: sacrum  Last BM:  1/11  Height:   Ht Readings from Last 1 Encounters:  02/26/19 6\' 1"  (1.854 m)    Weight:   Wt Readings from Last 1 Encounters:  02/27/19 91.4 kg    Ideal Body Weight:  83.6 kg  BMI:  Body mass index is 26.58 kg/m.  Estimated Nutritional Needs:   Kcal:  2375  Protein:  140-165 gm  Fluid:  >/= 2.4 L     04/27/19, RD, LDN, CNSC Pager [phone] After Hours Pager [phone]

## 2019-02-27 NOTE — Progress Notes (Signed)
Inpatient Diabetes Program Recommendations  AACE/ADA: New Consensus Statement on Inpatient Glycemic Control (2015)  Target Ranges:  Prepandial:   less than 140 mg/dL      Peak postprandial:   less than 180 mg/dL (1-2 hours)      Critically ill patients:  140 - 180 mg/dL   Lab Results  Component Value Date   GLUCAP 118 (H) 02/27/2019   HGBA1C 9.8 (H) 02/06/2017    Review of Glycemic Control Results for Shawn Watkins, Shawn Watkins (MRN [id number]) as of 02/27/2019 10:49  Ref. Range 02/27/2019 04:10 02/27/2019 05:13 02/27/2019 05:26 02/27/2019 06:27 02/27/2019 08:06  Glucose-Capillary Latest Ref Range: 70 - 99 mg/dL 253 (H) 664 (H) 99 403 (H) 118 (H)   Diabetes history: DM 2 Outpatient Diabetes medications:  Lantus 15 units q HS, Admelog 5 units tid with meals Current orders for Inpatient glycemic control:  Decadron 6 mg daily IV insulin- transitioning off IV insulin to SQ Levemir  Inpatient Diabetes Program Recommendations:   Please add Novolog sensitive q 4 hours.    Thanks,  Beryl Meager, RN, BC-ADM Inpatient Diabetes Coordinator Pager [phone] (8a-5p)

## 2019-02-27 NOTE — Progress Notes (Signed)
Pharmacy Antibiotic Note  Shawn Watkins is a 39 y.o. male admitted on 02/26/2019 with sepsis.  Pharmacy has been consulted for Cefepime and Vancomycin dosing.  SCr 0.61>2.97 > 4.2 WBC 10.3 > 31.2  Plan: Decrease to Cefepime 2gm IV q24h Vancomycin 1750 mg IV x1 dose.  Further vancomycin dosing per renal function. Goal AUC 400-550.   Will f/u renal function, micro data, and pt's clinical condition Vanc random level at 48 hours.   Height: 6\' 1"  (185.4 cm) Weight: 201 lb 8 oz (91.4 kg) IBW/kg (Calculated) : 79.9  Temp (24hrs), Avg:100.4 F (38 C), Min:98.5 F (36.9 C), Max:103.1 F (39.5 C)  Recent Labs  Lab 02/26/19 0001 02/26/19 0150 02/26/19 0151 02/26/19 0539 02/26/19 1600 02/26/19 1815 02/26/19 2055 02/27/19 0000 02/27/19 0512  WBC  --   --  10.3  --   --   --   --   --  31.2*  CREATININE  --   --  2.18* 2.97* 3.55*  --  3.72* 3.95* 4.20*  LATICACIDVEN 9.2* >11.0*  --  >11.0*  --  >11.0*  --   --   --     Estimated Creatinine Clearance: 27.2 mL/min (A) (by C-G formula based on SCr of 4.2 mg/dL (H)).    No Known Allergies  Antimicrobials this admission: 1/11 Cefepime >>  1/11 Vanc >>   Dose adjustments this admission:  1/13 Vanc level:    Microbiology results: 1/11 BCx: ngtd 1/11 UCx:   MRSA PCR:  Thank you for allowing pharmacy to be a part of this patient's care.  3/11 PharmD, BCPS Clinical pharmacist phone 7am- 5pm: [phone] 02/27/2019 3:32 PM

## 2019-02-28 DIAGNOSIS — E872 Acidosis: Secondary | ICD-10-CM

## 2019-02-28 DIAGNOSIS — E1011 Type 1 diabetes mellitus with ketoacidosis with coma: Secondary | ICD-10-CM

## 2019-02-28 LAB — CBC WITH DIFFERENTIAL/PLATELET
Abs Immature Granulocytes: 0.26 10*3/uL — ABNORMAL HIGH (ref 0.00–0.07)
Basophils Absolute: 0 10*3/uL (ref 0.0–0.1)
Basophils Relative: 0 %
Eosinophils Absolute: 0 10*3/uL (ref 0.0–0.5)
Eosinophils Relative: 0 %
HCT: 30.5 % — ABNORMAL LOW (ref 39.0–52.0)
Hemoglobin: 11 g/dL — ABNORMAL LOW (ref 13.0–17.0)
Immature Granulocytes: 1 %
Lymphocytes Relative: 3 %
Lymphs Abs: 0.8 10*3/uL (ref 0.7–4.0)
MCH: 29.6 pg (ref 26.0–34.0)
MCHC: 36.1 g/dL — ABNORMAL HIGH (ref 30.0–36.0)
MCV: 82.2 fL (ref 80.0–100.0)
Monocytes Absolute: 0.4 10*3/uL (ref 0.1–1.0)
Monocytes Relative: 1 %
Neutro Abs: 27.2 10*3/uL — ABNORMAL HIGH (ref 1.7–7.7)
Neutrophils Relative %: 95 %
Platelets: 32 10*3/uL — ABNORMAL LOW (ref 150–400)
RBC: 3.71 MIL/uL — ABNORMAL LOW (ref 4.22–5.81)
RDW: 12.9 % (ref 11.5–15.5)
WBC: 28.6 10*3/uL — ABNORMAL HIGH (ref 4.0–10.5)
nRBC: 0 % (ref 0.0–0.2)

## 2019-02-28 LAB — POCT I-STAT 7, (LYTES, BLD GAS, ICA,H+H)
Acid-Base Excess: 1 mmol/L (ref 0.0–2.0)
Bicarbonate: 24.3 mmol/L (ref 20.0–28.0)
Calcium, Ion: 0.81 mmol/L — CL (ref 1.15–1.40)
HCT: 29 % — ABNORMAL LOW (ref 39.0–52.0)
Hemoglobin: 9.9 g/dL — ABNORMAL LOW (ref 13.0–17.0)
O2 Saturation: 90 %
Patient temperature: 37.8
Potassium: 3.8 mmol/L (ref 3.5–5.1)
Sodium: 143 mmol/L (ref 135–145)
TCO2: 25 mmol/L (ref 22–32)
pCO2 arterial: 32.1 mmHg (ref 32.0–48.0)
pH, Arterial: 7.49 — ABNORMAL HIGH (ref 7.350–7.450)
pO2, Arterial: 54 mmHg — ABNORMAL LOW (ref 83.0–108.0)

## 2019-02-28 LAB — GLUCOSE, CAPILLARY
Glucose-Capillary: 102 mg/dL — ABNORMAL HIGH (ref 70–99)
Glucose-Capillary: 119 mg/dL — ABNORMAL HIGH (ref 70–99)
Glucose-Capillary: 128 mg/dL — ABNORMAL HIGH (ref 70–99)
Glucose-Capillary: 137 mg/dL — ABNORMAL HIGH (ref 70–99)
Glucose-Capillary: 139 mg/dL — ABNORMAL HIGH (ref 70–99)
Glucose-Capillary: 62 mg/dL — ABNORMAL LOW (ref 70–99)

## 2019-02-28 LAB — COMPREHENSIVE METABOLIC PANEL
ALT: 210 U/L — ABNORMAL HIGH (ref 0–44)
AST: 559 U/L — ABNORMAL HIGH (ref 15–41)
Albumin: 2.3 g/dL — ABNORMAL LOW (ref 3.5–5.0)
Alkaline Phosphatase: 156 U/L — ABNORMAL HIGH (ref 38–126)
Anion gap: 16 — ABNORMAL HIGH (ref 5–15)
BUN: 57 mg/dL — ABNORMAL HIGH (ref 6–20)
CO2: 27 mmol/L (ref 22–32)
Calcium: 6.2 mg/dL — CL (ref 8.9–10.3)
Chloride: 99 mmol/L (ref 98–111)
Creatinine, Ser: 5.63 mg/dL — ABNORMAL HIGH (ref 0.61–1.24)
GFR calc Af Amer: 14 mL/min — ABNORMAL LOW (ref >60)
GFR calc non Af Amer: 12 mL/min — ABNORMAL LOW (ref >60)
Glucose, Bld: 118 mg/dL — ABNORMAL HIGH (ref 70–99)
Potassium: 4.2 mmol/L (ref 3.5–5.1)
Sodium: 142 mmol/L (ref 135–145)
Total Bilirubin: 1.4 mg/dL — ABNORMAL HIGH (ref 0.3–1.2)
Total Protein: 4.5 g/dL — ABNORMAL LOW (ref 6.5–8.1)

## 2019-02-28 LAB — BASIC METABOLIC PANEL
Anion gap: 15 (ref 5–15)
BUN: 54 mg/dL — ABNORMAL HIGH (ref 6–20)
CO2: 27 mmol/L (ref 22–32)
Calcium: 6 mg/dL — CL (ref 8.9–10.3)
Chloride: 100 mmol/L (ref 98–111)
Creatinine, Ser: 5.55 mg/dL — ABNORMAL HIGH (ref 0.61–1.24)
GFR calc Af Amer: 14 mL/min — ABNORMAL LOW (ref >60)
GFR calc non Af Amer: 12 mL/min — ABNORMAL LOW (ref >60)
Glucose, Bld: 80 mg/dL (ref 70–99)
Potassium: 3.9 mmol/L (ref 3.5–5.1)
Sodium: 142 mmol/L (ref 135–145)

## 2019-02-28 LAB — CBC
HCT: 31.4 % — ABNORMAL LOW (ref 39.0–52.0)
Hemoglobin: 11.3 g/dL — ABNORMAL LOW (ref 13.0–17.0)
MCH: 29.6 pg (ref 26.0–34.0)
MCHC: 36 g/dL (ref 30.0–36.0)
MCV: 82.2 fL (ref 80.0–100.0)
Platelets: 40 10*3/uL — ABNORMAL LOW (ref 150–400)
RBC: 3.82 MIL/uL — ABNORMAL LOW (ref 4.22–5.81)
RDW: 12.8 % (ref 11.5–15.5)
WBC: 30.5 10*3/uL — ABNORMAL HIGH (ref 4.0–10.5)
nRBC: 0 % (ref 0.0–0.2)

## 2019-02-28 LAB — FERRITIN: Ferritin: 2047 ng/mL — ABNORMAL HIGH (ref 24–336)

## 2019-02-28 LAB — C-REACTIVE PROTEIN: CRP: 19.1 mg/dL — ABNORMAL HIGH (ref <1.0)

## 2019-02-28 LAB — VANCOMYCIN, RANDOM: Vancomycin Rm: 17

## 2019-02-28 LAB — LACTIC ACID, PLASMA
Lactic Acid, Venous: 5 mmol/L (ref 0.5–1.9)
Lactic Acid, Venous: 3.8 mmol/L (ref 0.5–1.9)

## 2019-02-28 LAB — D-DIMER, QUANTITATIVE: D-Dimer, Quant: 3.79 ug/mL-FEU — ABNORMAL HIGH (ref 0.00–0.50)

## 2019-02-28 MED ORDER — VITAL HIGH PROTEIN PO LIQD
1000.0000 mL | ORAL | Status: DC
  Administered 2019-02-28: 1000 mL

## 2019-02-28 MED ORDER — INSULIN GLARGINE 100 UNIT/ML ~~LOC~~ SOLN: 5.0000 [IU] | Freq: Every day | SUBCUTANEOUS | Status: DC

## 2019-02-28 MED ORDER — CALCIUM GLUCONATE-NACL 1-0.675 GM/50ML-% IV SOLN
1.0000 g | Freq: Once | INTRAVENOUS | Status: AC
  Administered 2019-02-28: 1000 mg via INTRAVENOUS
  Filled 2019-02-28: qty 50

## 2019-02-28 MED ORDER — INSULIN GLARGINE 100 UNIT/ML ~~LOC~~ SOLN
5.0000 [IU] | Freq: Every day | SUBCUTANEOUS | Status: DC
  Administered 2019-02-28 – 2019-03-13 (×13): 5 [IU] via SUBCUTANEOUS
  Filled 2019-02-28 (×14): qty 0.05

## 2019-02-28 MED ORDER — ALBUMIN HUMAN 25 % IV SOLN
25.0000 g | Freq: Once | INTRAVENOUS | Status: AC
  Administered 2019-02-28: 25 g via INTRAVENOUS
  Filled 2019-02-28: qty 50

## 2019-02-28 MED ORDER — SODIUM CHLORIDE 0.9 % BOLUS PEDS
500.0000 mL | Freq: Once | INTRAVENOUS | Status: AC
  Administered 2019-02-28: 500 mL via INTRAVENOUS

## 2019-02-28 MED ORDER — INSULIN ASPART 100 UNIT/ML ~~LOC~~ SOLN
0.0000 [IU] | SUBCUTANEOUS | Status: DC
  Administered 2019-02-28 (×2): 1 [IU] via SUBCUTANEOUS
  Administered 2019-03-01 – 2019-03-02 (×8): 2 [IU] via SUBCUTANEOUS
  Administered 2019-03-02: 3 [IU] via SUBCUTANEOUS
  Administered 2019-03-02: 1 [IU] via SUBCUTANEOUS
  Administered 2019-03-02: 5 [IU] via SUBCUTANEOUS
  Administered 2019-03-02 – 2019-03-03 (×2): 2 [IU] via SUBCUTANEOUS
  Administered 2019-03-03: 5 [IU] via SUBCUTANEOUS
  Administered 2019-03-03 (×2): 3 [IU] via SUBCUTANEOUS
  Administered 2019-03-03 – 2019-03-04 (×4): 2 [IU] via SUBCUTANEOUS
  Administered 2019-03-05 (×2): 1 [IU] via SUBCUTANEOUS
  Administered 2019-03-05: 2 [IU] via SUBCUTANEOUS
  Administered 2019-03-05 – 2019-03-06 (×4): 1 [IU] via SUBCUTANEOUS
  Administered 2019-03-07 (×2): 2 [IU] via SUBCUTANEOUS
  Administered 2019-03-07: 1 [IU] via SUBCUTANEOUS
  Administered 2019-03-08: 5 [IU] via SUBCUTANEOUS
  Administered 2019-03-08 (×2): 3 [IU] via SUBCUTANEOUS
  Administered 2019-03-08: 2 [IU] via SUBCUTANEOUS
  Administered 2019-03-09: 1 [IU] via SUBCUTANEOUS

## 2019-02-28 MED ORDER — VITAL AF 1.2 CAL PO LIQD
1000.0000 mL | ORAL | Status: DC
  Administered 2019-02-28 – 2019-03-01 (×2): 1000 mL

## 2019-02-28 NOTE — Progress Notes (Signed)
Nutrition Follow-up / Consult RD working remotely.  DOCUMENTATION CODES:   Not applicable  INTERVENTION:    Vital AF 1.2 at 20 ml/h, advance to goal rate of 80 ml/h (1920 ml per day) tomorrow.   Provides 2304 kcal, 144 gm protein, 1557 ml free water daily.  NUTRITION DIAGNOSIS:   Increased nutrient needs related to acute illness(COVID) as evidenced by estimated needs.  Ongoing   GOAL:   Patient will meet greater than or equal to 90% of their needs  Progressing   MONITOR:   Vent status, Labs, Weight trends, Skin, I & O's  REASON FOR ASSESSMENT:   Ventilator, Consult Assessment of nutrition requirement/status, Enteral/tube feeding initiation and management  ASSESSMENT:   39 yo male admitted with AMS and increased WOB, DKA. Required intubation on admission. COVID positive on 1/3. PMH includes DM (on insulin at home), ETOH abuse, pancreatitis, depression, alcoholic hepatitis.  CT abdomen  1/12 showed diffuse thickening of the small bowel and marked thickening of the transverse colon, nonspecific. Lactic acid trending down, 5.4-->5-->3.8. Trickle tube feeding ordered today.  Patient is currently intubated on ventilator support, on 3 pressors. MV: 12.8 L/min Temp (24hrs), Avg:99.8 F (37.7 C), Min:98.7 F (37.1 C), Max:101.1 F (38.4 C)  Propofol: none Labs reviewed. Ionized calcium 0.81 (L), lactic acid 3.8 (H), trending down CBG's: 102-128  Medications reviewed and include decadron, folic acid, novolog, lantus, thiamine, levophed, phenylephrine, vasopressin.  No new weight available.   Diet Order:   Diet Order            Diet NPO time specified  Diet effective now              EDUCATION NEEDS:   Not appropriate for education at this time  Skin:  Skin Assessment: Skin Integrity Issues: Skin Integrity Issues:: Stage I Stage I: sacrum  Last BM:  1/13  Height:   Ht Readings from Last 1 Encounters:  02/26/19 6\' 1"  (1.854 m)    Weight:   Wt  Readings from Last 1 Encounters:  02/27/19 91.4 kg    Ideal Body Weight:  83.6 kg  BMI:  Body mass index is 26.58 kg/m.  Estimated Nutritional Needs:   Kcal:  2375  Protein:  140-165 gm  Fluid:  >/= 2.4 L    04/27/19, RD, LDN, CNSC Pager [phone] After Hours Pager [phone]

## 2019-02-28 NOTE — Progress Notes (Addendum)
NAME:  Shawn Watkins, MRN:  [id number], DOB:  [birth year], LOS: 2 ADMISSION DATE:  02/26/2019, CONSULTATION DATE:  1/11 REFERRING MD:  Dr. Roderic Palau, CHIEF COMPLAINT:  AMS, DKA, COVID   Brief History   39 y/o M with DM, ETOH who was recently seen on 1/3 with N/V, sore throat, body aches.  He was diagnosed with COVID at that time but left AMA.  He returned to [facility] 1/11 with altered mental status & increased work of breathing.  He was found to be in DKA and intubated due to altered mental status.  Tx to [facility] 1/11 for further care.   Adm labs  pH of 6.9, bicarb 4, AG 43, Sr Cr 2.9 and fever to 101.   Past Medical History  DM II ETOH Abuse - prior alcoholic hepatitis, DT's with ETOH withdrawal seizures, pancreatitis  Depression   Significant Hospital Events   1/03 Seen in [facility] ER, dx with COVID, left AMA  1/11 Admitted with DKA, AMS, intubated.  Tx to [facility] from [facility] 1/11 hypotensive on 3 pressors  Consults:  TRH  PCCM   Procedures:  ETT 1/11 >>  R Fem TLC 1/11 >>   Significant Diagnostic Tests:  UDS 1/11 (post arrival to [facility]) >> pos BDZ ECHO 1/11 >> nml LV fn 1/12 CT abdomen/pelvis without contrast >> diffuse small bowel thickening, thickening of transverse colon?  Shock bowel  Micro Data:  COVID 1/03 >> positive  BCx2 1/11 >> ng UA 1/11 >> negative, + glucose UC 1/11 >> ng  Antimicrobials:  Vanco 1/11 >> 1/13  Cefepime 1/11 >>    Interim history/subjective:   Febrile 101. Remains hypotensive on 3 pressors, Neo-Synephrine being tapered. Oliguric Critically ill, intubated and sedated on Versed/fentanyl  Objective   Blood pressure 112/84, pulse 81, temperature 99.1 F (37.3 C), temperature source Core, resp. rate (!) 25, height 6\' 1"  (1.854 m), weight 91.4 kg, SpO2 99 %. CVP:  [0 mmHg-26 mmHg] 19 mmHg  Vent Mode: PRVC FiO2 (%):  [40 %-50 %] 50 % Set Rate:  [25 bmp] 25 bmp Vt Set:  [560 mL] 560 mL PEEP:  [5 cmH20-8 cmH20] 8 cmH20 Plateau Pressure:  [16 cmH20-17 cmH20] 16  cmH20   Intake/Output Summary (Last 24 hours) at 02/28/2019 1416 Last data filed at 02/28/2019 1200 Gross per 24 hour  Intake 4055.98 ml  Output 280 ml  Net 3775.98 ml   Filed Weights   02/26/19 0030 02/27/19 0456  Weight: 81.6 kg 91.4 kg    Examination: General: thin adult male  on vent in NAD, critically ill appearing HEENT: Dry mucosa, ETT, pupils 11mm/reactive  Neuro: sedate, RASS -3,  intermittent agitation on 1/11 but no breakthrough last few hours CV: s1s2 RRR, ST on monitor, no m/r/g PULM:  no accessory muscle use, bilateral ventilated breath sounds GI: soft, bsx4 active  Extremities: warm/dry, no edema  Skin: multiple areas of acne on thighs, no breakdown / ulcerations  Chest x-ray 1/12 personally reviewed which shows clear lung fields, no new infiltrates  ABG shows respiratory alkalosis with hypoxia Labs show rising creatinine now plateauing to 5.6, normal electrolytes, hypocalcemia, decreasing LFTs, lactate clearing, stable leukocytosis, worsening thrombocytopenia  Resolved Hospital Problem list      Assessment & Plan:   DKA  Type 1 diabetes  - titrated off insulin drip after giving Levemir now hypoglycemic , home Lantus dose is 15 units, and decreasing insulin requirements likely due to AKI -Decrease Lantus dose 5 units daily and changed to  sensitive scale -hold home glimepiride   Shock , persistent ?  Shock bowel -no clear evidence of ischemia Lactate cleared Initially felt to be volume depletion with DKA / hx ETOH use, possible sepsis with COVID, elevated PCT.  Troponin flat, echo normal -ct levophed, taper off neosynephrine for MAP >65 -ct vasopressin -DC vancomycin but continue cefepime until off pressors   Acute Respiratory Failure  Tobacco Abuse  In setting of metabolic encephalopathy, DKA -Proceed with spontaneous breathing trials and plan for extubation once shock resolves  COVID-19 Infection  Positive on 1/3.  CXR clear on admit.  -follow  LFT's, may be able to re-consider for remdesivir if LFTs improve -continue decadron -watch sugars  Elevated LFT's -acute alcoholic hepatitis -Monitor -start TFs @20 /h   AKI  Hypocalcemia Acute Metabolic Acidosis -resolved In setting of volume depletion, hypotension -Trend BMP / urinary output -creatinine plateauing, expect urine output to improve as pressor requirements decrease and BP improves -Replace electrolytes as indicated, 1 gm calcium  -Avoid nephrotoxins  ETOH Abuse  Positive on admit.  At risk for withdrawal - versed gtt + fent gtt , goal RASS -1 -monitor for withdrawal symptoms  -ETOH abuse counseling when able , dad reports longstanding history of alcoholism and recently got out of rehab in   Summary -shock is persistent but just starting to improve lactate has cleared which is a good sign, no clear evidence of ischemic bowel, AKI evolving but hopefully will improve without requiring dialysis -   Best practice:  Diet: NPO  Pain/Anxiety/Delirium protocol (if indicated):  Fentanyl,  versed gtt VAP protocol (if indicated): In place  DVT prophylaxis: Heparin SQ  GI prophylaxis: PPI  Glucose control: SSI with Lantus Mobility: BR  Code Status: Full Code  Family Communication: father [place] 1/12 updated and should be contact, mom 3/12 is in [place] & is not involved Disposition: ICU     The patient is critically ill with multiple organ systems failure and requires high complexity decision making for assessment and support, frequent evaluation and titration of therapies, application of advanced monitoring technologies and extensive interpretation of multiple databases. Critical Care Time devoted to patient care services described in this note independent of APP/resident  time is 35 minutes.    Gilmer MD. Cyril Mourning. [place] [facility]  If no response to pager , please call 319 [phone]   02/28/2019

## 2019-02-28 NOTE — Progress Notes (Signed)
eLink Physician-Brief Progress Note Patient Name: HOLTEN SPANO DOB: [birth year] MRN: [id number]   Date of Service  02/28/2019  HPI/Events of Note  Calcium 6.0, Lactic acid 5.0, Albumin 2.1  eICU Interventions  Calcium gluconate I gm iv, Albumin 25 % 25 gm iv        Asencion Loveday U Nylan Nevel 02/28/2019, 1:54 AM

## 2019-02-28 NOTE — Plan of Care (Signed)
  Problem: Education: Goal: Knowledge of risk factors and measures for prevention of condition will improve Outcome: Progressing   Problem: Coping: Goal: Psychosocial and spiritual needs will be supported Outcome: Progressing   Problem: Respiratory: Goal: Will maintain a patent airway Outcome: Progressing Goal: Complications related to the disease process, condition or treatment will be avoided or minimized Outcome: Progressing   Problem: Education: Goal: Ability to describe self-care measures that may prevent or decrease complications (Diabetes Survival Skills Education) will improve Outcome: Progressing Goal: Individualized Educational Video(s) Outcome: Progressing   Problem: Coping: Goal: Ability to adjust to condition or change in health will improve Outcome: Progressing   Problem: Fluid Volume: Goal: Ability to maintain a balanced intake and output will improve Outcome: Progressing   Problem: Health Behavior/Discharge Planning: Goal: Ability to identify and utilize available resources and services will improve Outcome: Progressing Goal: Ability to manage health-related needs will improve Outcome: Progressing   Problem: Metabolic: Goal: Ability to maintain appropriate glucose levels will improve Outcome: Progressing   Problem: Nutritional: Goal: Maintenance of adequate nutrition will improve Outcome: Progressing Goal: Progress toward achieving an optimal weight will improve Outcome: Progressing   Problem: Skin Integrity: Goal: Risk for impaired skin integrity will decrease Outcome: Progressing   Problem: Tissue Perfusion: Goal: Adequacy of tissue perfusion will improve Outcome: Progressing   Problem: Activity: Goal: Ability to tolerate increased activity will improve Outcome: Progressing   Problem: Respiratory: Goal: Ability to maintain a clear airway and adequate ventilation will improve Outcome: Progressing   Problem: Role Relationship: Goal: Method  of communication will improve Outcome: Progressing   

## 2019-02-28 NOTE — Progress Notes (Signed)
Inpatient Diabetes Program Recommendations  AACE/ADA: New Consensus Statement on Inpatient Glycemic Control (2015)  Target Ranges:  Prepandial:   less than 140 mg/dL      Peak postprandial:   less than 180 mg/dL (1-2 hours)      Critically ill patients:  140 - 180 mg/dL   Lab Results  Component Value Date   GLUCAP 119 (H) 02/28/2019   HGBA1C 12.2 (H) 02/27/2019    Review of Glycemic Control Results for Shawn Watkins, Shawn Watkins (MRN [id number]) as of 02/28/2019 07:14  Ref. Range 02/27/2019 14:31 02/27/2019 19:51 02/28/2019 00:16 02/28/2019 04:34  Glucose-Capillary Latest Ref Range: 70 - 99 mg/dL 712 (H) 84 62 (L) 458 (H)  Diabetes history: DM 2 Outpatient Diabetes medications:  Lantus 15 units q HS, Admelog 5 units tid with meals Current orders for Inpatient glycemic control:  Lantus 15 units daily, Novolog resistant q 4 hours  Inpatient Diabetes Program Recommendations:   Please consider reducing Lantus to 10 units daily and reduce Novolog correction to sensitive.    Thanks  Beryl Meager, RN, BC-ADM Inpatient Diabetes Coordinator Pager [phone] (8a-5p)

## 2019-02-28 NOTE — Progress Notes (Signed)
Pt has a wound on his sacrum it is charted as a stage one but it looks more like a deep tissue injury. I put a sacral meplex on there. Will take picture and find out if the original charting can be modified

## 2019-02-28 NOTE — Progress Notes (Signed)
Pt's father has been called and updated on pt's current condition. All questions answered.

## 2019-02-28 NOTE — Procedures (Signed)
Femoral Arterial Line Procedure Note  INDICATION: Hypotension requiring vasopressors   CONSENT:  Consent was on file.   PROCEDURE SUMMARY:  A time out was performed. My hands were washed immediately prior to the procedure. I wore a surgical cap, mask with protective eyewear, sterile gown and sterile gloves throughout the procedure. The Left inguinal region was prepped using chlorhexidine scrub and draped in sterile fashion using a three quarter sheet drape and sterile towels. The femoral pulse was identified. Palpating the femoral pulse throughout the procedure, the introducer needle was inserted into the femoral artery. Arterial blood was withdrawn. The syringe was removed and a guidewire was advanced through the needle into the femoral artery. The needle was exchanged over the wire for an arterial catheter. The wire was removed and the catheter was secured to the skin using a suture. The patient tolerated the procedure without any hemodynamic compromise. At time of procedure completion, the catheter was connected to the cardiac monitor and calibrated. Appropriate waveform and blood pressure tracing was observed. Estimated blood loss is 2ml.  Coletta Memos  3:36 AM 02/28/19

## 2019-03-01 ENCOUNTER — Inpatient Hospital Stay (HOSPITAL_COMMUNITY): Payer: HRSA Program

## 2019-03-01 DIAGNOSIS — U071 COVID-19: Principal | ICD-10-CM

## 2019-03-01 DIAGNOSIS — F1010 Alcohol abuse, uncomplicated: Secondary | ICD-10-CM

## 2019-03-01 LAB — COMPREHENSIVE METABOLIC PANEL
ALT: 153 U/L — ABNORMAL HIGH (ref 0–44)
AST: 343 U/L — ABNORMAL HIGH (ref 15–41)
Albumin: 2.1 g/dL — ABNORMAL LOW (ref 3.5–5.0)
Alkaline Phosphatase: 189 U/L — ABNORMAL HIGH (ref 38–126)
Anion gap: 14 (ref 5–15)
BUN: 76 mg/dL — ABNORMAL HIGH (ref 6–20)
CO2: 24 mmol/L (ref 22–32)
Calcium: 5.9 mg/dL — CL (ref 8.9–10.3)
Chloride: 102 mmol/L (ref 98–111)
Creatinine, Ser: 6.36 mg/dL — ABNORMAL HIGH (ref 0.61–1.24)
GFR calc Af Amer: 12 mL/min — ABNORMAL LOW (ref >60)
GFR calc non Af Amer: 10 mL/min — ABNORMAL LOW (ref >60)
Glucose, Bld: 170 mg/dL — ABNORMAL HIGH (ref 70–99)
Potassium: 4.2 mmol/L (ref 3.5–5.1)
Sodium: 140 mmol/L (ref 135–145)
Total Bilirubin: 2 mg/dL — ABNORMAL HIGH (ref 0.3–1.2)
Total Protein: 4.5 g/dL — ABNORMAL LOW (ref 6.5–8.1)

## 2019-03-01 LAB — URINALYSIS, ROUTINE W REFLEX MICROSCOPIC
Bilirubin Urine: NEGATIVE
Glucose, UA: NEGATIVE mg/dL
Ketones, ur: 5 mg/dL — AB
Nitrite: NEGATIVE
Protein, ur: 100 mg/dL — AB
Specific Gravity, Urine: 1.018 (ref 1.005–1.030)
pH: 5 (ref 5.0–8.0)

## 2019-03-01 LAB — CBC WITH DIFFERENTIAL/PLATELET
Abs Immature Granulocytes: 0.07 10*3/uL (ref 0.00–0.07)
Basophils Absolute: 0 10*3/uL (ref 0.0–0.1)
Basophils Relative: 0 %
Eosinophils Absolute: 0 10*3/uL (ref 0.0–0.5)
Eosinophils Relative: 0 %
HCT: 27.2 % — ABNORMAL LOW (ref 39.0–52.0)
Hemoglobin: 9.9 g/dL — ABNORMAL LOW (ref 13.0–17.0)
Immature Granulocytes: 0 %
Lymphocytes Relative: 6 %
Lymphs Abs: 0.9 10*3/uL (ref 0.7–4.0)
MCH: 30 pg (ref 26.0–34.0)
MCHC: 36.4 g/dL — ABNORMAL HIGH (ref 30.0–36.0)
MCV: 82.4 fL (ref 80.0–100.0)
Monocytes Absolute: 0.3 10*3/uL (ref 0.1–1.0)
Monocytes Relative: 2 %
Neutro Abs: 14.8 10*3/uL — ABNORMAL HIGH (ref 1.7–7.7)
Neutrophils Relative %: 92 %
Platelets: 18 10*3/uL — CL (ref 150–400)
RBC: 3.3 MIL/uL — ABNORMAL LOW (ref 4.22–5.81)
RDW: 13.6 % (ref 11.5–15.5)
WBC: 16.1 10*3/uL — ABNORMAL HIGH (ref 4.0–10.5)
nRBC: 0 % (ref 0.0–0.2)

## 2019-03-01 LAB — CREATININE, URINE, RANDOM: Creatinine, Urine: 98.45 mg/dL

## 2019-03-01 LAB — POCT I-STAT 7, (LYTES, BLD GAS, ICA,H+H)
Bicarbonate: 22.5 mmol/L (ref 20.0–28.0)
Calcium, Ion: 0.84 mmol/L — CL (ref 1.15–1.40)
HCT: 25 % — ABNORMAL LOW (ref 39.0–52.0)
Hemoglobin: 8.5 g/dL — ABNORMAL LOW (ref 13.0–17.0)
O2 Saturation: 98 %
Patient temperature: 36
Potassium: 4.2 mmol/L (ref 3.5–5.1)
Sodium: 140 mmol/L (ref 135–145)
TCO2: 23 mmol/L (ref 22–32)
pCO2 arterial: 26.1 mmHg — ABNORMAL LOW (ref 32.0–48.0)
pH, Arterial: 7.541 — ABNORMAL HIGH (ref 7.350–7.450)
pO2, Arterial: 80 mmHg — ABNORMAL LOW (ref 83.0–108.0)

## 2019-03-01 LAB — GLUCOSE, CAPILLARY
Glucose-Capillary: 153 mg/dL — ABNORMAL HIGH (ref 70–99)
Glucose-Capillary: 162 mg/dL — ABNORMAL HIGH (ref 70–99)
Glucose-Capillary: 165 mg/dL — ABNORMAL HIGH (ref 70–99)
Glucose-Capillary: 173 mg/dL — ABNORMAL HIGH (ref 70–99)
Glucose-Capillary: 180 mg/dL — ABNORMAL HIGH (ref 70–99)
Glucose-Capillary: 181 mg/dL — ABNORMAL HIGH (ref 70–99)
Glucose-Capillary: 182 mg/dL — ABNORMAL HIGH (ref 70–99)

## 2019-03-01 LAB — DIC (DISSEMINATED INTRAVASCULAR COAGULATION)PANEL
D-Dimer, Quant: 1.91 ug/mL-FEU — ABNORMAL HIGH (ref 0.00–0.50)
Fibrinogen: 435 mg/dL (ref 210–475)
INR: 1.5 — ABNORMAL HIGH (ref 0.8–1.2)
Platelets: 17 10*3/uL — CL (ref 150–400)
Prothrombin Time: 18.1 seconds — ABNORMAL HIGH (ref 11.4–15.2)
Smear Review: NONE SEEN
aPTT: 29 seconds (ref 24–36)

## 2019-03-01 LAB — CK: Total CK: 1720 U/L — ABNORMAL HIGH (ref 49–397)

## 2019-03-01 LAB — C-REACTIVE PROTEIN: CRP: 20.3 mg/dL — ABNORMAL HIGH (ref <1.0)

## 2019-03-01 LAB — FERRITIN: Ferritin: 1119 ng/mL — ABNORMAL HIGH (ref 24–336)

## 2019-03-01 LAB — D-DIMER, QUANTITATIVE: D-Dimer, Quant: 2.27 ug/mL-FEU — ABNORMAL HIGH (ref 0.00–0.50)

## 2019-03-01 LAB — PLATELET COUNT: Platelets: 17 10*3/uL — CL (ref 150–400)

## 2019-03-01 LAB — SODIUM, URINE, RANDOM: Sodium, Ur: 28 mmol/L

## 2019-03-01 LAB — LACTIC ACID, PLASMA: Lactic Acid, Venous: 2.5 mmol/L (ref 0.5–1.9)

## 2019-03-01 LAB — SAVE SMEAR(SSMR), FOR PROVIDER SLIDE REVIEW

## 2019-03-01 MED ORDER — CALCIUM GLUCONATE-NACL 2-0.675 GM/100ML-% IV SOLN
2.0000 g | Freq: Once | INTRAVENOUS | Status: AC
  Administered 2019-03-01: 2000 mg via INTRAVENOUS
  Filled 2019-03-01: qty 100

## 2019-03-01 MED ORDER — FENTANYL CITRATE (PF) 100 MCG/2ML IJ SOLN
12.5000 ug | INTRAMUSCULAR | Status: DC | PRN
  Administered 2019-03-01 – 2019-03-02 (×2): 25 ug via INTRAVENOUS
  Filled 2019-03-01 (×3): qty 2

## 2019-03-01 MED ORDER — LACTATED RINGERS IV BOLUS
1000.0000 mL | Freq: Once | INTRAVENOUS | Status: AC
  Administered 2019-03-01: 1000 mL via INTRAVENOUS

## 2019-03-01 MED ORDER — DEXMEDETOMIDINE HCL IN NACL 400 MCG/100ML IV SOLN
0.4000 ug/kg/h | INTRAVENOUS | Status: DC
  Administered 2019-03-01 (×3): 0.4 ug/kg/h via INTRAVENOUS
  Administered 2019-03-02: 1.1 ug/kg/h via INTRAVENOUS
  Filled 2019-03-01 (×5): qty 100

## 2019-03-01 MED ORDER — VITAL AF 1.2 CAL PO LIQD
1000.0000 mL | ORAL | Status: DC
  Administered 2019-03-02 – 2019-03-04 (×2): 1000 mL

## 2019-03-01 MED ORDER — MIDAZOLAM HCL 2 MG/2ML IJ SOLN
2.0000 mg | INTRAMUSCULAR | Status: DC | PRN
  Administered 2019-03-01 – 2019-03-02 (×9): 2 mg via INTRAVENOUS
  Administered 2019-03-03: 1 mg via INTRAVENOUS
  Administered 2019-03-03 (×4): 2 mg via INTRAVENOUS
  Administered 2019-03-03: 1 mg via INTRAVENOUS
  Administered 2019-03-03: 2 mg via INTRAVENOUS
  Filled 2019-03-01 (×8): qty 2

## 2019-03-01 NOTE — Progress Notes (Signed)
Called eLINK to report critical lab value for pt. Platelets 18. See orders

## 2019-03-01 NOTE — Progress Notes (Signed)
eLink Physician-Brief Progress Note Patient Name: THESEUS BIRNIE DOB: 1980/10/15 MRN: [id number]   Date of Service  03/01/2019  HPI/Events of Note  Hypocalcemia   eICU Interventions  2 gram IV calcium gluconate x 1 to be given Repeat ionized calcium this AM ordered     Intervention Category Intermediate Interventions: Diagnostic test evaluation  Oretha Milch 03/01/2019, 5:50 AM

## 2019-03-01 NOTE — Progress Notes (Addendum)
[facility] PCCM  Discussed situation with his father at length.  He believes that he may have been in bed for several days and relapsed drinking.   Suspect rhabdo Check CK, urinalysis  Heber [place], MD [place] [facility] Pager: [phone] Cell: [phone] If no response, call [phone]

## 2019-03-01 NOTE — Progress Notes (Signed)
Patient coughing and grimacing. Contacted ELink regarding no PRN pain medications.

## 2019-03-01 NOTE — Progress Notes (Signed)
Mother called. Gave update and answered questions. Reiterated that the MD will call and update the patient's father. Appreciated the update

## 2019-03-01 NOTE — Progress Notes (Signed)
Called eLINK to report critical lab value for pt. Ca+ 5.6. See orders

## 2019-03-01 NOTE — Consult Note (Signed)
WOC Nurse Consult Note: Patient receiving care in CGV 9204; patient COVID +. Reason for Consult: sacral wound Wound type: evolving DTPI to sacrum and coccyx Pressure Injury POA: Yes Measurement: To be provided by the bedside RN in the flowsheet section Wound bed: DTPI with peeling skin Drainage (amount, consistency, odor) none Periwound: intact Dressing procedure/placement/frequency: Patient on an low air loss mattress.  This wound will worsen and evolve into an unstageable and will need therapy changed at that time.  For now, place a Xeroform gauze [place] [phone]) over the area, cover with a foam dressing; change daily. Consider placement of a fecal containment system (Flexiseal). Monitor the wound area(s) for worsening of condition such as: Signs/symptoms of infection,  Increase in size,  Development of or worsening of odor, Development of pain, or increased pain at the affected locations.  Notify the medical team if any of these develop.  Thank you for the consult.  Discussed plan of care with the bedside nurse.  WOC nurse will not follow at this time.  Please re-consult the WOC team if needed.  Helmut Muster, RN, MSN, CWOCN, CNS-BC, pager [phone]

## 2019-03-01 NOTE — Progress Notes (Signed)
[facility] PCCM  Labs more consistent with DIC/sepsis than TTP Continue current management  Heber [place], MD [place] [facility] Pager: [phone] Cell: [phone] If no response, call [phone]

## 2019-03-01 NOTE — Progress Notes (Signed)
Spoke with patient's father. Update given and all questions/concerns addressed. Patient's father appreciative of update.

## 2019-03-01 NOTE — Progress Notes (Signed)
eLink Physician-Brief Progress Note Patient Name: Shawn Watkins DOB: 1980/05/19 MRN: [id number]   Date of Service  03/01/2019  HPI/Events of Note  Pt intermittently grimacing as if in pain, abnormal LFT's preclude Tylenol  for pain  eICU Interventions  Fentanyl 12.5-25 mcg iv Q 4 hours PRN pain        Migdalia Dk 03/01/2019, 10:39 PM

## 2019-03-01 NOTE — Progress Notes (Signed)
NAME:  Shawn Watkins, MRN:  [id number], DOB:  06-May-1980, LOS: 3 ADMISSION DATE:  02/26/2019, CONSULTATION DATE:  1/11 REFERRING MD:  Kerry Hough, CHIEF COMPLAINT:  Dyspnea, DKA, COVID   Brief History   39 y/o male seen 1/3 with nausea, vomiting, sore throat and body aches.  Diagnosed with COVID, then left for home AMA.  Returned 1/11 to [facility], in DKA, intubated for delirium and transferred to [facility].  Past Medical History  DM I ETOH Abuse - prior alcoholic hepatitis, DT's with ETOH withdrawal seizures, pancreatitis  Depression   Significant Hospital Events   1/03 Seen in [facility] ER, dx with COVID, left AMA  1/11 Admitted with DKA, AMS, intubated.  Tx to [facility] from [facility] 1/11 hypotensive on 3 pressors  Consults:  TRH  PCCM   Procedures:  ETT 1/11 >>  R Fem TLC 1/11 >>  Significant Diagnostic Tests:  UDS 1/11 (post arrival to [facility]) >> pos BDZ ECHO 1/11 >> nml LV fn 1/12 CT abdomen/pelvis without contrast >> diffuse small bowel thickening, thickening of transverse colon?  Shock bowel, hepatic steatosis  Micro Data:  COVID 1/03 >> positive  BCx2 1/11 >> ng UA 1/11 >> negative, + glucose UC 1/11 >> ng  Antimicrobials:  Vanco 1/11 >> 1/13  Cefepime 1/11 >>    Interim history/subjective:  Failed wean this morning because of mental status, not awake enough, not breathing Has periods of severe agitation Sedation was turned down yesterday Remains on levophed, when he receives versed his pressure will drop Will look around, doesn't follow commands precedex started around 9:00 Fentanyl/versed stopped yesterday Had agitation overnight 2mg  versed overnight, 4AM Oliguric Renal function worsening Thrombocytopenic Lots of secretions  Objective   Blood pressure 99/80, pulse 78, temperature (!) 96.8 F (36 C), temperature source Core, resp. rate (!) 25, height 6\' 1"  (1.854 m), weight 98.8 kg, SpO2 97 %. CVP:  [8 mmHg-26 mmHg] 8 mmHg  Vent Mode: PRVC FiO2 (%):  [40 %-50 %] 40 % Set Rate:   [25 bmp] 25 bmp Vt Set:  [560 mL] 560 mL PEEP:  [8 cmH20] 8 cmH20 Plateau Pressure:  [17 cmH20-21 cmH20] 21 cmH20   Intake/Output Summary (Last 24 hours) at 03/01/2019 0835 Last data filed at 03/01/2019 0758 Gross per 24 hour  Intake 4040.65 ml  Output 168 ml  Net 3872.65 ml   Filed Weights   02/26/19 0030 02/27/19 0456 03/01/19 0500  Weight: 81.6 kg 91.4 kg 98.8 kg    Examination:  General:  In bed on vent HENT: NCAT ETT in place PULM: CTA B, vent supported breathing CV: RRR, no mgr GI: BS+, soft, nontender MSK: normal bulk and tone Derm: large black pressure sore on both buttocks Neuro: sedated on vent   Resolved Hospital Problem list     Assessment & Plan:  DKA Type 1 DM Continue SSI and glargine Monitor glucose Continue tube feeding> Vital 1.2  Shock, lactic acidosis> improving shock, lactic acidosis Bowel wall edema Wean off leveophed for MAP > 65 KVO fluids Advance diet  Acute respriatory failure due to delirium Oxygenation and ventilatory mechanics favor extubation Wean off sedation Frequent orientation Pressure support as long as tolerated VAP prevention Extubate if more awake CXR today  Acute encephalopathy: toxic metabolic encephalopathy Alcohol abuse> father said he relapsed 01/2019 Minimize sedation PAD protocol  Stop decadron as not hypoxemic Change RASS target to 0 to -1 Check ammonia  COVID 19 Decadron 10 days  Large pressure wound on buttocks Wound care consultation  today Foam dressing  Alcoholic hepatitis, acute Hepatic steatosis> alcohol related most likely Improving Monitor LFT periodically  AKI> worsening, oliguric, but no clear indication for HD Monitor BMET and UOP Replace electrolytes as needed Consult renal    Thrombocytopenia with associated acidosis, delirium, renal failure: ddx broad includes TTP, DIC, marrow suppression, clumping platelets, drug effect Check smear for schistocytes DIC panel Check platelet  count in citrated tube Consider work up for TTP if schistocytes   Best practice:  Diet: resume tube feeding Pain/Anxiety/Delirium protocol (if indicated): change to O to -1 VAP protocol (if indicated): yes DVT prophylaxis: SCD GI prophylaxis: continue pantoprazole Glucose control: SSI Mobility: bed rest Code Status: Full Family Communication:  Disposition: remain in ICU  Labs   CBC: Recent Labs  Lab 02/26/19 0151 02/26/19 1022 02/27/19 0512 02/27/19 0512 02/27/19 2348 02/28/19 0537 02/28/19 0546 03/01/19 0401 03/01/19 0533  WBC 10.3  --  31.2*  --  30.5*  --  28.6* 16.1*  --   NEUTROABS 5.3  --  26.6*  --   --   --  27.2* 14.8*  --   HGB 14.3   < > 12.6*   < > 11.3* 9.9* 11.0* 9.9* 8.5*  HCT 47.5   < > 36.1*   < > 31.4* 29.0* 30.5* 27.2* 25.0*  MCV 97.3  --  83.8  --  82.2  --  82.2 82.4  --   PLT 174  --  71*  --  40*  --  32* 18*  --    < > = values in this interval not displayed.    Basic Metabolic Panel: Recent Labs  Lab 02/26/19 0152 02/26/19 0539 02/27/19 0512 02/27/19 0512 02/27/19 1500 02/27/19 1500 02/27/19 2348 02/28/19 0537 02/28/19 0546 03/01/19 0401 03/01/19 0533  NA  --    < > 143   < > 141   < > 142 143 142 140 140  K  --    < > 3.7   < > 3.8   < > 3.9 3.8 4.2 4.2 4.2  CL  --    < > 96*  --  98  --  100  --  99 102  --   CO2  --    < > 27  --  27  --  27  --  27 24  --   GLUCOSE  --    < > 98  --  121*  --  80  --  118* 170*  --   BUN  --    < > 43*  --  44*  --  54*  --  57* 76*  --   CREATININE  --    < > 4.20*  --  4.63*  --  5.55*  --  5.63* 6.36*  --   CALCIUM  --    < > 6.3*  --  6.0*  --  6.0*  --  6.2* 5.9*  --   MG 2.9*  --   --   --   --   --   --   --   --   --   --    < > = values in this interval not displayed.   GFR: Estimated Creatinine Clearance: 19.7 mL/min (A) (by C-G formula based on SCr of 6.36 mg/dL (H)). Recent Labs  Lab 02/26/19 0151 02/26/19 0151 02/26/19 0539 02/26/19 1815 02/27/19 0512 02/27/19 1500  02/27/19 2348 02/27/19 2349 02/28/19 0546 02/28/19 0547 03/01/19 0401  PROCALCITON 0.71  --   --   --   --   --   --   --   --   --   --   WBC 10.3   < >  --   --  31.2*  --  30.5*  --  28.6*  --  16.1*  LATICACIDVEN  --   --    < > >11.0*  --  5.4*  --  5.0*  --  3.8*  --    < > = values in this interval not displayed.    Liver Function Tests: Recent Labs  Lab 02/26/19 0151 02/27/19 0512 02/28/19 0546 03/01/19 0401  AST 504* 883* 559* 343*  ALT 150* 240* 210* 153*  ALKPHOS 197* 144* 156* 189*  BILITOT 1.4* 1.6* 1.4* 2.0*  PROT 6.3* 4.1* 4.5* 4.5*  ALBUMIN 3.2* 2.1* 2.3* 2.1*   Recent Labs  Lab 02/26/19 0409 02/27/19 1530  LIPASE 55* 18   No results for input(s): AMMONIA in the last 168 hours.  ABG    Component Value Date/Time   PHART 7.541 (H) 03/01/2019 0533   PCO2ART 26.1 (L) 03/01/2019 0533   PO2ART 80.0 (L) 03/01/2019 0533   HCO3 22.5 03/01/2019 0533   TCO2 23 03/01/2019 0533   ACIDBASEDEF 15.0 (H) 02/26/2019 1022   O2SAT 98.0 03/01/2019 0533     Coagulation Profile: No results for input(s): INR, PROTIME in the last 168 hours.  Cardiac Enzymes: No results for input(s): CKTOTAL, CKMB, CKMBINDEX, TROPONINI in the last 168 hours.  HbA1C: Hgb A1c MFr Bld  Date/Time Value Ref Range Status  02/27/2019 03:30 PM 12.2 (H) 4.8 - 5.6 % Final    Comment:    (NOTE) Pre diabetes:          5.7%-6.4% Diabetes:              >6.4% Glycemic control for   <7.0% adults with diabetes   02/06/2017 01:10 PM 9.8 (H) 4.8 - 5.6 % Final    Comment:    (NOTE) Pre diabetes:          5.7%-6.4% Diabetes:              >6.4% Glycemic control for   <7.0% adults with diabetes     CBG: Recent Labs  Lab 02/28/19 1517 02/28/19 1954 02/28/19 2354 03/01/19 0354 03/01/19 0743  GLUCAP 137* 139* 153* 182* 180*     Critical care time: 36 minutes     Roselie Awkward, MD [place] [facility] Pager: [phone] Cell: [phone] If no response, call [phone]

## 2019-03-01 NOTE — Progress Notes (Signed)
AgitateLink Physician-Brief Progress Note Patient Name: ARIAS WEINERT DOB: [birth year] MRN: [id number]   Date of Service  03/01/2019  HPI/Events of Note  Agitation, moving all extremities and reaching for ETT. Has versed ordered q2h PRN, very uncomfortable   eICU Interventions  Changing versed to q1h if needed Ordered precedex and asked RN to start this in case more doses of versed are given, so we can cut down on benzos      Intervention Category Major Interventions: Delirium, psychosis, severe agitation - evaluation and management  Oretha Milch 03/01/2019, 2:39 AM

## 2019-03-01 NOTE — Consult Note (Signed)
Renal Service Consult Note [facility]  SUNNY GAINS 03/01/2019 Maree Krabbe Requesting Physician:  Dr Kendrick Fries  Reason for Consult:  AKI  HPI: The patient is a 39 y.o. year-old w/ hx of DM2, etoh/ tobacco abuse, depression , DT"s and alcoholic hepatitis presented 1/3 for N/V and body aches and was diagnosed w/ COVID but left AMA. Returned to [facility] 02/26/19 w/ AMS , and resp failure.  Found to be in DKA, was intubated due to AMS and admitted to [facility].  Pt's creat was 2.9 on admission and up to 6.3 today, BUN 76.  Creat was 0.6 on 02/18/19. Asked to see for AKI.    Pt seen in ICU, he is sedated and on vent, no ROS available.    I/O since admit ware 19L in and 2L out = + 17L  wt's since admit 81kg >> 99kg = +18kg  CXR 1/11 and 1/12 appear normal   CT abd / pelvis showed bilat pleural effusions    Na 142 K 4.2  CO2 24  UBN 76  Cr 6.36  Ca 5.9  Alb 2.1  AST 343/ ALT 153, Tbili 2.0, total prot 4.5   ABG  7.54/ 26 / 80   WBC 16k  Hb 9.9  plt 174 on admit >> down to 18k today   IV abx:      > cefepime 1/10- current   > IV vanc 1.75 gm on 1/10 only   > IV flagyl x 1 on 1/10    WBC 10 >> 32k >> 16k today    Fevers high but coming down last 24 hrs    IV pressors + vaso, + levo currently   UOP 100- 200 cc /d recently   ROS  n/a   Past Medical History  Past Medical History:  Diagnosis Date  . Alcoholic hepatitis ~ 2011, 07/2016, 01/2017   course of prednisolone in 07/2016  . Delirium tremens (HCC) 07/2016   also hx ETOH withdrawal seizures prior to 2018  . Depression   . ETOH abuse 07/17/2016  . Pancreatitis 2006, 2017, 07/2016   due to ETOH  . Tobacco abuse 07/17/2016  . Transaminitis 07/17/2016  . Type 2 diabetes mellitus (HCC) 02/06/2017   Past Surgical History History reviewed. No pertinent surgical history. Family History No family history on file. Social History  reports that he has been smoking cigarettes. He started smoking about 12 years ago. He has been smoking  about 1.00 pack per day. He has never used smokeless tobacco. He reports current alcohol use. He reports that he does not use drugs. Allergies No Known Allergies Home medications Prior to Admission medications   Medication Sig Start Date End Date Taking? Authorizing Provider  insulin glargine (LANTUS) 100 UNIT/ML injection Inject 15 Units into the skin at bedtime.    [provider]  insulin lispro (ADMELOG) 100 UNIT/ML injection Inject 5 Units into the skin 3 (three) times daily with meals.    [provider]  ondansetron (ZOFRAN ODT) 4 MG disintegrating tablet Take 1 tablet (4 mg total) by mouth every 8 (eight) hours as needed for nausea or vomiting. 02/18/19   Robinson, [place] N, PA-C  pantoprazole (PROTONIX) 20 MG tablet Take 20 mg by mouth daily.    [provider]   Liver Function Tests Recent Labs  Lab 02/27/19 0512 02/28/19 0546 03/01/19 0401  AST 883* 559* 343*  ALT 240* 210* 153*  ALKPHOS 144* 156* 189*  BILITOT 1.6* 1.4* 2.0*  PROT 4.1* 4.5* 4.5*  ALBUMIN 2.1* 2.3* 2.1*   Recent Labs  Lab 02/26/19 0409 02/27/19 1530  LIPASE 55* 18   CBC Recent Labs  Lab 02/27/19 0512 02/27/19 0512 02/27/19 2348 02/28/19 0537 02/28/19 0546 03/01/19 0401 03/01/19 0533  WBC 31.2*   < > 30.5*  --  28.6* 16.1*  --   NEUTROABS 26.6*  --   --   --  27.2* 14.8*  --   HGB 12.6*   < > 11.3*   < > 11.0* 9.9* 8.5*  HCT 36.1*   < > 31.4*   < > 30.5* 27.2* 25.0*  MCV 83.8   < > 82.2  --  82.2 82.4  --   PLT 71*   < > 40*  --  32* 18*  --    < > = values in this interval not displayed.   Basic Metabolic Panel Recent Labs  Lab 02/26/19 2055 02/26/19 2055 02/27/19 0000 02/27/19 0002 02/27/19 0512 02/27/19 1500 02/27/19 2348 02/28/19 0537 02/28/19 0546 03/01/19 0401 03/01/19 0533  NA 143   < > 143   < > 143 141 142 143 142 140 140  K 3.4*   < > 3.4*   < > 3.7 3.8 3.9 3.8 4.2 4.2 4.2  CL 97*  --  97*  --  96* 98 100  --  99 102  --   CO2 25  --  28  --   27 27 27   --  27 24  --   GLUCOSE 141*  --  119*  --  98 121* 80  --  118* 170*  --   BUN 39*  --  41*  --  43* 44* 54*  --  57* 76*  --   CREATININE 3.72*  --  3.95*  --  4.20* 4.63* 5.55*  --  5.63* 6.36*  --   CALCIUM 6.6*  --  6.5*  --  6.3* 6.0* 6.0*  --  6.2* 5.9*  --    < > = values in this interval not displayed.   Iron/TIBC/Ferritin/ %Sat    Component Value Date/Time   FERRITIN 1,119 (H) 03/01/2019 0401    Vitals:   03/01/19 0900 03/01/19 1000 03/01/19 1100 03/01/19 1200  BP:  97/78 (!) 89/71 103/87  Pulse:  70 69 62  Resp: (!) 23 (!) 22 (!) 21 19  Temp:      TempSrc:      SpO2:  99% 97% 97%  Weight:      Height:        Exam Gen on vent, sedated, not responding No rash, cyanosis or gangrene Sclera anicteric, throat w ETT  No jvd or bruits Chest clear bilat, occ rhonchi RRR no MRG Abd soft ntnd no mass or ascites +bs GU normal male w foleydraining amber urine MS no joint effusions or deformity Ext diffuse 2+ edema all ext, no wounds or ulcers Neuro is sedated on vent    Home meds:  - insulin glargine 15u hs/ insulin lispro 5 tid sq   I/O since admit ware 19L in and 2L out = + 17L  wt's since admit 81kg >> 99kg = +18kg  CXR 1/11 and 1/12 appear normal   CT abd / pelvis showed bilat pleural effusions, no ards or infiltrates    Na 142 K 4.2  CO2 24  UBN 76  Cr 6.36  Ca 5.9  Alb 2.1  AST 343/ ALT 153, Tbili 2.0, total prot  4.5   ABG  7.54/ 26 / 80   WBC 16k  Hb 9.9  plt 174 on admit >> down to 18k today   IV abx:      > cefepime 1/10- current   > IV vanc 1.75 gm on 1/10 only   > IV flagyl x 1 on 1/10    WBC 10 >> 32k >> 16k today    Fevers high but coming down last 24 hrs    UA 1/11 > 100 prot, 0-5 rbc/ wbc, rare bact, Hb small    Creat was 0.6 on 1/3, creat was 2.9 on admission 1/11    Abd CT 1/12 showed normal appearing kidneys w/o hydro    Was on 2- 3 pressors since admission, neo and vaso weaned off yest on 1/13, now on levo gtt only  Assessment/  Plan: 1. AKI - in setting of DKA and also COVID infection. CPK is pending as pt may have been down for a time.  Shock is likely cause of ATN. UOP down. Check urine lytes.  No strong indication for RRT at this time but is getting close.  Will follow.  2. COVID + infection 3. Shock - due to sepsis 4. DKA - per CCM 5. ETOH abuse 6. Vol - is up sig amounts of fluid, last CXR w/o edema however    Vinson Moselle  MD 03/01/2019, 12:45 PM

## 2019-03-02 ENCOUNTER — Other Ambulatory Visit: Payer: Self-pay

## 2019-03-02 ENCOUNTER — Inpatient Hospital Stay (HOSPITAL_COMMUNITY): Payer: HRSA Program

## 2019-03-02 LAB — CBC WITH DIFFERENTIAL/PLATELET
Abs Immature Granulocytes: 0.06 10*3/uL (ref 0.00–0.07)
Basophils Absolute: 0 10*3/uL (ref 0.0–0.1)
Basophils Relative: 0 %
Eosinophils Absolute: 0 10*3/uL (ref 0.0–0.5)
Eosinophils Relative: 0 %
HCT: 26.4 % — ABNORMAL LOW (ref 39.0–52.0)
Hemoglobin: 9.8 g/dL — ABNORMAL LOW (ref 13.0–17.0)
Immature Granulocytes: 1 %
Lymphocytes Relative: 13 %
Lymphs Abs: 1.2 10*3/uL (ref 0.7–4.0)
MCH: 31.5 pg (ref 26.0–34.0)
MCHC: 37.1 g/dL — ABNORMAL HIGH (ref 30.0–36.0)
MCV: 84.9 fL (ref 80.0–100.0)
Monocytes Absolute: 0.5 10*3/uL (ref 0.1–1.0)
Monocytes Relative: 6 %
Neutro Abs: 7.2 10*3/uL (ref 1.7–7.7)
Neutrophils Relative %: 80 %
Platelets: 17 10*3/uL — CL (ref 150–400)
RBC: 3.11 MIL/uL — ABNORMAL LOW (ref 4.22–5.81)
RDW: 13.9 % (ref 11.5–15.5)
WBC: 9 10*3/uL (ref 4.0–10.5)
nRBC: 0 % (ref 0.0–0.2)

## 2019-03-02 LAB — COMPREHENSIVE METABOLIC PANEL
ALT: 123 U/L — ABNORMAL HIGH (ref 0–44)
AST: 176 U/L — ABNORMAL HIGH (ref 15–41)
Albumin: 2 g/dL — ABNORMAL LOW (ref 3.5–5.0)
Alkaline Phosphatase: 352 U/L — ABNORMAL HIGH (ref 38–126)
Anion gap: 15 (ref 5–15)
BUN: 94 mg/dL — ABNORMAL HIGH (ref 6–20)
CO2: 20 mmol/L — ABNORMAL LOW (ref 22–32)
Calcium: 6.4 mg/dL — CL (ref 8.9–10.3)
Chloride: 106 mmol/L (ref 98–111)
Creatinine, Ser: 6.7 mg/dL — ABNORMAL HIGH (ref 0.61–1.24)
GFR calc Af Amer: 11 mL/min — ABNORMAL LOW (ref >60)
GFR calc non Af Amer: 10 mL/min — ABNORMAL LOW (ref >60)
Glucose, Bld: 156 mg/dL — ABNORMAL HIGH (ref 70–99)
Potassium: 4.5 mmol/L (ref 3.5–5.1)
Sodium: 141 mmol/L (ref 135–145)
Total Bilirubin: 1.5 mg/dL — ABNORMAL HIGH (ref 0.3–1.2)
Total Protein: 4.7 g/dL — ABNORMAL LOW (ref 6.5–8.1)

## 2019-03-02 LAB — AMMONIA: Ammonia: 98 umol/L — ABNORMAL HIGH (ref 9–35)

## 2019-03-02 LAB — BASIC METABOLIC PANEL
Anion gap: 15 (ref 5–15)
BUN: 88 mg/dL — ABNORMAL HIGH (ref 6–20)
CO2: 21 mmol/L — ABNORMAL LOW (ref 22–32)
Calcium: 7.1 mg/dL — ABNORMAL LOW (ref 8.9–10.3)
Chloride: 104 mmol/L (ref 98–111)
Creatinine, Ser: 5.54 mg/dL — ABNORMAL HIGH (ref 0.61–1.24)
GFR calc Af Amer: 14 mL/min — ABNORMAL LOW (ref >60)
GFR calc non Af Amer: 12 mL/min — ABNORMAL LOW (ref >60)
Glucose, Bld: 238 mg/dL — ABNORMAL HIGH (ref 70–99)
Potassium: 4.4 mmol/L (ref 3.5–5.1)
Sodium: 140 mmol/L (ref 135–145)

## 2019-03-02 LAB — PATHOLOGIST SMEAR REVIEW

## 2019-03-02 LAB — LACTIC ACID, PLASMA: Lactic Acid, Venous: 1.5 mmol/L (ref 0.5–1.9)

## 2019-03-02 LAB — CALCIUM, IONIZED: Calcium, Ionized, Serum: 3.4 mg/dL — ABNORMAL LOW (ref 4.5–5.6)

## 2019-03-02 LAB — C-REACTIVE PROTEIN: CRP: 16.7 mg/dL — ABNORMAL HIGH (ref <1.0)

## 2019-03-02 LAB — FERRITIN: Ferritin: 840 ng/mL — ABNORMAL HIGH (ref 24–336)

## 2019-03-02 LAB — GLUCOSE, CAPILLARY
Glucose-Capillary: 148 mg/dL — ABNORMAL HIGH (ref 70–99)
Glucose-Capillary: 263 mg/dL — ABNORMAL HIGH (ref 70–99)

## 2019-03-02 LAB — TROPONIN I (HIGH SENSITIVITY): Troponin I (High Sensitivity): 4455 ng/L (ref <18)

## 2019-03-02 LAB — MAGNESIUM: Magnesium: 1.6 mg/dL — ABNORMAL LOW (ref 1.7–2.4)

## 2019-03-02 LAB — ABO/RH: ABO/RH(D): A NEG

## 2019-03-02 LAB — D-DIMER, QUANTITATIVE: D-Dimer, Quant: 1.72 ug/mL-FEU — ABNORMAL HIGH (ref 0.00–0.50)

## 2019-03-02 MED ORDER — PRISMASOL BGK 4/2.5 32-4-2.5 MEQ/L REPLACEMENT SOLN: Status: DC

## 2019-03-02 MED ORDER — HEPARIN SODIUM (PORCINE) 1000 UNIT/ML DIALYSIS
1000.0000 [IU] | INTRAMUSCULAR | Status: DC | PRN
  Administered 2019-03-02: 2400 [IU] via INTRAVENOUS_CENTRAL
  Filled 2019-03-02 (×2): qty 6

## 2019-03-02 MED ORDER — MIDAZOLAM 50MG/50ML (1MG/ML) PREMIX INFUSION
0.5000 mg/h | INTRAVENOUS | Status: DC
  Administered 2019-03-02: 22:00:00 1 mg/h via INTRAVENOUS
  Administered 2019-03-03 – 2019-03-04 (×4): 5 mg/h via INTRAVENOUS
  Filled 2019-03-02 (×5): qty 50

## 2019-03-02 MED ORDER — DOPAMINE-DEXTROSE 3.2-5 MG/ML-% IV SOLN
2.5000 ug/kg/min | INTRAVENOUS | Status: DC
  Administered 2019-03-02 (×2): 2.5 ug/kg/min via INTRAVENOUS
  Filled 2019-03-02: qty 250

## 2019-03-02 MED ORDER — LACTULOSE 10 GM/15ML PO SOLN
30.0000 g | Freq: Two times a day (BID) | ORAL | Status: DC
  Administered 2019-03-02 – 2019-03-04 (×5): 30 g
  Filled 2019-03-02 (×5): qty 45

## 2019-03-02 MED ORDER — CALCIUM GLUCONATE-NACL 2-0.675 GM/100ML-% IV SOLN
2.0000 g | Freq: Once | INTRAVENOUS | Status: AC
  Administered 2019-03-02: 2000 mg via INTRAVENOUS
  Filled 2019-03-02: qty 100

## 2019-03-02 MED ORDER — SODIUM CHLORIDE 0.9% IV SOLUTION: Freq: Once | INTRAVENOUS | Status: AC

## 2019-03-02 MED ORDER — SODIUM CHLORIDE 0.9 % FOR CRRT
INTRAVENOUS_CENTRAL | Status: DC | PRN
  Filled 2019-03-02: qty 1000

## 2019-03-02 MED ORDER — ALTEPLASE 2 MG IJ SOLR
2.0000 mg | Freq: Once | INTRAMUSCULAR | Status: DC | PRN
  Filled 2019-03-02: qty 2

## 2019-03-02 MED ORDER — SODIUM CHLORIDE 0.9 % IV SOLN
2.0000 g | Freq: Two times a day (BID) | INTRAVENOUS | Status: AC
  Administered 2019-03-03 – 2019-03-04 (×4): 2 g via INTRAVENOUS
  Filled 2019-03-02 (×4): qty 2

## 2019-03-02 MED ORDER — FENTANYL CITRATE (PF) 100 MCG/2ML IJ SOLN
25.0000 ug | INTRAMUSCULAR | Status: DC | PRN
  Administered 2019-03-02 – 2019-03-03 (×7): 50 ug via INTRAVENOUS
  Administered 2019-03-04 – 2019-03-05 (×2): 25 ug via INTRAVENOUS
  Administered 2019-03-06 – 2019-03-09 (×18): 50 ug via INTRAVENOUS
  Filled 2019-03-02 (×29): qty 2

## 2019-03-02 MED ORDER — PRISMASOL BGK 4/2.5 32-4-2.5 MEQ/L IV SOLN: INTRAVENOUS | Status: DC

## 2019-03-02 NOTE — Progress Notes (Signed)
NAME:  Shawn Watkins, MRN:  [id number], DOB:  Apr 19, 1980, LOS: 4 ADMISSION DATE:  02/26/2019, CONSULTATION DATE:  1/11 REFERRING MD:  Kerry Hough, CHIEF COMPLAINT:  Dyspnea, DKA, COVID   Brief History   39 y/o male seen 1/3 with nausea, vomiting, sore throat and body aches.  Diagnosed with COVID, then left for home AMA.  Returned 1/11 to [facility], in DKA, intubated for delirium and transferred to [facility]  Past Medical History  DM I ETOH Abuse - prior alcoholic hepatitis, DT's with ETOH withdrawal seizures, pancreatitis  Depression   Significant Hospital Events   1/03 Seen in [facility] ER, dx with COVID, left AMA  1/11 Admitted with DKA, AMS, intubated.  Tx to [facility] from [facility] 1/11 hypotensive on 3 pressors  Consults:  TRH  PCCM   Procedures:  ETT 1/11 >>  R Fem TLC 1/11 >>  Significant Diagnostic Tests:  UDS 1/11 (post arrival to [facility]) >> pos BDZ ECHO 1/11 >> nml LV fn 1/12 CT abdomen/pelvis without contrast >> diffuse small bowel thickening, thickening of transverse colon?  Shock bowel, hepatic steatosis  Micro Data:  COVID 1/03 >> positive  BCx2 1/11 >> ng UA 1/11 >> negative, + glucose UC 1/11 >> ng  Antimicrobials:  Vanco 1/11 >> 1/13  Cefepime 1/11 >>    Interim history/subjective:   Will raise hands above head but no purposeful movement PLT 17 Oliguric On 5/5 on ventialtor  Objective   Blood pressure 98/79, pulse 78, temperature 99.9 F (37.7 C), resp. rate (!) 28, height 6\' 1"  (1.854 m), weight 98.6 kg, SpO2 92 %. CVP:  [0 mmHg-59 mmHg] 6 mmHg  Vent Mode: PRVC FiO2 (%):  [40 %] 40 % Set Rate:  [25 bmp] 25 bmp Vt Set:  [560 mL] 560 mL PEEP:  [5 cmH20-8 cmH20] 8 cmH20 Pressure Support:  [8 cmH20-12 cmH20] 8 cmH20 Plateau Pressure:  [11 cmH20-20 cmH20] 20 cmH20   Intake/Output Summary (Last 24 hours) at 03/02/2019 0805 Last data filed at 03/02/2019 0800 Gross per 24 hour  Intake 5391.93 ml  Output 420 ml  Net 4971.93 ml   Filed Weights   02/27/19 0456 03/01/19 0500  03/02/19 0447  Weight: 91.4 kg 98.8 kg 98.6 kg    Examination:  General:  In bed on vent HENT: NCAT ETT in place PULM: CTA B, vent supported breathing CV: RRR, no mgr GI: BS+, soft, nontender MSK: normal bulk and tone Neuro: drowsy, will open eyes and move spontaneously but doesn't follow commands     Resolved Hospital Problem list     Assessment & Plan:  DKA Type 1 DM SSI glargin Monitor glucose Tube feeding to continue  Shock, lactic acidosis> improving shock, lactic acidosis Wean off levophed for MAP > 65 Tube feeding to continue echo  Acute respriatory failure due to delirium Oxygenation and ventilatory mechanics favor extubation Wean off sedatin Pressure support as long as tolerated VAP prevention  Acute encephalopathy: toxic metabolic encephalopathy Alcohol abuse> father said he relapsed 01/2019 Hepatic encephalopathy Start lactulose RASS score 0 to -1 decadron  COVID 19 Monitor for fever/hypxoemia  Large pressure wound on buttocks Wound care consultation  Alcoholic hepatitis, acute Hepatic steatosis> alcohol related most likely Improving Monitor LFT  AKI> worsening, oliguric,  Rhabdomyolysis Start CRRT today after placing HD cath Needs plt prior to HD cath placement  Thrombocytopenia with associated acidosis, delirium, renal failure:likely DIC Transfuse today for HD cath   Best practice:  Diet: resume tube feeding Pain/Anxiety/Delirium protocol (if indicated): change  to O to -1 VAP protocol (if indicated): yes DVT prophylaxis: SCD GI prophylaxis: continue pantoprazole Glucose control: SSI Mobility: bed rest Code Status: Full Family Communication: called his dad today Disposition: remain in ICU  Labs   CBC: Recent Labs  Lab 02/26/19 0151 02/26/19 1022 02/27/19 0512 02/27/19 0512 02/27/19 2348 02/27/19 2348 02/28/19 0537 02/28/19 0546 03/01/19 0401 03/01/19 0533 03/01/19 1450 03/02/19 0400  WBC 10.3  --  31.2*  --   30.5*  --   --  28.6* 16.1*  --   --  9.0  NEUTROABS 5.3  --  26.6*  --   --   --   --  27.2* 14.8*  --   --  7.2  HGB 14.3   < > 12.6*   < > 11.3*   < > 9.9* 11.0* 9.9* 8.5*  --  9.8*  HCT 47.5   < > 36.1*   < > 31.4*   < > 29.0* 30.5* 27.2* 25.0*  --  26.4*  MCV 97.3  --  83.8  --  82.2  --   --  82.2 82.4  --   --  84.9  PLT 174  --  71*   < > 40*  --   --  32* 18*  --  17*  17* 17*   < > = values in this interval not displayed.    Basic Metabolic Panel: Recent Labs  Lab 02/26/19 0152 02/26/19 0539 02/27/19 1500 02/27/19 1500 02/27/19 2348 02/27/19 2348 02/28/19 0537 02/28/19 0546 03/01/19 0401 03/01/19 0533 03/02/19 0400  NA  --    < > 141   < > 142   < > 143 142 140 140 141  K  --    < > 3.8   < > 3.9   < > 3.8 4.2 4.2 4.2 4.5  CL  --    < > 98  --  100  --   --  99 102  --  106  CO2  --    < > 27  --  27  --   --  27 24  --  20*  GLUCOSE  --    < > 121*  --  80  --   --  118* 170*  --  156*  BUN  --    < > 44*  --  54*  --   --  57* 76*  --  94*  CREATININE  --    < > 4.63*  --  5.55*  --   --  5.63* 6.36*  --  6.70*  CALCIUM  --    < > 6.0*  --  6.0*  --   --  6.2* 5.9*  --  6.4*  MG 2.9*  --   --   --   --   --   --   --   --   --   --    < > = values in this interval not displayed.   GFR: Estimated Creatinine Clearance: 18.7 mL/min (A) (by C-G formula based on SCr of 6.7 mg/dL (H)). Recent Labs  Lab 02/26/19 0151 02/26/19 0539 02/27/19 0512 02/27/19 1500 02/27/19 2348 02/27/19 2349 02/28/19 0546 02/28/19 0547 03/01/19 0401 03/01/19 1100 03/02/19 0400  PROCALCITON 0.71  --   --   --   --   --   --   --   --   --   --   WBC 10.3   < >   < >  --  30.5*  --  28.6*  --  16.1*  --  9.0  LATICACIDVEN  --    < >  --  5.4*  --  5.0*  --  3.8*  --  2.5*  --    < > = values in this interval not displayed.    Liver Function Tests: Recent Labs  Lab 02/26/19 0151 02/27/19 0512 02/28/19 0546 03/01/19 0401 03/02/19 0400  AST 504* 883* 559* 343* 176*  ALT 150*  240* 210* 153* 123*  ALKPHOS 197* 144* 156* 189* 352*  BILITOT 1.4* 1.6* 1.4* 2.0* 1.5*  PROT 6.3* 4.1* 4.5* 4.5* 4.7*  ALBUMIN 3.2* 2.1* 2.3* 2.1* 2.0*   Recent Labs  Lab 02/26/19 0409 02/27/19 1530  LIPASE 55* 18   No results for input(s): AMMONIA in the last 168 hours.  ABG    Component Value Date/Time   PHART 7.541 (H) 03/01/2019 0533   PCO2ART 26.1 (L) 03/01/2019 0533   PO2ART 80.0 (L) 03/01/2019 0533   HCO3 22.5 03/01/2019 0533   TCO2 23 03/01/2019 0533   ACIDBASEDEF 15.0 (H) 02/26/2019 1022   O2SAT 98.0 03/01/2019 0533     Coagulation Profile: Recent Labs  Lab 03/01/19 1450  INR 1.5*    Cardiac Enzymes: Recent Labs  Lab 03/01/19 1330  CKTOTAL 1,720*    HbA1C: Hgb A1c MFr Bld  Date/Time Value Ref Range Status  02/27/2019 03:30 PM 12.2 (H) 4.8 - 5.6 % Final    Comment:    (NOTE) Pre diabetes:          5.7%-6.4% Diabetes:              >6.4% Glycemic control for   <7.0% adults with diabetes   02/06/2017 01:10 PM 9.8 (H) 4.8 - 5.6 % Final    Comment:    (NOTE) Pre diabetes:          5.7%-6.4% Diabetes:              >6.4% Glycemic control for   <7.0% adults with diabetes     CBG: Recent Labs  Lab 03/01/19 1252 03/01/19 1640 03/01/19 2011 03/01/19 2342 03/02/19 0312  GLUCAP 173* 181* 162* 165* 148*     Critical care time: 33 minutes     Roselie Awkward, MD [place] [facility] Pager: [phone] Cell: [phone] If no response, call [phone]

## 2019-03-02 NOTE — Progress Notes (Signed)
MD Dr. Warrick Parisian notify

## 2019-03-02 NOTE — Progress Notes (Signed)
[facility] Progress Note  Subjective: creat up to 6, CCM putting line in for CRRT  Vitals:   03/02/19 0700 03/02/19 0800 03/02/19 0832 03/02/19 1035  BP: 106/80 109/88 107/69   Pulse: 72 71 78 71  Resp: (!) 25 (!) 24 (!) 38 (!) 22  Temp:  99.5 F (37.5 C)  99.5 F (37.5 C)  TempSrc:  Bladder  Bladder  SpO2: 92% 95% 94% 95%  Weight:      Height:        Exam:   Patient not examined today directly given COVID-19 + status, utilizing data taken from chart +/- discussions w/ providers and staff.      Home meds:  - insulin glargine 15u hs/ insulin lispro 5 tid sq     I/O totals + 21 L    +4.9 L yest  CXR 1/15 moderate R effusion   plts down 17k, smear w/o shistocytes   levo gtt at 12   CVP 19- 23   FiO2 40%, peep 5  Assessment/ Plan: 1. AKI - in setting of DKA and COVID infection. Shock is likely cause of ATN, oliguric. Plan start CRRT today. Pull some vol (50- 100 cc/hr) if BP's will tolerate 2. COVID + infection 3. Shock - due to sepsis 4. DKA - per CCM 5. ETOH abuse 6. Vol - up 15- 20 kg     Rob Birch Farino 03/02/2019, 1:31 PM  Inpatient medications: . chlorhexidine gluconate (MEDLINE KIT)  15 mL Mouth Rinse BID  . Chlorhexidine Gluconate Cloth  6 each Topical Daily  . folic acid  1 mg Per Tube Daily  . insulin aspart  0-9 Units Subcutaneous Q4H  . insulin glargine  5 Units Subcutaneous Daily  . lactulose  30 g Per Tube BID  . mouth rinse  15 mL Mouth Rinse 10 times per day  . pantoprazole (PROTONIX) IV  40 mg Intravenous Q12H  . thiamine  100 mg Per Tube Daily   . sodium chloride 100 mL/hr at 03/02/19 0800  . ceFEPime (MAXIPIME) IV 2 g (03/02/19 1247)  . dexmedetomidine (PRECEDEX) IV infusion 0.5 mcg/kg/hr (03/02/19 0800)  . feeding supplement (VITAL AF 1.2 CAL)    . norepinephrine (LEVOPHED) Adult infusion 12 mcg/min (03/02/19 0800)  . phenylephrine (NEO-SYNEPHRINE) Adult infusion Stopped (02/28/19 1223)  . vasopressin (PITRESSIN)  infusion - *FOR SHOCK* Stopped (02/28/19 2325)   acetaminophen, dextrose, docusate, fentaNYL (SUBLIMAZE) injection, midazolam, ondansetron (ZOFRAN) IV

## 2019-03-02 NOTE — Progress Notes (Signed)
Received message from Annye English [facility] with Patient Experience regarding questions from the patient's father Dushaun Okey. TCT Josip Sr [phone]); Patient's parents are divorced. Camren Sr is the point person to call first. He will relay the information to his son and his son will give the information to the patient's mother.All questions answered. Abelino Derrick [facility] Advanced Care Supervisor

## 2019-03-02 NOTE — Progress Notes (Signed)
Notified Dr. Karren Burly through [facility] of critical lab value: Calcium 6.4

## 2019-03-02 NOTE — Progress Notes (Signed)
eLink Physician-Brief Progress Note Patient Name: MCKINNON GLICK DOB: [birth year] MRN: [id number]   Date of Service  03/02/2019  HPI/Events of Note  Bradycardia on Precedex  eICU Interventions  Precedex discontinued, Versed substituted, Dopamine at 2.5 mcg for  Bradycardia into the 40's.        [name] Zebulan Hinshaw 03/02/2019, 9:25 PM

## 2019-03-02 NOTE — Procedures (Signed)
Hemodialysis Catheter Insertion Procedure Note Shawn Watkins [id number] 1980/12/02  Procedure: Insertion of Hemodialysis Catheter Indications: Hemodialysis  Procedure Details Consent: Risks of procedure as well as the alternatives and risks of each were explained to the (patient/caregiver).  Consent for procedure obtained.  Time Out: Verified patient identification, verified procedure, site/side was marked, verified correct patient position, special equipment/implants available, medications/allergies/relevent history reviewed, required imaging and test results available.  Performed  Maximum sterile technique was used including antiseptics, cap, gloves, gown, hand hygiene, mask and sheet.  Skin prep: Chlorhexidine; local anesthetic administered  A Trialysis HD catheter was placed in the right internal jugular vein using the Seldinger technique.  Biopatch applied and sutured in place.   Evaluation Blood flow good Complications: No apparent complications Patient did tolerate procedure well. Chest X-ray ordered to verify placement.  CXR: normal.   Procedure performed under direct supervision of Dr. Kendrick Fries and with ultrasound guidance for real time vessel cannulation.     Canary Brim, NP-C [facility] Pgr: [phone] or [phone] 03/02/2019, 2:48 PM

## 2019-03-02 NOTE — Progress Notes (Signed)
eLink Physician-Brief Progress Note Patient Name: Shawn Watkins DOB: 10/22/1980 MRN: [id number]   Date of Service  03/02/2019  HPI/Events of Note  Pt with bigeminy on low dose Dopamine  eICU Interventions  Hold Dopamine infusion and discontinue it if HR remains above 50 bpm off the dopamine, check BMET and magnesium.        [name] [name] 03/02/2019, 10:32 PM

## 2019-03-02 NOTE — Progress Notes (Signed)
Patient's father updated via phone today.  All questions answered.  CRRT initiated today and placement of TDC.  Patient does wake up, but not appropriately.  Does not respond to commands at this time.  Has been on CPAP majority of the day with good TV and O2 sats > 90%.  Patient requiresw minimal support with 5/5 pressure support.  Plan for CRRT through night. Foley and central lines in groins removed.

## 2019-03-02 NOTE — Progress Notes (Signed)
Patient very agitated, raising BUE and reaching for ETT. Opens eyes and looks around room, but will not follow commands at this time. Versed IVP administered for RASS goal of 0 to -1.

## 2019-03-02 NOTE — Progress Notes (Addendum)
Pharmacy Antibiotic Note  Shawn Watkins is a 39 y.o. male admitted on 02/26/2019 with COVID-19 pneumonia, sepsis.  Pharmacy has been consulted for empiric Cefepime dosing. D#5 antibiotics CRRT starting 1/15 - SCr increased to 6.7 WBC 10.3 > 31.2 > 9  Plan: Change to Cefepime 2gm IV q12h while on CRRT. Follow up duration Will f/u renal function, micro data, and pt's clinical condition   Height: 6\' 1"  (185.4 cm) Weight: 217 lb 6 oz (98.6 kg) IBW/kg (Calculated) : 79.9  Temp (24hrs), Avg:99.1 F (37.3 C), Min:97.9 F (36.6 C), Max:99.9 F (37.7 C)  Recent Labs  Lab 02/26/19 1815 02/27/19 0512 02/27/19 1500 02/27/19 2348 02/27/19 2349 02/28/19 0546 02/28/19 0547 03/01/19 0401 03/01/19 1100 03/02/19 0400 03/02/19 0920  WBC  --  31.2*  --  30.5*  --  28.6*  --  16.1*  --  9.0  --   CREATININE   < > 4.20* 4.63* 5.55*  --  5.63*  --  6.36*  --  6.70*  --   LATICACIDVEN   < >  --  5.4*  --  5.0*  --  3.8*  --  2.5*  --  1.5  VANCORANDOM  --   --   --   --   --  17  --   --   --   --   --    < > = values in this interval not displayed.    Estimated Creatinine Clearance: 18.7 mL/min (A) (by C-G formula based on SCr of 6.7 mg/dL (H)).    No Known Allergies  Antimicrobials this admission: 1/11 Cefepime >>  1/11 Vanc >> 1/13  Microbiology results: 1/11 BCx: ngtd 1/11 UCx:  NGF 1/12 MRSA PCR: neg  Thank you for allowing pharmacy to be a part of this patient's care.  3/12 PharmD, BCPS Clinical pharmacist phone 7am- 5pm: [phone] 03/02/2019 1:53 PM

## 2019-03-03 ENCOUNTER — Other Ambulatory Visit: Payer: Self-pay

## 2019-03-03 LAB — RENAL FUNCTION PANEL
Albumin: 2.1 g/dL — ABNORMAL LOW (ref 3.5–5.0)
Albumin: 2.1 g/dL — ABNORMAL LOW (ref 3.5–5.0)
Anion gap: 10 (ref 5–15)
Anion gap: 7 (ref 5–15)
BUN: 83 mg/dL — ABNORMAL HIGH (ref 6–20)
BUN: 69 mg/dL — ABNORMAL HIGH (ref 6–20)
CO2: 25 mmol/L (ref 22–32)
CO2: 27 mmol/L (ref 22–32)
Calcium: 7.1 mg/dL — ABNORMAL LOW (ref 8.9–10.3)
Calcium: 7.1 mg/dL — ABNORMAL LOW (ref 8.9–10.3)
Chloride: 105 mmol/L (ref 98–111)
Chloride: 106 mmol/L (ref 98–111)
Creatinine, Ser: 4.88 mg/dL — ABNORMAL HIGH (ref 0.61–1.24)
Creatinine, Ser: 4.02 mg/dL — ABNORMAL HIGH (ref 0.61–1.24)
GFR calc Af Amer: 16 mL/min — ABNORMAL LOW (ref >60)
GFR calc Af Amer: 21 mL/min — ABNORMAL LOW (ref >60)
GFR calc non Af Amer: 14 mL/min — ABNORMAL LOW (ref >60)
GFR calc non Af Amer: 18 mL/min — ABNORMAL LOW (ref >60)
Glucose, Bld: 224 mg/dL — ABNORMAL HIGH (ref 70–99)
Glucose, Bld: 163 mg/dL — ABNORMAL HIGH (ref 70–99)
Phosphorus: 2 mg/dL — ABNORMAL LOW (ref 2.5–4.6)
Phosphorus: 1.3 mg/dL — ABNORMAL LOW (ref 2.5–4.6)
Potassium: 4.3 mmol/L (ref 3.5–5.1)
Potassium: 3.8 mmol/L (ref 3.5–5.1)
Sodium: 140 mmol/L (ref 135–145)
Sodium: 140 mmol/L (ref 135–145)

## 2019-03-03 LAB — TROPONIN I (HIGH SENSITIVITY)
Troponin I (High Sensitivity): 5042 ng/L (ref <18)
Troponin I (High Sensitivity): 3547 ng/L (ref <18)

## 2019-03-03 LAB — CBC WITH DIFFERENTIAL/PLATELET
Abs Immature Granulocytes: 0.05 10*3/uL (ref 0.00–0.07)
Basophils Absolute: 0 10*3/uL (ref 0.0–0.1)
Basophils Relative: 0 %
Eosinophils Absolute: 0 10*3/uL (ref 0.0–0.5)
Eosinophils Relative: 0 %
HCT: 24.1 % — ABNORMAL LOW (ref 39.0–52.0)
Hemoglobin: 8.5 g/dL — ABNORMAL LOW (ref 13.0–17.0)
Immature Granulocytes: 1 %
Lymphocytes Relative: 18 %
Lymphs Abs: 1.4 10*3/uL (ref 0.7–4.0)
MCH: 29.4 pg (ref 26.0–34.0)
MCHC: 35.3 g/dL (ref 30.0–36.0)
MCV: 83.4 fL (ref 80.0–100.0)
Monocytes Absolute: 1 10*3/uL (ref 0.1–1.0)
Monocytes Relative: 12 %
Neutro Abs: 5.5 10*3/uL (ref 1.7–7.7)
Neutrophils Relative %: 69 %
Platelets: 28 10*3/uL — CL (ref 150–400)
RBC: 2.89 MIL/uL — ABNORMAL LOW (ref 4.22–5.81)
RDW: 14 % (ref 11.5–15.5)
WBC: 7.9 10*3/uL (ref 4.0–10.5)
nRBC: 0 % (ref 0.0–0.2)

## 2019-03-03 LAB — GLUCOSE, CAPILLARY
Glucose-Capillary: 198 mg/dL — ABNORMAL HIGH (ref 70–99)
Glucose-Capillary: 223 mg/dL — ABNORMAL HIGH (ref 70–99)
Glucose-Capillary: 196 mg/dL — ABNORMAL HIGH (ref 70–99)
Glucose-Capillary: 199 mg/dL — ABNORMAL HIGH (ref 70–99)
Glucose-Capillary: 208 mg/dL — ABNORMAL HIGH (ref 70–99)
Glucose-Capillary: 240 mg/dL — ABNORMAL HIGH (ref 70–99)
Glucose-Capillary: 257 mg/dL — ABNORMAL HIGH (ref 70–99)

## 2019-03-03 LAB — CULTURE, BLOOD (ROUTINE X 2)
Culture: NO GROWTH
Culture: NO GROWTH
Special Requests: ADEQUATE

## 2019-03-03 LAB — HEPATIC FUNCTION PANEL
ALT: 93 U/L — ABNORMAL HIGH (ref 0–44)
AST: 94 U/L — ABNORMAL HIGH (ref 15–41)
Albumin: 2.1 g/dL — ABNORMAL LOW (ref 3.5–5.0)
Alkaline Phosphatase: 335 U/L — ABNORMAL HIGH (ref 38–126)
Bilirubin, Direct: 0.4 mg/dL — ABNORMAL HIGH (ref 0.0–0.2)
Indirect Bilirubin: 0.5 mg/dL (ref 0.3–0.9)
Total Bilirubin: 0.9 mg/dL (ref 0.3–1.2)
Total Protein: 4.7 g/dL — ABNORMAL LOW (ref 6.5–8.1)

## 2019-03-03 LAB — FERRITIN: Ferritin: 480 ng/mL — ABNORMAL HIGH (ref 24–336)

## 2019-03-03 LAB — CALCIUM, IONIZED: Calcium, Ionized, Serum: 3.1 mg/dL — ABNORMAL LOW (ref 4.5–5.6)

## 2019-03-03 LAB — C-REACTIVE PROTEIN: CRP: 14.7 mg/dL — ABNORMAL HIGH (ref <1.0)

## 2019-03-03 LAB — D-DIMER, QUANTITATIVE: D-Dimer, Quant: 5.37 ug/mL-FEU — ABNORMAL HIGH (ref 0.00–0.50)

## 2019-03-03 LAB — APTT: aPTT: 25 seconds (ref 24–36)

## 2019-03-03 LAB — MAGNESIUM: Magnesium: 1.9 mg/dL (ref 1.7–2.4)

## 2019-03-03 MED ORDER — OXYCODONE HCL 5 MG/5ML PO SOLN
5.0000 mg | Freq: Four times a day (QID) | ORAL | Status: DC | PRN
  Administered 2019-03-03: 5 mg
  Filled 2019-03-03: qty 5

## 2019-03-03 MED ORDER — CLONAZEPAM 0.5 MG PO TABS
0.5000 mg | ORAL_TABLET | Freq: Once | ORAL | Status: AC
  Administered 2019-03-03: 0.5 mg
  Filled 2019-03-03: qty 1

## 2019-03-03 MED ORDER — MAGNESIUM SULFATE 2 GM/50ML IV SOLN
2.0000 g | Freq: Once | INTRAVENOUS | Status: AC
  Administered 2019-03-03: 2 g via INTRAVENOUS
  Filled 2019-03-03: qty 50

## 2019-03-03 MED ORDER — QUETIAPINE FUMARATE 50 MG PO TABS
50.0000 mg | ORAL_TABLET | Freq: Two times a day (BID) | ORAL | Status: DC
  Administered 2019-03-03 (×2): 50 mg
  Filled 2019-03-03 (×3): qty 1

## 2019-03-03 MED ORDER — CLONAZEPAM 0.5 MG PO TABS
0.5000 mg | ORAL_TABLET | Freq: Two times a day (BID) | ORAL | Status: DC
  Administered 2019-03-03: 0.5 mg
  Filled 2019-03-03 (×2): qty 1

## 2019-03-03 NOTE — Progress Notes (Signed)
NAME:  Shawn Watkins, MRN:  [id number], DOB:  02-26-1980, LOS: 5 ADMISSION DATE:  02/26/2019, CONSULTATION DATE:  1/11 REFERRING MD:  Kerry Hough, CHIEF COMPLAINT:  Dyspnea, DKA, COVID   Brief History   39 y/o male seen 1/3 with nausea, vomiting, sore throat and body aches.  Diagnosed with COVID, then left for home AMA.  Returned 1/11 to [facility], in DKA, intubated for delirium and transferred to [facility].  Past Medical History  DM I ETOH Abuse - prior alcoholic hepatitis, DT's with ETOH withdrawal seizures, pancreatitis  Depression   Significant Hospital Events   1/03 Seen in [facility] ER, dx with COVID, left AMA  1/11 Admitted with DKA, AMS, intubated.  Tx to [facility] from [facility] 1/11 hypotensive on 3 pressors  Consults:  TRH  PCCM   Procedures:  ETT 1/11 >>  R Fem TLC 1/11 >> Right IJ HD catheter 1/15 >>   Significant Diagnostic Tests:  UDS 1/11 (post arrival to [facility]) >> pos BDZ ECHO 1/11 >> nml LV fn 1/12 CT abdomen/pelvis without contrast >> diffuse small bowel thickening, thickening of transverse colon?  Shock bowel, hepatic steatosis  Micro Data:  COVID 1/03 >> positive  BCx2 1/11 >> ng UA 1/11 >> negative, + glucose UC 1/11 >> ng  Antimicrobials:  Vanco 1/11 >> 1/13  Cefepime 1/11 >>    Interim history/subjective:  CVVH initiated on 1/15 Bradycardia overnight on Precedex, changed to Versed Dopamine added but resulted in bigeminy, held Magnesium replaced an echocardiogram ordered   Objective   Blood pressure (!) 84/71, pulse 78, temperature 97.7 F (36.5 C), temperature source Oral, resp. rate (!) 21, height 6\' 1"  (1.854 m), weight 98.6 kg, SpO2 100 %.    Vent Mode: PRVC FiO2 (%):  [40 %-60 %] 60 % Set Rate:  [25 bmp] 25 bmp Vt Set:  [560 mL] 560 mL PEEP:  [5 cmH20] 5 cmH20 Pressure Support:  [5 cmH20] 5 cmH20 Plateau Pressure:  [19 cmH20-34 cmH20] 34 cmH20   Intake/Output Summary (Last 24 hours) at 03/03/2019 0831 Last data filed at 03/03/2019 0700 Gross per 24 hour  Intake  2198.52 ml  Output 2127 ml  Net 71.52 ml   Filed Weights   03/01/19 0500 03/02/19 0447 03/03/19 0500  Weight: 98.8 kg 98.6 kg 98.6 kg    Examination:  General: Chronically ill-appearing man, ventilated, on CVVH HENT: ET tube in place, oropharynx otherwise clear PULM: Coarse bilaterally, no wheezing CV: Regular, tachycardic, distant, no murmur GI: Firm, somewhat distended, positive bowel sounds, rectal tube in place MSK: No deformities Neuro: Eyes open, tracks, nods to questions, intermittent agitation    Resolved Hospital Problem list     Assessment & Plan:  DKA Type 1 DM Now on sliding scale insulin Lantus 5 units daily Follow CBG On tube feeding, may need to consider adding tube feed coverage  Shock, lactic acidosis> both improved.  Echocardiogram 1/11 reassuring Wean off levophed for MAP > 65, increased slightly 1/15 after CVVH initiated No evidence overt bowel ischemia, tolerating tube feeding Reassuring echocardiogram 1/11, defer repeat  Bradycardia, likely due to sedating medications Precedex changed to Versed Check and replace electrolytes as indicated Defer repeat echocardiogram 1/16  Stress non-STEMI Supportive care, follow troponin peaks  Acute respriatory failure due to delirium Oxygenation and ventilatory mechanics favor extubation FiO2 currently 0.60, PEEP 5, wean as able Minimize sedation given improved gas exchange Would benefit from volume removal if hemodynamics will allow Goal SBT's, possible extubation through the weekend VAP prevention order  set  Acute encephalopathy: toxic metabolic encephalopathy Alcohol abuse> father said he relapsed 01/2019 Hepatic encephalopathy Continue lactulose RASS goal -1 Dexamethasone Precedex was changed to Versed infusion overnight 1/15 as described above.  Start low-dose clonazepam, Seroquel, try to get the Versed to off.  COVID 19 Dexamethasone as ordered  Large pressure wound on buttocks Appreciate  wound care assistance  Alcoholic hepatitis, acute Hepatic steatosis> alcohol related most likely On lactulose Following LFTs  AKI> worsening, oliguric,  Rhabdomyolysis Continue CVVHD, appreciate renal management Volume removal as hemodynamics will allow, +21.7 L  Thrombocytopenia with associated acidosis, delirium, renal failure:likely DIC  follow CBC, no evidence of active bleeding at this time   Best practice:  Diet: resume tube feeding Pain/Anxiety/Delirium protocol (if indicated): RASS goal -1 VAP protocol (if indicated): yes DVT prophylaxis: SCD GI prophylaxis: continue pantoprazole Glucose control: SSI Mobility: bed rest Code Status: Full Family Communication: I updated his father by phone 1/16 Disposition: remain in ICU  Labs   CBC: Recent Labs  Lab 02/27/19 0512 02/27/19 0512 02/27/19 2348 02/28/19 0537 02/28/19 0546 03/01/19 0401 03/01/19 0533 03/01/19 1450 03/02/19 0400 03/03/19 0515  WBC 31.2*   < > 30.5*  --  28.6* 16.1*  --   --  9.0 7.9  NEUTROABS 26.6*  --   --   --  27.2* 14.8*  --   --  7.2 5.5  HGB 12.6*   < > 11.3*   < > 11.0* 9.9* 8.5*  --  9.8* 8.5*  HCT 36.1*   < > 31.4*   < > 30.5* 27.2* 25.0*  --  26.4* 24.1*  MCV 83.8   < > 82.2  --  82.2 82.4  --   --  84.9 83.4  PLT 71*   < > 40*  --  32* 18*  --  17*  17* 17* 28*   < > = values in this interval not displayed.    Basic Metabolic Panel: Recent Labs  Lab 02/26/19 0152 02/26/19 0539 02/28/19 0546 02/28/19 0546 03/01/19 0401 03/01/19 0533 03/02/19 0400 03/02/19 2202 03/03/19 0515  NA  --    < > 142   < > 140 140 141 140 140  K  --    < > 4.2   < > 4.2 4.2 4.5 4.4 4.3  CL  --    < > 99  --  102  --  106 104 105  CO2  --    < > 27  --  24  --  20* 21* 25  GLUCOSE  --    < > 118*  --  170*  --  156* 238* 224*  BUN  --    < > 57*  --  76*  --  94* 88* 83*  CREATININE  --    < > 5.63*  --  6.36*  --  6.70* 5.54* 4.88*  CALCIUM  --    < > 6.2*  --  5.9*  --  6.4* 7.1* 7.1*  MG  2.9*  --   --   --   --   --   --  1.6* 1.9  PHOS  --   --   --   --   --   --   --   --  2.0*   < > = values in this interval not displayed.   GFR: Estimated Creatinine Clearance: 25.6 mL/min (A) (by C-G formula based on SCr of 4.88 mg/dL (H)). Recent Labs  Lab 02/26/19 0151 02/26/19 0539 02/27/19 2348 02/27/19 2349 02/28/19 0546 02/28/19 0547 03/01/19 0401 03/01/19 1100 03/02/19 0400 03/02/19 0920 03/03/19 0515  PROCALCITON 0.71  --   --   --   --   --   --   --   --   --   --   WBC 10.3   < >   < >  --  28.6*  --  16.1*  --  9.0  --  7.9  LATICACIDVEN  --    < >  --  5.0*  --  3.8*  --  2.5*  --  1.5  --    < > = values in this interval not displayed.    Liver Function Tests: Recent Labs  Lab 02/27/19 0512 02/28/19 0546 03/01/19 0401 03/02/19 0400 03/03/19 0515  AST 883* 559* 343* 176* 94*  ALT 240* 210* 153* 123* 93*  ALKPHOS 144* 156* 189* 352* 335*  BILITOT 1.6* 1.4* 2.0* 1.5* 0.9  PROT 4.1* 4.5* 4.5* 4.7* 4.7*  ALBUMIN 2.1* 2.3* 2.1* 2.0* 2.1*  2.1*   Recent Labs  Lab 02/26/19 0409 02/27/19 1530  LIPASE 55* 18   Recent Labs  Lab 03/02/19 0400  AMMONIA 98*    ABG    Component Value Date/Time   PHART 7.541 (H) 03/01/2019 0533   PCO2ART 26.1 (L) 03/01/2019 0533   PO2ART 80.0 (L) 03/01/2019 0533   HCO3 22.5 03/01/2019 0533   TCO2 23 03/01/2019 0533   ACIDBASEDEF 15.0 (H) 02/26/2019 1022   O2SAT 98.0 03/01/2019 0533     Coagulation Profile: Recent Labs  Lab 03/01/19 1450  INR 1.5*    Cardiac Enzymes: Recent Labs  Lab 03/01/19 1330  CKTOTAL 1,720*    HbA1C: Hgb A1c MFr Bld  Date/Time Value Ref Range Status  02/27/2019 03:30 PM 12.2 (H) 4.8 - 5.6 % Final    Comment:    (NOTE) Pre diabetes:          5.7%-6.4% Diabetes:              >6.4% Glycemic control for   <7.0% adults with diabetes   02/06/2017 01:10 PM 9.8 (H) 4.8 - 5.6 % Final    Comment:    (NOTE) Pre diabetes:          5.7%-6.4% Diabetes:               >6.4% Glycemic control for   <7.0% adults with diabetes     CBG: Recent Labs  Lab 03/01/19 2011 03/01/19 2342 03/02/19 0312 03/02/19 1702 03/03/19 0744  GLUCAP 162* 165* 148* 263* 198*     Critical care time: 33  minutes      Baltazar Apo, MD, PhD 03/03/2019, 8:41 AM [facility] [phone] or if no answer [phone]

## 2019-03-03 NOTE — Progress Notes (Signed)
Attempted CPAP wean with pt. Pt did not tolerated due to agitation and RR of 45. Pt returned to full support

## 2019-03-03 NOTE — Progress Notes (Signed)
eLink Physician-Brief Progress Note Patient Name: Shawn Watkins DOB: 10-20-1980 MRN: [id number]   Date of Service  03/03/2019  HPI/Events of Note  Magnesium 1.6 meq/L Troponin > 4000  eICU Interventions  MgSo4 2 gm iv  X 1, Limited ECHO in a.m. r/o ACS        [name] 03/03/2019, 12:06 AM

## 2019-03-03 NOTE — Progress Notes (Signed)
[facility] Progress Note  Subjective: on CRRT, pressors cont to wean on levo only at 4ug/hr , pt waking up  Vitals:   03/03/19 1015 03/03/19 1030 03/03/19 1045 03/03/19 1100  BP: 127/63  98/64 118/77  Pulse: 74 80 83 87  Resp: 20 (!) 23 (!) 23 (!) 23  Temp:      TempSrc:      SpO2: 100% 100% 98% 97%  Weight:      Height:        Exam:  temp cath for crrt   Groggy but eyes open, tracks    ETT on vent    Chest coarse bilat BS    Cor reg     Abd no ascites    Ext diffuse 3+ edema     Home meds:  - insulin glargine 15u hs/ insulin lispro 5 tid sq     I/O totals + 21 L    I/O kept even yesterady  CXR 1/15 moderate R effusion   plts up 38 k, smear w/o shistocytes   levo gtt down 4 ug/min   CVP not done   FiO2 40%, peep 5  Assessment/ Plan: 1. AKI - in setting of DKA and COVID infection. Shock is likely cause of ATN, oliguric. CPK was up slightly 1750 but not responsible for AKI. CRRT started on 1/15.  - cont to pull vol as tolerated, up to -100 cc/hr this am - pressors weaning , on levo a 4ug only today - cont all 4/2.5 fluids pre/post/dialysate - dc NS at 100 / hr - not getting any IV or systemic heparin, very low plts   2. COVID + infection 3. Thrombocytopenia - nadir 17k, up to 28 today. Likely DIC, smear neg for schistocytes 1/14 4. Shock - improving, pressors almost off 5. DKA - per CCM 6. Alcoholic hepatitis 7. Vol excess - as above     Rob  03/03/2019, 11:11 AM  Inpatient medications: . chlorhexidine gluconate (MEDLINE KIT)  15 mL Mouth Rinse BID  . Chlorhexidine Gluconate Cloth  6 each Topical Daily  . clonazePAM  0.5 mg Per Tube BID  . folic acid  1 mg Per Tube Daily  . insulin aspart  0-9 Units Subcutaneous Q4H  . insulin glargine  5 Units Subcutaneous Daily  . lactulose  30 g Per Tube BID  . mouth rinse  15 mL Mouth Rinse 10 times per day  . pantoprazole (PROTONIX) IV  40 mg Intravenous Q12H  . QUEtiapine  50 mg Per  Tube BID  . thiamine  100 mg Per Tube Daily   .  prismasol BGK 4/2.5 400 mL/hr at 03/02/19 1537  .  prismasol BGK 4/2.5 200 mL/hr at 03/02/19 1536  . sodium chloride Stopped (03/02/19 1558)  . ceFEPime (MAXIPIME) IV Stopped (03/03/19 0100)  . feeding supplement (VITAL AF 1.2 CAL) 80 mL/hr at 03/03/19 0700  . midazolam 4 mg/hr (03/03/19 1100)  . norepinephrine (LEVOPHED) Adult infusion 4 mcg/min (03/03/19 1100)  . phenylephrine (NEO-SYNEPHRINE) Adult infusion Stopped (02/28/19 1223)  . prismasol BGK 4/2.5 2,200 mL/hr at 03/03/19 0930  . vasopressin (PITRESSIN) infusion - *FOR SHOCK* Stopped (02/28/19 2325)   acetaminophen, alteplase, dextrose, docusate, fentaNYL (SUBLIMAZE) injection, heparin, midazolam, ondansetron (ZOFRAN) IV, sodium chloride        

## 2019-03-03 NOTE — Progress Notes (Signed)
Father of patient, Janene Harvey., updated over the phone regarding patient's condition.

## 2019-03-03 NOTE — Progress Notes (Signed)
Patient's father updated again this evening regarding patient's condition.

## 2019-03-03 NOTE — Progress Notes (Signed)
Patient complaining of chest pain and tachypneic. Fentanyl given. 12 lead done. Troponins elevated per labs. MD aware.   Orders for oxy 5 mg per tube q6PRN given.

## 2019-03-03 NOTE — Progress Notes (Signed)
Patient c/o nausea. Tube feeds stopped and OG put to LIS.   1400 -- 500 suctioned out.  1430 -- MD aware. Continue to hold TF.

## 2019-03-04 ENCOUNTER — Inpatient Hospital Stay (HOSPITAL_COMMUNITY): Payer: HRSA Program

## 2019-03-04 DIAGNOSIS — I361 Nonrheumatic tricuspid (valve) insufficiency: Secondary | ICD-10-CM

## 2019-03-04 LAB — RENAL FUNCTION PANEL
Albumin: 2 g/dL — ABNORMAL LOW (ref 3.5–5.0)
Anion gap: 8 (ref 5–15)
BUN: 50 mg/dL — ABNORMAL HIGH (ref 6–20)
CO2: 27 mmol/L (ref 22–32)
Calcium: 7.1 mg/dL — ABNORMAL LOW (ref 8.9–10.3)
Chloride: 104 mmol/L (ref 98–111)
Creatinine, Ser: 3.17 mg/dL — ABNORMAL HIGH (ref 0.61–1.24)
GFR calc Af Amer: 27 mL/min — ABNORMAL LOW (ref >60)
GFR calc non Af Amer: 24 mL/min — ABNORMAL LOW (ref >60)
Glucose, Bld: 121 mg/dL — ABNORMAL HIGH (ref 70–99)
Phosphorus: 1.5 mg/dL — ABNORMAL LOW (ref 2.5–4.6)
Potassium: 4 mmol/L (ref 3.5–5.1)
Sodium: 139 mmol/L (ref 135–145)

## 2019-03-04 LAB — ECHOCARDIOGRAM LIMITED
Height: 73 in
Weight: 3467.39 oz

## 2019-03-04 LAB — HEPATIC FUNCTION PANEL
ALT: 87 U/L — ABNORMAL HIGH (ref 0–44)
AST: 98 U/L — ABNORMAL HIGH (ref 15–41)
Albumin: 1.9 g/dL — ABNORMAL LOW (ref 3.5–5.0)
Alkaline Phosphatase: 245 U/L — ABNORMAL HIGH (ref 38–126)
Bilirubin, Direct: 0.3 mg/dL — ABNORMAL HIGH (ref 0.0–0.2)
Indirect Bilirubin: 1.1 mg/dL — ABNORMAL HIGH (ref 0.3–0.9)
Total Bilirubin: 1.4 mg/dL — ABNORMAL HIGH (ref 0.3–1.2)
Total Protein: 4.4 g/dL — ABNORMAL LOW (ref 6.5–8.1)

## 2019-03-04 LAB — GLUCOSE, CAPILLARY
Glucose-Capillary: 124 mg/dL — ABNORMAL HIGH (ref 70–99)
Glucose-Capillary: 129 mg/dL — ABNORMAL HIGH (ref 70–99)
Glucose-Capillary: 152 mg/dL — ABNORMAL HIGH (ref 70–99)
Glucose-Capillary: 160 mg/dL — ABNORMAL HIGH (ref 70–99)
Glucose-Capillary: 90 mg/dL (ref 70–99)
Glucose-Capillary: 97 mg/dL (ref 70–99)
Glucose-Capillary: 162 mg/dL — ABNORMAL HIGH (ref 70–99)

## 2019-03-04 LAB — CBC
HCT: 22.7 % — ABNORMAL LOW (ref 39.0–52.0)
Hemoglobin: 8.3 g/dL — ABNORMAL LOW (ref 13.0–17.0)
MCH: 31.6 pg (ref 26.0–34.0)
MCHC: 36.6 g/dL — ABNORMAL HIGH (ref 30.0–36.0)
MCV: 86.3 fL (ref 80.0–100.0)
Platelets: 26 10*3/uL — CL (ref 150–400)
RBC: 2.63 MIL/uL — ABNORMAL LOW (ref 4.22–5.81)
RDW: 13.9 % (ref 11.5–15.5)
WBC: 6.1 10*3/uL (ref 4.0–10.5)
nRBC: 0 % (ref 0.0–0.2)

## 2019-03-04 LAB — AMMONIA: Ammonia: 25 umol/L (ref 9–35)

## 2019-03-04 LAB — TROPONIN I (HIGH SENSITIVITY): Troponin I (High Sensitivity): 3211 ng/L (ref <18)

## 2019-03-04 LAB — APTT: aPTT: 28 seconds (ref 24–36)

## 2019-03-04 LAB — MAGNESIUM: Magnesium: 2 mg/dL (ref 1.7–2.4)

## 2019-03-04 MED ORDER — LORAZEPAM 2 MG/ML IJ SOLN
0.5000 mg | INTRAMUSCULAR | Status: DC | PRN
  Administered 2019-03-04 – 2019-03-05 (×2): 1 mg via INTRAVENOUS
  Filled 2019-03-04: qty 1

## 2019-03-04 MED ORDER — LACTULOSE 10 GM/15ML PO SOLN
30.0000 g | Freq: Every day | ORAL | Status: DC
  Administered 2019-03-05: 30 g

## 2019-03-04 MED ORDER — PERFLUTREN LIPID MICROSPHERE
1.0000 mL | INTRAVENOUS | Status: AC | PRN
  Filled 2019-03-04: qty 10

## 2019-03-04 MED ORDER — HEPARIN SODIUM (PORCINE) 1000 UNIT/ML DIALYSIS
1000.0000 [IU] | INTRAMUSCULAR | Status: DC | PRN
  Administered 2019-03-04: 2400 [IU] via INTRAVENOUS_CENTRAL
  Filled 2019-03-04: qty 6
  Filled 2019-03-04: qty 3

## 2019-03-04 MED ORDER — INFLUENZA VAC SPLIT QUAD 0.5 ML IM SUSY
0.5000 mL | PREFILLED_SYRINGE | INTRAMUSCULAR | Status: DC
  Filled 2019-03-04: qty 0.5

## 2019-03-04 MED ORDER — SODIUM PHOSPHATES 45 MMOLE/15ML IV SOLN
25.0000 mmol | Freq: Once | INTRAVENOUS | Status: AC
  Administered 2019-03-04: 25 mmol via INTRAVENOUS
  Filled 2019-03-04: qty 8.33

## 2019-03-04 MED ORDER — DEXMEDETOMIDINE HCL IN NACL 400 MCG/100ML IV SOLN
0.1000 ug/kg/h | INTRAVENOUS | Status: DC
  Administered 2019-03-04: 0.6 ug/kg/h via INTRAVENOUS
  Administered 2019-03-05: 0.8 ug/kg/h via INTRAVENOUS
  Filled 2019-03-04 (×3): qty 100

## 2019-03-04 NOTE — Procedures (Signed)
Extubation Procedure Note  Patient Details:   Name: Shawn Watkins DOB: 1980-03-31 MRN: [id number]   Airway Documentation:    Vent end date: 03/04/19 Vent end time: 1138   Evaluation  O2 sats: stable throughout Complications: No apparent complications Patient did tolerate procedure well. Bilateral Breath Sounds: Diminished, Rhonchi   Yes   Pt extubated per physician order. Pt with positive cuff leak, suctioned orally and via ETT prior. Pt extubated to 4L Nasal cannula. Pt able to speak name and no stridor heard at this time. RT will continue to monitor.   Trilby Leaver Brianah Hopson 03/04/2019, 2:22 PM

## 2019-03-04 NOTE — Progress Notes (Signed)
eLink Physician-Brief Progress Note Patient Name: Shawn Watkins DOB: 1980-04-20 MRN: [id number]   Date of Service  03/04/2019  HPI/Events of Note  Agitation - Extubated today. Patient has a history of substance abuse.   eICU Interventions  Will order: 1. Precedex IV infusion. Titrate to RASS = 0.      Intervention Category Major Interventions: Delirium, psychosis, severe agitation - evaluation and management  Carry Ortez Eugene 03/04/2019, 11:19 PM

## 2019-03-04 NOTE — Progress Notes (Signed)
NAME:  Shawn Watkins, MRN:  [id number], DOB:  [birth year], LOS: 6 ADMISSION DATE:  02/26/2019, CONSULTATION DATE:  1/11 REFERRING MD:  Kerry Hough, CHIEF COMPLAINT:  Dyspnea, DKA, COVID   Brief History   39 y/o male seen 1/3 with nausea, vomiting, sore throat and body aches.  Diagnosed with COVID, then left for home AMA.  Returned 1/11 to [facility], in DKA, intubated for delirium and transferred to [facility].  Course complicated by AKI and persistent hypotension  Past Medical History  DM I ETOH Abuse - prior alcoholic hepatitis, DT's with ETOH withdrawal seizures, pancreatitis  Depression   Significant Hospital Events   1/03 Seen in [facility] ER, dx with COVID, left AMA  1/11 Admitted with DKA, AMS, intubated.  Tx to [facility] from [facility] 1/11 hypotensive on 3 pressors 1/15 start CRRT 1/16 Bradycardia overnight on Precedex, changed to Versed Dopamine added but resulted in bigeminy, held  Consults:  TRH  PCCM   Procedures:  ETT 1/11 >> 1/17 R Fem TLC 1/11 >> 1/15 Right IJ HD catheter 1/15 >>   Significant Diagnostic Tests:  UDS 1/11 (post arrival to [facility]) >> pos BDZ ECHO 1/11 >> nml LV fn 1/12 CT abdomen/pelvis without contrast >> diffuse small bowel thickening, thickening of transverse colon?  Shock bowel, hepatic steatosis  Micro Data:  COVID 1/03 >> positive  BCx2 1/11 >> ng UA 1/11 >> negative, + glucose UC 1/11 >> ng  Antimicrobials:  Vanco 1/11 >> 1/13  Cefepime 1/11 >>    Interim history/subjective:   Remains critically ill, intubated Hypothermic on CRRT, on warmer sedated on Versed drip at 5   Objective   Blood pressure 113/82, pulse (!) 102, temperature (!) 93.2 F (34 C), temperature source Esophageal, resp. rate (!) 36, height 6\' 1"  (1.854 m), weight 98.3 kg, SpO2 99 %. CVP:  [0 mmHg-14 mmHg] 14 mmHg  Vent Mode: CPAP;PSV FiO2 (%):  [40 %] 40 % Set Rate:  [26 bmp] 26 bmp Vt Set:  [550 mL] 550 mL PEEP:  [5 cmH20] 5 cmH20 Pressure Support:  [12 cmH20] 12 cmH20 Plateau Pressure:   [17 cmH20-38 cmH20] 17 cmH20   Intake/Output Summary (Last 24 hours) at 03/04/2019 1235 Last data filed at 03/04/2019 1100 Gross per 24 hour  Intake 624.46 ml  Output 7348 ml  Net -6723.54 ml   Filed Weights   03/02/19 0447 03/03/19 0500 03/04/19 0500  Weight: 98.6 kg 98.6 kg 98.3 kg    Examination:  General: Chronically ill-appearing man, ventilated, on CVVH HENT: ET tube in place, mild pallor, no icterus PULM: Coarse bilaterally, no wheezing CV: Regular, tachycardic, distant, no murmur GI: Soft, mild distention, nontender MSK: No deformities Neuro: Eyes open, tracks, nods to questions, intermittent agitation  Chest x-ray 1/15 personally reviewed shows bibasilar atelectasis/effusions  Labs show improving creatinine on CRRT, normal electrolytes, decreasing troponin, stable anemia and severe thrombocytopenia  Resolved Hospital Problem list   Bradycardia, likely due to Precedex  Assessment & Plan:  DKA Type 1 DM Lantus 5 units daily SSI  Follow CBG   Shock, lactic acidosis> both improved.  Echocardiogram 1/11 nml LV fn Off pressors No evidence overt bowel ischemia, tolerating tube feeding  Stress non-STEMI Supportive care, peak Tp 5k  Acute respriatory failure due to delirium Oxygenation and ventilatory mechanics improved  -He passed spontaneous breathing trials, tachypneic may be related to anxiety, proceed with extubation  AKI> now making urine Rhabdomyolysis Perhaps stop CRRT and see if he has renal recovery  Acute encephalopathy: toxic  metabolic encephalopathy Alcohol abuse> father said he relapsed 01/2019 Hepatic encephalopathy Decrease lactulose to once daily Dc Versed drip, use Ativan as needed per CIWA protocol Dc clonazepam, Seroquel   COVID 19 Dexamethasone x 10 days Remdesivir not given due to elevated LFTs, now 2 weeks out so doubt he needs this  Large pressure wound on buttocks Appreciate wound care assistance  Alcoholic hepatitis,  acute Hepatic steatosis> alcohol related most likely Following LFTs  Severe Thrombocytopenia :?  Etiology, alcohol-related -Follow   Summary-tolerating extubation, showing signs of renal recovery which is encouraging, thrombocytopenia persists  Best practice:  Diet: resume tube feeding Pain/Anxiety/Delirium protocol (if indicated): RASS goal -1 VAP protocol (if indicated): yes DVT prophylaxis: SCD GI prophylaxis: continue pantoprazole Glucose control: SSI Mobility: bed rest Code Status: Full Family Communication:  father 1/16 Disposition: ICU  The patient is critically ill with multiple organ systems failure and requires high complexity decision making for assessment and support, frequent evaluation and titration of therapies, application of advanced monitoring technologies and extensive interpretation of multiple databases. Critical Care Time devoted to patient care services described in this note independent of APP/resident  time is 35 minutes.    Kara Mead MD. [facility] Flood.  Pulmonary & Critical care  If no response to pager , please call [phone]   03/04/2019

## 2019-03-04 NOTE — Progress Notes (Signed)
  Echocardiogram 2D Echocardiogram has been performed.  Shawn Watkins 03/04/2019, 1:52 PM

## 2019-03-04 NOTE — Progress Notes (Signed)
[facility] Progress Note  Subjective: seen in ICU, extubated, ^'d UOP yest up to 1225.  Net neg 7L yest .   Vitals:   03/04/19 1300 03/04/19 1315 03/04/19 1330 03/04/19 1345  BP: 106/77 106/86 110/81 107/87  Pulse: (!) 103 (!) 105 (!) 106 (!) 104  Resp: (!) 35  (!) 40   Temp: (!) 93.5 F (34.2 C)   98.7 F (37.1 C)  TempSrc: Oral   Oral  SpO2: 96% 95% 96% 96%  Weight:      Height:        Exam:  temp cath for crrt    Sleeping, off the vent    Chest clear ant/ lat    Cor reg     Abd no ascites    Ext diffuse 1++ edema     Home meds:  - insulin glargine 15u hs/ insulin lispro 5 tid sq   Assessment/ Plan: 1. AKI - in setting of DKA and COVID infection. Shock is likely cause of ATN, oliguric. CPK was up slightly 1750 but not responsible for AKI. CRRT started on 1/15.  - off pressors, waking up and extubated - UOP 1200 yesterday > will dc CRRT and see if renal fxn is recovering  2. COVID + infection - per CCM/ triad 3. Thrombocytopenia - still low, smear neg for schistocytes. Suspected DIC.  4. Shock - improving, pressors off 5. DKA - per CCM 6. Alcoholic hepatitis 7. Vol excess - improving     Rob Chauntae Hults 03/04/2019, 3:12 PM  Inpatient medications: . chlorhexidine gluconate (MEDLINE KIT)  15 mL Mouth Rinse BID  . Chlorhexidine Gluconate Cloth  6 each Topical Daily  . folic acid  1 mg Per Tube Daily  . insulin aspart  0-9 Units Subcutaneous Q4H  . insulin glargine  5 Units Subcutaneous Daily  . [START ON 03/05/2019] lactulose  30 g Per Tube Daily  . mouth rinse  15 mL Mouth Rinse 10 times per day  . thiamine  100 mg Per Tube Daily   .  prismasol BGK 4/2.5 400 mL/hr at 03/04/19 1355  .  prismasol BGK 4/2.5 200 mL/hr at 03/04/19 1130  . norepinephrine (LEVOPHED) Adult infusion Stopped (03/03/19 1335)  . prismasol BGK 4/2.5 2,200 mL/hr at 03/04/19 1215   acetaminophen, alteplase, dextrose, docusate, fentaNYL (SUBLIMAZE) injection, heparin,  LORazepam, ondansetron (ZOFRAN) IV, sodium chloride

## 2019-03-05 ENCOUNTER — Inpatient Hospital Stay (HOSPITAL_COMMUNITY): Payer: HRSA Program

## 2019-03-05 LAB — PREPARE PLATELET PHERESIS: Unit division: 0

## 2019-03-05 LAB — CBC
HCT: 21.8 % — ABNORMAL LOW (ref 39.0–52.0)
Hemoglobin: 7.7 g/dL — ABNORMAL LOW (ref 13.0–17.0)
MCH: 29.8 pg (ref 26.0–34.0)
MCHC: 35.3 g/dL (ref 30.0–36.0)
MCV: 84.5 fL (ref 80.0–100.0)
Platelets: 65 10*3/uL — ABNORMAL LOW (ref 150–400)
RBC: 2.58 MIL/uL — ABNORMAL LOW (ref 4.22–5.81)
RDW: 14.4 % (ref 11.5–15.5)
WBC: 9.5 10*3/uL (ref 4.0–10.5)
nRBC: 0 % (ref 0.0–0.2)

## 2019-03-05 LAB — HEPATIC FUNCTION PANEL
ALT: 109 U/L — ABNORMAL HIGH (ref 0–44)
AST: 101 U/L — ABNORMAL HIGH (ref 15–41)
Albumin: 2 g/dL — ABNORMAL LOW (ref 3.5–5.0)
Alkaline Phosphatase: 279 U/L — ABNORMAL HIGH (ref 38–126)
Bilirubin, Direct: 0.2 mg/dL (ref 0.0–0.2)
Indirect Bilirubin: 1.1 mg/dL — ABNORMAL HIGH (ref 0.3–0.9)
Total Bilirubin: 1.3 mg/dL — ABNORMAL HIGH (ref 0.3–1.2)
Total Protein: 4.7 g/dL — ABNORMAL LOW (ref 6.5–8.1)

## 2019-03-05 LAB — RENAL FUNCTION PANEL
Albumin: 1.9 g/dL — ABNORMAL LOW (ref 3.5–5.0)
Anion gap: 8 (ref 5–15)
BUN: 50 mg/dL — ABNORMAL HIGH (ref 6–20)
CO2: 25 mmol/L (ref 22–32)
Calcium: 7 mg/dL — ABNORMAL LOW (ref 8.9–10.3)
Chloride: 107 mmol/L (ref 98–111)
Creatinine, Ser: 3.53 mg/dL — ABNORMAL HIGH (ref 0.61–1.24)
GFR calc Af Amer: 24 mL/min — ABNORMAL LOW (ref >60)
GFR calc non Af Amer: 21 mL/min — ABNORMAL LOW (ref >60)
Glucose, Bld: 148 mg/dL — ABNORMAL HIGH (ref 70–99)
Phosphorus: 3.5 mg/dL (ref 2.5–4.6)
Potassium: 4 mmol/L (ref 3.5–5.1)
Sodium: 140 mmol/L (ref 135–145)

## 2019-03-05 LAB — GLUCOSE, CAPILLARY
Glucose-Capillary: 140 mg/dL — ABNORMAL HIGH (ref 70–99)
Glucose-Capillary: 127 mg/dL — ABNORMAL HIGH (ref 70–99)
Glucose-Capillary: 182 mg/dL — ABNORMAL HIGH (ref 70–99)
Glucose-Capillary: 130 mg/dL — ABNORMAL HIGH (ref 70–99)
Glucose-Capillary: 186 mg/dL — ABNORMAL HIGH (ref 70–99)
Glucose-Capillary: 137 mg/dL — ABNORMAL HIGH (ref 70–99)

## 2019-03-05 LAB — BPAM PLATELET PHERESIS
Blood Product Expiration Date: 202101172359
ISSUE DATE / TIME: 202101150919
Unit Type and Rh: 6200

## 2019-03-05 MED ORDER — CHLORDIAZEPOXIDE HCL 25 MG PO CAPS
25.0000 mg | ORAL_CAPSULE | Freq: Once | ORAL | Status: AC
  Administered 2019-03-05: 13:00:00 25 mg via ORAL
  Filled 2019-03-05: qty 1

## 2019-03-05 MED ORDER — CHLORDIAZEPOXIDE HCL 25 MG PO CAPS
25.0000 mg | ORAL_CAPSULE | Freq: Four times a day (QID) | ORAL | Status: AC
  Administered 2019-03-05 – 2019-03-06 (×4): 25 mg via ORAL
  Filled 2019-03-05 (×3): qty 1

## 2019-03-05 MED ORDER — ONDANSETRON 4 MG PO TBDP
4.0000 mg | ORAL_TABLET | Freq: Four times a day (QID) | ORAL | Status: AC | PRN
  Filled 2019-03-05: qty 1

## 2019-03-05 MED ORDER — CHLORDIAZEPOXIDE HCL 5 MG PO CAPS
25.0000 mg | ORAL_CAPSULE | Freq: Every day | ORAL | Status: AC
  Administered 2019-03-08: 25 mg via ORAL
  Filled 2019-03-05: qty 5

## 2019-03-05 MED ORDER — CHLORDIAZEPOXIDE HCL 25 MG PO CAPS
25.0000 mg | ORAL_CAPSULE | Freq: Three times a day (TID) | ORAL | Status: AC
  Administered 2019-03-06 – 2019-03-07 (×2): 25 mg via ORAL
  Filled 2019-03-05 (×2): qty 1

## 2019-03-05 MED ORDER — CHLORDIAZEPOXIDE HCL 25 MG PO CAPS
25.0000 mg | ORAL_CAPSULE | ORAL | Status: AC
  Administered 2019-03-07 – 2019-03-08 (×2): 25 mg via ORAL
  Filled 2019-03-05 (×2): qty 1

## 2019-03-05 MED ORDER — LOPERAMIDE HCL 2 MG PO CAPS
2.0000 mg | ORAL_CAPSULE | ORAL | Status: AC | PRN
  Filled 2019-03-05: qty 2

## 2019-03-05 MED ORDER — MAGIC MOUTHWASH W/LIDOCAINE
5.0000 mL | Freq: Three times a day (TID) | ORAL | Status: DC | PRN
  Administered 2019-03-05 – 2019-03-07 (×3): 5 mL via ORAL
  Filled 2019-03-05 (×5): qty 5

## 2019-03-05 MED ORDER — HYDROXYZINE HCL 25 MG PO TABS
25.0000 mg | ORAL_TABLET | Freq: Four times a day (QID) | ORAL | Status: AC | PRN
  Filled 2019-03-05 (×2): qty 1

## 2019-03-05 MED ORDER — SODIUM CHLORIDE 0.9 % IV SOLN: INTRAVENOUS | Status: DC

## 2019-03-05 MED ORDER — CHLORDIAZEPOXIDE HCL 25 MG PO CAPS
25.0000 mg | ORAL_CAPSULE | Freq: Four times a day (QID) | ORAL | Status: AC | PRN
  Filled 2019-03-05 (×2): qty 1

## 2019-03-05 NOTE — Progress Notes (Signed)
[name] Progress Note   Home meds: - insulin glargine 15u hs/ insulin lispro 5 tid sq  Assessment/ Plan:   1. AKI - in setting of DKA and COVID infection. Shock is likely cause of ATN, oliguric. CPK was up slightly 1750 but not responsible for AKI. CRRT started on 1/15.  - off pressors, waking up and extubated - UOP 1200/800 past 48hrs > will hold off on restarting RRT and see if renal fxn will start  Recovering. No acute indication for RRT.  2. COVID + infection - per CCM/ triad 3. Thrombocytopenia - still low but improving, smear neg for schistocytes. Suspected DIC.  4. Shock - improving, pressors off 5. DKA - per CCM 6. Alcoholic hepatitis 7. Vol excess - improving 8. Anemia - will give 1 dose of ESA Aranesp 60  Subjective:   Complaining of generalized body aches and sore throat.   Objective:   BP 108/73 (BP Location: Right Arm)   Pulse 70   Temp 99.1 F (37.3 C) (Axillary)   Resp (!) 36   Ht '6\' 1"'$  (1.854 m)   Wt 97.9 kg   SpO2 91%   BMI 28.48 kg/m   Intake/Output Summary (Last 24 hours) at 03/05/2019 1258 Last data filed at 03/05/2019 1000 Gross per 24 hour  Intake 683.74 ml  Output 746 ml  Net -62.26 ml   Weight change: -0.4 kg  Physical Exam: temp cath for crrt    Awake, off the vent    Chest clear ant/ lat    Cor reg     Abd no ascites    Ext diffuse 1++ edema   Imaging: ECHOCARDIOGRAM LIMITED  Result Date: 03/04/2019   ECHOCARDIOGRAM LIMITED REPORT   Patient Name:   Shawn Watkins Date of Exam: 03/04/2019 Medical Rec #:  [id number]       Height:       73.0 in Accession #:    [id number]      Weight:       216.7 lb Date of Birth:  [birth year]       BSA:          2.23 m Patient Age:    39 years        BP:           113/82 mmHg Patient Gender: M               HR:           108 bpm. Exam Location:  Inpatient  Procedure: Limited Echo, Limited Color Doppler and Cardiac Doppler Indications:    elevated troponin  History:        Patient has  prior history of Echocardiogram examinations, most                 recent 02/26/2019.  Sonographer:    Johny Chess Referring Phys: [place]  1. Left ventricular ejection fraction, by visual estimation, is 60 to 65%. The left ventricle has normal function. There is no increased left ventricular wall thickness.  2. Global right ventricle has normal systolc function.The right ventricular size is normal. no increase in right ventricular wall thickness.  3. The mitral valve is normal in structure. Trivial mitral valve regurgitation. No evidence of mitral stenosis.  4. The tricuspid valve was normal in structure. Tricuspid valve regurgitation is mild.  5. Tricuspid valve regurgitation is mild.  6. No evidence of aortic valve sclerosis or stenosis.  7. The inferior vena cava  is normal in size with greater than 50% respiratory variability, suggesting right atrial pressure of 3 mmHg. FINDINGS  Left Ventricle: Left ventricular ejection fraction, by visual estimation, is 60 to 65%. The left ventricle has normal function. There is no increased left ventricular wall thickness. Left ventricular diastolic parameters were normal. Normal left atrial pressure. Right Ventricle: The right ventricular size is normal. No increase in right ventricular wall thickness. Global RV systolic function is has normal systolic function. Left Atrium: Left atrial size was normal in size. Right Atrium: Right atrial size was normal in size. Right atrial pressure is estimated at 3 mmHg. Pericardium: There is no evidence of pericardial effusion is seen. There is no evidence of pericardial effusion. Mitral Valve: The mitral valve is normal in structure. There is mild thickening of the mitral valve leaflet(s). No evidence of mitral valve stenosis by observation. Trivial mitral valve regurgitation. Tricuspid Valve: The tricuspid valve is normal in structure. Tricuspid valve regurgitation is mild. Aortic Valve: The aortic valve  is normal in structure. Aortic valve regurgitation is not visualized. The aortic valve is structurally normal, with no evidence of sclerosis or stenosis. Pulmonic Valve: The pulmonic valve was not well visualized. Pulmonic valve regurgitation is trivial by color flow Doppler. Pulmonic regurgitation is trivial by color flow Doppler. Aorta: The aortic root, ascending aorta and aortic arch are all structurally normal, with no evidence of dilitation or obstruction. Venous: The inferior vena cava is normal in size with greater than 50% respiratory variability, suggesting right atrial pressure of 3 mmHg. Shunts: There is no evidence of a patent foramen ovale. No ventricular septal defect is seen or detected. There is no evidence of an atrial septal defect. No atrial level shunt detected by color flow Doppler.  LEFT VENTRICLE          Normals PLAX 2D LVIDd:         4.88 cm  3.6 cm LVIDs:         3.63 cm  1.7 cm LV PW:         1.00 cm  1.4 cm LV IVS:        1.00 cm  1.3 cm LVOT diam:     2.30 cm  2.0 cm LV SV:         56 ml    79 ml LV SV Index:   24.79    45 ml/m2 LVOT Area:     4.15 cm 3.14 cm2  LEFT ATRIUM         Index LA diam:    3.70 cm 1.66 cm/m  AORTIC VALVE             Normals LVOT Vmax:   96.10 cm/s LVOT Vmean:  64.700 cm/s 75 cm/s LVOT VTI:    0.182 m     25.3 cm  AORTA                 Normals Ao Root diam: 3.30 cm 31 mm  SHUNTS Systemic VTI:  0.18 m Systemic Diam: 2.30 cm  Jenkins Rouge MD Electronically signed by Jenkins Rouge MD Signature Date/Time: 03/04/2019/3:15:53 PMThe mitral valve is normal in structure.    Final     Labs: BMET Recent Labs  Lab 03/01/19 0401 03/01/19 0401 03/01/19 0533 03/02/19 0400 03/02/19 2202 03/03/19 0515 03/03/19 1615 03/04/19 0525 03/05/19 0520  NA 140   < > 140 141 140 140 140 139 140  K 4.2   < > 4.2 4.5 4.4 4.3 3.8 4.0  4.0  CL 102  --   --  106 104 105 106 104 107  CO2 24  --   --  20* 21* '25 27 27 25  '$ GLUCOSE 170*  --   --  156* 238* 224* 163* 121* 148*   BUN 76*  --   --  94* 88* 83* 69* 50* 50*  CREATININE 6.36*  --   --  6.70* 5.54* 4.88* 4.02* 3.17* 3.53*  CALCIUM 5.9*  --   --  6.4* 7.1* 7.1* 7.1* 7.1* 7.0*  PHOS  --   --   --   --   --  2.0* 1.3* 1.5* 3.5   < > = values in this interval not displayed.   CBC Recent Labs  Lab 02/28/19 0546 02/28/19 0546 03/01/19 0401 03/01/19 0533 03/02/19 0400 03/03/19 0515 03/04/19 0525 03/05/19 0520  WBC 28.6*   < > 16.1*   < > 9.0 7.9 6.1 9.5  NEUTROABS 27.2*  --  14.8*  --  7.2 5.5  --   --   HGB 11.0*   < > 9.9*   < > 9.8* 8.5* 8.3* 7.7*  HCT 30.5*   < > 27.2*   < > 26.4* 24.1* 22.7* 21.8*  MCV 82.2   < > 82.4   < > 84.9 83.4 86.3 84.5  PLT 32*   < > 18*   < > 17* 28* 26* 65*   < > = values in this interval not displayed.    Medications:    . chlordiazePOXIDE  25 mg Oral QID   Followed by  . [START ON 03/06/2019] chlordiazePOXIDE  25 mg Oral TID   Followed by  . [START ON 03/07/2019] chlordiazePOXIDE  25 mg Oral BH-qamhs   Followed by  . [START ON 03/08/2019] chlordiazePOXIDE  25 mg Oral Daily  . chlorhexidine gluconate (MEDLINE KIT)  15 mL Mouth Rinse BID  . Chlorhexidine Gluconate Cloth  6 each Topical Daily  . folic acid  1 mg Per Tube Daily  . influenza vac split quadrivalent PF  0.5 mL Intramuscular Tomorrow-1000  . insulin aspart  0-9 Units Subcutaneous Q4H  . insulin glargine  5 Units Subcutaneous Daily  . lactulose  30 g Per Tube Daily  . mouth rinse  15 mL Mouth Rinse 10 times per day  . thiamine  100 mg Per Tube Daily      Otelia Santee, MD 03/05/2019, 12:58 PM

## 2019-03-05 NOTE — Evaluation (Signed)
Physical Therapy Evaluation Patient Details Name: Shawn Watkins MRN: [id number] DOB: 11-May-1980 Today's Date: 03/05/2019   History of Present Illness  39 y.o. male admitted on 02/26/19 for for DKA, ETOH 215, COVID-19.  Was dx 02/18/19 with COVID, however, left [facility] ED AMA.  He was intubated at [facility] on 02/26/19 and transferred to [facility] for further care.  He was started on CRRT on 03/02/19, hypotensive and bradycardic.  Pt extubated 03/03/18. Pt with significant PMH of alcoholism (per father 10 years), DM2, depression, alcoholic hepatitis.    Clinical Impression  Pt is very weak, deconditioned.  He was able to sit EOB with me for 8 mins VSS on 3L O2 HFNC, BP stable HR stable.  He was unable to keep his saliva in his mouth in sitting, and reported generalized achy-ness.  He needed orientation to place, time, and situation, however, he was pleasant and cooperative during my session.  He would benefit from multidisciplinary rehabilitation before either going home with his parents or going to live-in alcohol rehab program.  I spoke with his dad, Ehan Sr. Over the phone to discuss d/c plans. I have asked for CIR to screen him for appropriateness for their services.   PT to follow acutely for deficits listed below.      Follow Up Recommendations CIR    Equipment Recommendations  3in1 (PT);Rolling walker with 5" wheels    Recommendations for Other Services Rehab consult     Precautions / Restrictions Precautions Precautions: Fall;Other (comment)(monitor vitals closely)      Mobility  Bed Mobility Overal bed mobility: Needs Assistance Bed Mobility: Supine to Sit;Sit to Supine     Supine to sit: Mod assist;HOB elevated Sit to supine: Mod assist;HOB elevated   General bed mobility comments: Mod assist to help progress LEs over EOB and then separately support trunk to come to sitting.  Cues for sequencing and hand placement.   Transfers                 General transfer comment: NT, pt too  weak and would require two person assist.  Just sat EOB today.          Balance Overall balance assessment: Needs assistance Sitting-balance support: Feet supported;Bilateral upper extremity supported Sitting balance-Leahy Scale: Poor Sitting balance - Comments: mod assist EOB with bil UE support.  R lateral lean. Sat EOB for ~ 8 mins before fatigued. VSS on 3 L O2 HFNC Postural control: Right lateral lean                                   Pertinent Vitals/Pain Pain Assessment: Faces Faces Pain Scale: Hurts little more Pain Location: generalized body aches/sore Pain Descriptors / Indicators: Aching;Sore Pain Intervention(s): Limited activity within patient's tolerance;Monitored during session;Repositioned    Home Living Family/patient expects to be discharged to:: Private residence Living Arrangements: Parent(father and step mother with transition to ETOH rehab center) Available Help at Discharge: Family;Available 24 hours/day("we will make it happen" per dad) Type of Home: House Home Access: Stairs to enter Entrance Stairs-Rails: Right Entrance Stairs-Number of Steps: 2 Home Layout: Two level;Able to live on main level with bedroom/bathroom Home Equipment: None Additional Comments: 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 He has been struggling with sobriety for 10 years now.  Birth mother, Marylu Lund lives in [place] and is  bipolar (not a great relationship there).     Prior Function Level of Independence: Independent         Comments: Pt was in [place] for 100 days for ETOH rehab and just returned before Christmas, was staying with his dad for 1 week and then moved in with his brother in [place].  Was there two days before he broke his sobriety. His father said he must have walked for miles to get to the [employer] store as he does not have a license.       Hand Dominance   Dominant  Hand: Right    Extremity/Trunk Assessment   Upper Extremity Assessment Upper Extremity Assessment: Generalized weakness    Lower Extremity Assessment Lower Extremity Assessment: RLE deficits/detail;LLE deficits/detail RLE Deficits / Details: Pt is generally weak and edematous in bil LEs R>L, unable to lift fully against gravity 3-/5 bil but equal.  LLE Deficits / Details: Pt is generally weak and edematous in bil LEs R>L, unable to lift fully against gravity 3-/5 bil but equal.     Cervical / Trunk Assessment Cervical / Trunk Assessment: Other exceptions Cervical / Trunk Exceptions: weak trunk in sitting EOB, unable to hold himself up without support. Rounded shoulders and pelvis  Communication   Communication: Other (comment)(low tone, hushed voice)  Cognition Arousal/Alertness: Awake/alert Behavior During Therapy: WFL for tasks assessed/performed Overall Cognitive Status: Impaired/Different from baseline Area of Impairment: Orientation;Attention;Memory;Following commands;Safety/judgement;Awareness;Problem solving                 Orientation Level: Disoriented to;Place;Time;Situation Current Attention Level: Sustained Memory: Decreased short-term memory Following Commands: Follows one step commands with increased time Safety/Judgement: Decreased awareness of deficits Awareness: Emergent Problem Solving: Slow processing;Decreased initiation;Difficulty sequencing;Requires verbal cues;Requires tactile cues General Comments: Pt calm, cooperative, slow to process commands, low tone voice and vocal quality, does not remember much about his admission or circumstances around his admission.  Tells me he lives with his brother, Gerald Stabs which is confirmed by his father.               Assessment/Plan    PT Assessment Patient needs continued PT services  PT Problem List Decreased strength;Decreased activity tolerance;Decreased mobility;Decreased balance;Decreased cognition;Decreased  knowledge of use of DME;Decreased safety awareness;Decreased knowledge of precautions;Cardiopulmonary status limiting activity       PT Treatment Interventions DME instruction;Gait training;Stair training;Functional mobility training;Therapeutic activities;Therapeutic exercise;Balance training;Neuromuscular re-education;Cognitive remediation;Patient/family education    PT Goals (Current goals can be found in the Care Plan section)  Acute Rehab PT Goals Patient Stated Goal: to get better PT Goal Formulation: With patient/family Time For Goal Achievement: 03/19/19 Potential to Achieve Goals: Good    Frequency Min 3X/week           AM-PAC PT "6 Clicks" Mobility  Outcome Measure Help needed turning from your back to your side while in a flat bed without using bedrails?: A Lot Help needed moving from lying on your back to sitting on the side of a flat bed without using bedrails?: A Lot Help needed moving to and from a bed to a chair (including a wheelchair)?: A Lot Help needed standing up from a chair using your arms (e.g., wheelchair or bedside chair)?: A Lot Help needed to walk in hospital room?: Total Help needed climbing 3-5 steps with a railing? : Total 6 Click Score: 10    End of Session Equipment Utilized During Treatment: Oxygen Activity Tolerance: Patient limited by fatigue Patient left: in bed;with call bell/phone within reach Nurse  Communication: Mobility status PT Visit Diagnosis: Muscle weakness (generalized) (M62.81);Difficulty in walking, not elsewhere classified (R26.2)    Time: 3729-0211 PT Time Calculation (min) (ACUTE ONLY): 22 min   Charges:        Corinna Capra, PT, DPT  Acute Rehabilitation [phone] pager #[phone] office  @ [facility]: [phone]   PT Evaluation $PT Eval Moderate Complexity: 1 Mod         03/05/2019, 6:02 PM

## 2019-03-05 NOTE — Progress Notes (Signed)
eLink Physician-Brief Progress Note Patient Name: Shawn Watkins DOB: Oct 16, 1980 MRN: [id number]   Date of Service  03/05/2019  HPI/Events of Note  Nursing concerns: 1. Extubated - Tube feeds stopped. Poor PO intake and 2. Painful tongue and lip lesions.   eICU Interventions  Will order: 1. 0.9 NaCl to run IV at 75 mL/hour.  2. Magic mouthwash with lidocaine TID.     Intervention Category Major Interventions: Other:  [name] 03/05/2019, 8:44 PM

## 2019-03-05 NOTE — Progress Notes (Signed)
NAME:  Shawn Watkins, MRN:  [id number], DOB:  1980-08-12, LOS: 7 ADMISSION DATE:  02/26/2019, CONSULTATION DATE:  1/11 REFERRING MD:  Roderic Palau, CHIEF COMPLAINT:  Dyspnea, DKA, COVID   Brief History   39 y/o male seen 1/3 with nausea, vomiting, sore throat and body aches.  Diagnosed with COVID, then left for home AMA.  Returned 1/11 to [facility], in DKA, intubated for delirium and transferred to [facility].  Course complicated by AKI and persistent hypotension  Past Medical History  DM I ETOH Abuse - prior alcoholic hepatitis, DT's with ETOH withdrawal seizures, pancreatitis  Depression   Significant Hospital Events   1/03 Seen in [facility] ER, dx with COVID, left AMA  1/11 Admitted with DKA, AMS, intubated.  Tx to [facility] from [facility] 1/11 hypotensive on 3 pressors 1/15 start CRRT 1/16 Bradycardia overnight on Precedex, changed to Versed Dopamine added but resulted in bigeminy, held 1/17: Extubated 1/18: Weaning Precedex, adding Librium taper, Foley catheter replaced due to requiring frequent in and out catheterization Consults:  TRH  PCCM   Procedures:  ETT 1/11 >> 1/17 R Fem TLC 1/11 >> 1/15 Right IJ HD catheter 1/15 >>   Significant Diagnostic Tests:  UDS 1/11 (post arrival to [facility]) >> pos BDZ ECHO 1/11 >> nml LV fn 1/12 CT abdomen/pelvis without contrast >> diffuse small bowel thickening, thickening of transverse colon?  Shock bowel, hepatic steatosis  Micro Data:  COVID 1/03 >> positive  BCx2 1/11 >> ng UA 1/11 >> negative, + glucose UC 1/11 >> ng  Antimicrobials:  Vanco 1/11 >> 1/13  Cefepime 1/11 >>    Interim history/subjective:  Slow to respond on Precedex, apparently very agitated when weaned off   Objective   Blood pressure 91/62, pulse 75, temperature 99 F (37.2 C), temperature source Axillary, resp. rate (Abnormal) 24, height 6\' 1"  (1.854 m), weight 97.9 kg, SpO2 92 %.        Intake/Output Summary (Last 24 hours) at 03/05/2019 0915 Last data filed at 03/05/2019 0600 Gross  per 24 hour  Intake 636.28 ml  Output 1336 ml  Net -699.72 ml   Filed Weights   03/03/19 0500 03/04/19 0500 03/05/19 0500  Weight: 98.6 kg 98.3 kg 97.9 kg    Examination: General: 39 year old white male, sedated, remains encephalopathic, currently not in distress will try to verbalize some but not able to ascertain what he saying HEENT normocephalic mucous membranes are dry and cracked no JVD sclera nonicteric Pulmonary: Some scattered rhonchi equal chest rise no accessory use Oxygen 3 lpm Cardiac: Regular rate and rhythm Abdomen soft not tender no organomegaly Extremities are warm and dry with brisk capillary refill, he does have pitting edema in both ankles Neuro opens eyes, tries to communicate some.  Moves all extremities, not following commands currently GU minimal urine output, requiring in and out catheterizations  Resolved Hospital Problem list   Bradycardia, likely due to Precedex Diabetic ketoacidosis Lactic acidosis Septic shock Aspiration PNA (completed rx 1/16) Assessment & Plan:   Acute encephalopathy: toxic metabolic encephalopathy (2/2 DKA, renal failure, hepatic encephalopathy and withdrawal) Alcohol abuse> father said he relapsed 01/2019 Plan Weaning precedex to off Placed on librium taper Off clonazepam and seroquel  Cont thiamine and folate Cont lactulose   Acute hypoxic respiratory failure 2/2 COVID PNA and possible aspiration  Past CXR w/ right sided vol loss. Likely element of effusion and atx but can't exclude PNA -completed abx Plan Wean oxygen  Pulse ox Repeat cxr today  Aspiration precaution SLP  eval   Type 1 diabetes DKA resolved, glycemic control now acceptable Plan Continue sliding scale insulin and daily Lantus  AKI 2/2 Rhabdomyolysis;  Now off CRRT since 1/17 scr climbed some.  Plan Place foley Avoid dehydration Daily chemistry Strict I&O  Drug-related hypotension Suspect ongoing hypotension secondary to  Precedex Plan Continue to titrate norepinephrine for mean arterial pressure greater than 65 Weaning Precedex to off Continue telemetry monitoring  Stress non-STEMI.  Normal LV function, no wall motion abnormality  peak Tp 5k Plan Continue telemetry monitoring Add aspirin when taking pos  Consider beta-blockade at some point when blood pressure stabilized Will need cardiology evaluation after resolution of acute illness  COVID 19 Completed steroids Remdesivir not given due to elevated LFTs, now 2 weeks out Plan Cont resp isolation   Alcoholic hepatitis, acute Hepatic steatosis> alcohol related most likely Plan F/u LFTs  Severe Thrombocytopenia and anemia : thrombocytopenmia possibly alcohol-related?? ->improving. Anemia stable likely 2/2 critical illness Plan Follow   Large pressure wound on buttocks Plan Cont wound care   Best practice:  Diet: resume tube feeding Pain/Anxiety/Delirium protocol (if indicated): RASS goal -1 VAP protocol (if indicated): yes DVT prophylaxis: SCD GI prophylaxis: continue pantoprazole Glucose control: SSI Mobility: bed rest Code Status: Full Family Communication:  father 1/16 Disposition: ICU  My cct 34 minutes  Simonne Martinet ACNP-BC [facility] Pulmonary/Critical Care Pager # [phone] OR # [phone] if no answer   03/05/2019

## 2019-03-06 LAB — RENAL FUNCTION PANEL
Albumin: 2 g/dL — ABNORMAL LOW (ref 3.5–5.0)
Anion gap: 10 (ref 5–15)
BUN: 54 mg/dL — ABNORMAL HIGH (ref 6–20)
CO2: 24 mmol/L (ref 22–32)
Calcium: 7.3 mg/dL — ABNORMAL LOW (ref 8.9–10.3)
Chloride: 109 mmol/L (ref 98–111)
Creatinine, Ser: 4.63 mg/dL — ABNORMAL HIGH (ref 0.61–1.24)
GFR calc Af Amer: 17 mL/min — ABNORMAL LOW (ref >60)
GFR calc non Af Amer: 15 mL/min — ABNORMAL LOW (ref >60)
Glucose, Bld: 151 mg/dL — ABNORMAL HIGH (ref 70–99)
Phosphorus: 3.6 mg/dL (ref 2.5–4.6)
Potassium: 3.9 mmol/L (ref 3.5–5.1)
Sodium: 143 mmol/L (ref 135–145)

## 2019-03-06 LAB — HEPATIC FUNCTION PANEL
ALT: 87 U/L — ABNORMAL HIGH (ref 0–44)
AST: 57 U/L — ABNORMAL HIGH (ref 15–41)
Albumin: 2 g/dL — ABNORMAL LOW (ref 3.5–5.0)
Alkaline Phosphatase: 295 U/L — ABNORMAL HIGH (ref 38–126)
Bilirubin, Direct: 0.2 mg/dL (ref 0.0–0.2)
Indirect Bilirubin: 0.7 mg/dL (ref 0.3–0.9)
Total Bilirubin: 0.9 mg/dL (ref 0.3–1.2)
Total Protein: 5 g/dL — ABNORMAL LOW (ref 6.5–8.1)

## 2019-03-06 LAB — GLUCOSE, CAPILLARY
Glucose-Capillary: 134 mg/dL — ABNORMAL HIGH (ref 70–99)
Glucose-Capillary: 147 mg/dL — ABNORMAL HIGH (ref 70–99)
Glucose-Capillary: 155 mg/dL — ABNORMAL HIGH (ref 70–99)
Glucose-Capillary: 132 mg/dL — ABNORMAL HIGH (ref 70–99)

## 2019-03-06 MED ORDER — VALACYCLOVIR HCL 500 MG PO TABS
500.0000 mg | ORAL_TABLET | Freq: Two times a day (BID) | ORAL | Status: DC
  Administered 2019-03-06 – 2019-03-10 (×9): 500 mg via ORAL
  Filled 2019-03-06 (×10): qty 1

## 2019-03-06 MED ORDER — FOLIC ACID 1 MG PO TABS
1.0000 mg | ORAL_TABLET | Freq: Every day | ORAL | Status: DC
  Administered 2019-03-06 – 2019-03-13 (×8): 1 mg via ORAL
  Filled 2019-03-06 (×8): qty 1

## 2019-03-06 MED ORDER — LIP MEDEX EX OINT
TOPICAL_OINTMENT | CUTANEOUS | Status: DC | PRN
  Administered 2019-03-10: 1 via TOPICAL
  Filled 2019-03-06 (×2): qty 7

## 2019-03-06 MED ORDER — THIAMINE HCL 100 MG PO TABS
100.0000 mg | ORAL_TABLET | Freq: Every day | ORAL | Status: DC
  Administered 2019-03-06 – 2019-03-13 (×8): 100 mg via ORAL
  Filled 2019-03-06 (×7): qty 1

## 2019-03-06 MED ORDER — DARBEPOETIN ALFA 40 MCG/0.4ML IJ SOSY
40.0000 ug | PREFILLED_SYRINGE | INTRAMUSCULAR | Status: DC
  Administered 2019-03-06 – 2019-03-13 (×2): 40 ug via SUBCUTANEOUS
  Filled 2019-03-06 (×2): qty 0.4

## 2019-03-06 MED ORDER — LACTULOSE 10 GM/15ML PO SOLN
30.0000 g | Freq: Every day | ORAL | Status: DC
  Administered 2019-03-06 – 2019-03-11 (×4): 30 g via ORAL
  Filled 2019-03-06 (×6): qty 45

## 2019-03-06 NOTE — Progress Notes (Addendum)
Occupational Therapy Evaluation Patient Details Name: Shawn Watkins MRN: [id number] DOB: Feb 12, 1981 Today's Date: 03/06/2019    History of Present Illness 39 y.o. male admitted on 02/26/19 for for DKA, ETOH 215, COVID-19.  Was dx 02/18/19 with COVID, however, left [facility] ED AMA.  He was intubated at [facility] on 02/26/19 and transferred to [facility] for further care.  He was started on CRRT on 03/02/19, hypotensive and bradycardic.  Pt extubated 03/03/18. Pt with significant PMH of alcoholism (per father 10 years), DM2, depression, alcoholic hepatitis.     Clinical Impression   PTA, pt independent with ADL and mobility. Per father, pt recently discharged from alcohol rehab program in [place]. Pt mobilized to recliner with +2 mod A using Stedy. Completed oral care while OOB in chair. Pt asking to return to bed upon sitting in the chair due to discomfort @ his bottom. Pt sat in recliner @ 20-30 min before returning to bed. Pt restless and impulsive at times therefore transferred back to bed to reduce risk of falls. Less assistance required, and was able to stand with min A +2 with use of Stedy. Able to stand 3 min while nursing changed his bandage. Will try Geomatt cushion to help with pressure relief. VSS on RA. Recommend CIR for rehab. Will follow acutely.    Follow Up Recommendations  CIR;Supervision/Assistance - 24 hour    Equipment Recommendations  3 in 1 bedside commode    Recommendations for Other Services Rehab consult     Precautions / Restrictions Precautions Precautions: Fall Precaution Comments: pressure area buttocks      Mobility Bed Mobility Overal bed mobility: Needs Assistance       Supine to sit: Mod assist;HOB elevated     General bed mobility comments: Ableto scoot hips to EOB with min A  Transfers Overall transfer level: Needs assistance   Transfers: Sit to/from Stand Sit to Stand: +2 physical assistance;Mod assist;Min assist(Mod A first trial, then min A. Minguard from Stedy  flaps)              Balance Overall balance assessment: Needs assistance   Sitting balance-Leahy Scale: Fair Sitting balance - Comments: Able to sit unsupported for several minutes     Standing balance-Leahy Scale: Poor                             ADL either performed or assessed with clinical judgement   ADL Overall ADL's : Needs assistance/impaired Eating/Feeding: Minimal assistance Eating/Feeding Details (indicate cue type and reason): ice chips/water with supervision Grooming: Minimal assistance;Oral care;Wash/dry hands;Wash/dry face   Upper Body Bathing: Minimal assistance;Sitting   Lower Body Bathing: Moderate assistance;Sit to/from stand   Upper Body Dressing : Sitting;Moderate assistance   Lower Body Dressing: Maximal assistance;Sit to/from stand   Toilet Transfer: Moderate assistance;+2 for physical assistance(simulated)   Toileting- Clothing Manipulation and Hygiene: Maximal assistance       Functional mobility during ADLs: Moderate assistance;+2 for physical assistance General ADL Comments: Stedy used to progress to chair     Vision         Perception     Praxis      Pertinent Vitals/Pain Pain Assessment: Faces Faces Pain Scale: Hurts whole lot Pain Location: mouth Pain Descriptors / Indicators: Discomfort;Burning;Sore Pain Intervention(s): Limited activity within patient's tolerance;Other (comment)(Nsg aware)     Hand Dominance Right   Extremity/Trunk Assessment Upper Extremity Assessment Upper Extremity Assessment: Generalized weakness   Lower Extremity Assessment  Lower Extremity Assessment: Defer to PT evaluation   Cervical / Trunk Assessment Cervical / Trunk Assessment: Other exceptions(sore back)   Communication Communication Communication: (limited by sore mouth)   Cognition Arousal/Alertness: Awake/alert Behavior During Therapy: Impulsive;Restless(Mentioned that he was "tired of living this way") Overall  Cognitive Status: Impaired/Different from baseline Area of Impairment: Orientation;Attention;Memory;Safety/judgement;Following commands;Awareness;Problem solving                 Orientation Level: Disoriented to;Place;Time Current Attention Level: Sustained Memory: Decreased recall of precautions;Decreased short-term memory Following Commands: Follows one step commands with increased time Safety/Judgement: Decreased awareness of safety;Decreased awareness of deficits Awareness: Emergent Problem Solving: Slow processing     General Comments       Exercises     Shoulder Instructions      Home Living Family/patient expects to be discharged to:: Private residence Living Arrangements: Parent Available Help at Discharge: Family;Available 24 hours/day Type of Home: House Home Access: Stairs to enter [employer] of Steps: 2 Entrance Stairs-Rails: Right Home Layout: Two level;Able to live on main level with bedroom/bathroom     Bathroom Shower/Tub: Tub/shower unit;Walk-in shower   Bathroom Toilet: Standard     Home Equipment: None          Prior Functioning/Environment Level of Independence: Independent        Comments: Pt was in [place] for 100 days for ETOH rehab and just returned before Christmas, was staying with his dad for 1 week and then moved in with his brother in [place].  Was there two days before he broke his sobriety. His father said he must have walked for miles to get to the [employer] [facility] store as he does not have a license.          OT Problem List: Decreased strength;Decreased activity tolerance;Impaired balance (sitting and/or standing);Decreased coordination;Decreased cognition;Decreased safety awareness;Decreased knowledge of use of DME or AE;Cardiopulmonary status limiting activity;Pain      OT Treatment/Interventions: Self-care/ADL training;Therapeutic exercise;Neuromuscular education;Energy conservation;DME and/or AE instruction;Therapeutic  activities;Cognitive remediation/compensation;Patient/family education;Balance training    OT Goals(Current goals can be found in the care plan section) Acute Rehab OT Goals Patient Stated Goal: to get better OT Goal Formulation: With patient Time For Goal Achievement: 03/20/19 Potential to Achieve Goals: Good  OT Frequency: Min 3X/week   Barriers to D/C:            Co-evaluation              AM-PAC OT "6 Clicks" Daily Activity     Outcome Measure Help from another person eating meals?: A Little Help from another person taking care of personal grooming?: A Little Help from another person toileting, which includes using toliet, bedpan, or urinal?: A Lot Help from another person bathing (including washing, rinsing, drying)?: A Lot Help from another person to put on and taking off regular upper body clothing?: A Little Help from another person to put on and taking off regular lower body clothing?: A Lot 6 Click Score: 15   End of Session Nurse Communication: Mobility status  Activity Tolerance: Patient tolerated treatment well Patient left: in bed;with call bell/phone within reach;with bed alarm set  OT Visit Diagnosis: Unsteadiness on feet (R26.81);Other abnormalities of gait and mobility (R26.89);Muscle weakness (generalized) (M62.81);Other symptoms and signs involving cognitive function;Pain Pain - part of body: (mouth)                Time: 5784-6962 OT Time Calculation (min): 67 min Charges:  OT General Charges $OT Visit: 1  Visit OT Evaluation $OT Eval High Complexity: 1 High OT Treatments $Self Care/Home Management : 23-37 mins $Therapeutic Activity: 8-22 mins  Luisa Dago, OT/L   Acute OT Clinical Specialist Acute Rehabilitation Services Pager [phone] Office [phone]   [employer] 03/06/2019, 5:09 PM

## 2019-03-06 NOTE — Progress Notes (Signed)
[facility] Progress Note   Home meds: - insulin glargine 15u hs/ insulin lispro 5 tid sq  Assessment/ Plan:   1. AKI - in setting of DKA and COVID infection. Shock is likely cause of ATN, oliguric. CPK was up slightly 1750 but not responsible for AKI. CRRT  1/15-1/17. -off pressors, awake  and extubated - UOP 1200/800/1150 past 72 hrs > will hold off on restarting RRT and see if renal fxn will start  Recovering. No acute indication for RRT but renal function continuing to trend the wrong direction. Would like to continue to hold off; if he becomes uremic then may need transfer to [facility] for iHD.  2. COVID + infection- per CCM/ triad 3. Thrombocytopenia -still low but improving, smear neg for schistocytes. Suspected DIC. 4. Shock - improving, pressorsoff 5. DKA - per CCM 6. Alcoholic hepatitis 7. Vol excess -improving 8. Anemia - will consult pharm for ESA dosing.  Subjective:   Complaining of generalized body aches and sore throat. Just overall not comfortable.   Objective:   BP 122/67   Pulse 77   Temp 98.6 F (37 C) (Axillary)   Resp (!) 37   Ht '6\' 1"'$  (1.854 m)   Wt 97 kg   SpO2 94%   BMI 28.21 kg/m   Intake/Output Summary (Last 24 hours) at 03/06/2019 1124 Last data filed at 03/06/2019 0400 Gross per 24 hour  Intake 489.18 ml  Output 1150 ml  Net -660.82 ml   Weight change: -0.9 kg  Physical Exam: RIJ temp cath for crrt Awake, off the vent Chest clear ant/ lat Cor reg  Abd no ascites Ext diffuse tr  edema   Imaging: DG CHEST PORT 1 VIEW  Result Date: 03/05/2019 CLINICAL DATA:  Pneumonia, COVID-19, type II diabetes mellitus at, smoker, ethanol abuse EXAM: PORTABLE CHEST 1 VIEW COMPARISON:  Portable exam 1401 hours compared to 03/02/2019 FINDINGS: Interval removal of endotracheal and nasogastric tubes. RIGHT jugular central venous catheter with tip projecting over SVC. Normal heart size mediastinal contours. Patchy  BILATERAL pulmonary infiltrates consistent with pneumonia, slightly increased in LEFT upper lobe since previous study. No pleural effusion or pneumothorax. IMPRESSION: BILATERAL pulmonary infiltrates consistent with multifocal pneumonia, slightly increased in LEFT upper lobe since 03/02/2019. Electronically Signed   By: Lavonia Dana M.D.   On: 03/05/2019 14:22   ECHOCARDIOGRAM LIMITED  Result Date: 03/04/2019   ECHOCARDIOGRAM LIMITED REPORT   Patient Name:   Shawn Watkins Date of Exam: 03/04/2019 Medical Rec #:  [id number]       Height:       73.0 in Accession #:    [id number]      Weight:       216.7 lb Date of Birth:  11-Apr-1980       BSA:          2.23 m Patient Age:    39 years        BP:           113/82 mmHg Patient Gender: M               HR:           108 bpm. Exam Location:  Inpatient  Procedure: Limited Echo, Limited Color Doppler and Cardiac Doppler Indications:    elevated troponin  History:        Patient has prior history of Echocardiogram examinations, most  recent 02/26/2019.  Sonographer:    Johny Chess Referring Phys: [place]  1. Left ventricular ejection fraction, by visual estimation, is 60 to 65%. The left ventricle has normal function. There is no increased left ventricular wall thickness.  2. Global right ventricle has normal systolc function.The right ventricular size is normal. no increase in right ventricular wall thickness.  3. The mitral valve is normal in structure. Trivial mitral valve regurgitation. No evidence of mitral stenosis.  4. The tricuspid valve was normal in structure. Tricuspid valve regurgitation is mild.  5. Tricuspid valve regurgitation is mild.  6. No evidence of aortic valve sclerosis or stenosis.  7. The inferior vena cava is normal in size with greater than 50% respiratory variability, suggesting right atrial pressure of 3 mmHg. FINDINGS  Left Ventricle: Left ventricular ejection fraction, by visual estimation, is 60 to  65%. The left ventricle has normal function. There is no increased left ventricular wall thickness. Left ventricular diastolic parameters were normal. Normal left atrial pressure. Right Ventricle: The right ventricular size is normal. No increase in right ventricular wall thickness. Global RV systolic function is has normal systolic function. Left Atrium: Left atrial size was normal in size. Right Atrium: Right atrial size was normal in size. Right atrial pressure is estimated at 3 mmHg. Pericardium: There is no evidence of pericardial effusion is seen. There is no evidence of pericardial effusion. Mitral Valve: The mitral valve is normal in structure. There is mild thickening of the mitral valve leaflet(s). No evidence of mitral valve stenosis by observation. Trivial mitral valve regurgitation. Tricuspid Valve: The tricuspid valve is normal in structure. Tricuspid valve regurgitation is mild. Aortic Valve: The aortic valve is normal in structure. Aortic valve regurgitation is not visualized. The aortic valve is structurally normal, with no evidence of sclerosis or stenosis. Pulmonic Valve: The pulmonic valve was not well visualized. Pulmonic valve regurgitation is trivial by color flow Doppler. Pulmonic regurgitation is trivial by color flow Doppler. Aorta: The aortic root, ascending aorta and aortic arch are all structurally normal, with no evidence of dilitation or obstruction. Venous: The inferior vena cava is normal in size with greater than 50% respiratory variability, suggesting right atrial pressure of 3 mmHg. Shunts: There is no evidence of a patent foramen ovale. No ventricular septal defect is seen or detected. There is no evidence of an atrial septal defect. No atrial level shunt detected by color flow Doppler.  LEFT VENTRICLE          Normals PLAX 2D LVIDd:         4.88 cm  3.6 cm LVIDs:         3.63 cm  1.7 cm LV PW:         1.00 cm  1.4 cm LV IVS:        1.00 cm  1.3 cm LVOT diam:     2.30 cm  2.0 cm  LV SV:         56 ml    79 ml LV SV Index:   24.79    45 ml/m2 LVOT Area:     4.15 cm 3.14 cm2  LEFT ATRIUM         Index LA diam:    3.70 cm 1.66 cm/m  AORTIC VALVE             Normals LVOT Vmax:   96.10 cm/s LVOT Vmean:  64.700 cm/s 75 cm/s LVOT VTI:    0.182 m  25.3 cm  AORTA                 Normals Ao Root diam: 3.30 cm 31 mm  SHUNTS Systemic VTI:  0.18 m Systemic Diam: 2.30 cm  Jenkins Rouge MD Electronically signed by Jenkins Rouge MD Signature Date/Time: 03/04/2019/3:15:53 PMThe mitral valve is normal in structure.    Final     Labs: BMET Recent Labs  Lab 03/02/19 0400 03/02/19 2202 03/03/19 0515 03/03/19 1615 03/04/19 0525 03/05/19 0520 03/06/19 0510  NA 141 140 140 140 139 140 143  K 4.5 4.4 4.3 3.8 4.0 4.0 3.9  CL 106 104 105 106 104 107 109  CO2 20* 21* '25 27 27 25 24  '$ GLUCOSE 156* 238* 224* 163* 121* 148* 151*  BUN 94* 88* 83* 69* 50* 50* 54*  CREATININE 6.70* 5.54* 4.88* 4.02* 3.17* 3.53* 4.63*  CALCIUM 6.4* 7.1* 7.1* 7.1* 7.1* 7.0* 7.3*  PHOS  --   --  2.0* 1.3* 1.5* 3.5 3.6   CBC Recent Labs  Lab 02/28/19 0546 02/28/19 0546 03/01/19 0401 03/01/19 0533 03/02/19 0400 03/03/19 0515 03/04/19 0525 03/05/19 0520  WBC 28.6*   < > 16.1*   < > 9.0 7.9 6.1 9.5  NEUTROABS 27.2*  --  14.8*  --  7.2 5.5  --   --   HGB 11.0*   < > 9.9*   < > 9.8* 8.5* 8.3* 7.7*  HCT 30.5*   < > 27.2*   < > 26.4* 24.1* 22.7* 21.8*  MCV 82.2   < > 82.4   < > 84.9 83.4 86.3 84.5  PLT 32*   < > 18*   < > 17* 28* 26* 65*   < > = values in this interval not displayed.    Medications:    . chlordiazePOXIDE  25 mg Oral TID   Followed by  . [START ON 03/07/2019] chlordiazePOXIDE  25 mg Oral BH-qamhs   Followed by  . [START ON 03/08/2019] chlordiazePOXIDE  25 mg Oral Daily  . chlorhexidine gluconate (MEDLINE KIT)  15 mL Mouth Rinse BID  . Chlorhexidine Gluconate Cloth  6 each Topical Daily  . folic acid  1 mg Oral Daily  . influenza vac split quadrivalent PF  0.5 mL Intramuscular  Tomorrow-1000  . insulin aspart  0-9 Units Subcutaneous Q4H  . insulin glargine  5 Units Subcutaneous Daily  . lactulose  30 g Oral Daily  . mouth rinse  15 mL Mouth Rinse 10 times per day  . thiamine  100 mg Oral Daily  . valACYclovir  500 mg Oral BID      Otelia Santee, MD 03/06/2019, 11:24 AM

## 2019-03-06 NOTE — Evaluation (Addendum)
Clinical/Bedside Swallow Evaluation Patient Details  Name: Shawn Watkins MRN: [id number] Date of Birth: Mar 29, 1980  Today's Date: 03/06/2019 Time: SLP Start Time (ACUTE ONLY): 0855 SLP Stop Time (ACUTE ONLY): 0937 SLP Time Calculation (min) (ACUTE ONLY): 42 min  Past Medical History:  Past Medical History:  Diagnosis Date  . Alcoholic hepatitis ~ 2011, 07/2016, 01/2017   course of prednisolone in 07/2016  . Delirium tremens (HCC) 07/2016   also hx ETOH withdrawal seizures prior to 2018  . Depression   . ETOH abuse 07/17/2016  . Pancreatitis 2006, 2017, 07/2016   due to ETOH  . Tobacco abuse 07/17/2016  . Transaminitis 07/17/2016  . Type 2 diabetes mellitus (HCC) 02/06/2017   Past Surgical History: History reviewed. No pertinent surgical history. HPI:  39 y.o. male admitted on 02/26/19 for for DKA, ETOH 215, COVID-19.  PMH: alcoholism (per father 10 years), depression, alcoholic hepatitis. Was dx 02/18/19 with COVID, however, left [facility] ED AMA. Intubated 02/26/19-03/04/19 and transferred to [facility]. Started on CRRT on 03/02/19, hypotensive and bradycardic.     Assessment / Plan / Recommendation Clinical Impression  Pt was alert, tolerating room air with SpO2 in low 90's with support needed to comprehend situation from cognitive standpoint. Upper/lower lips are significantly scabbed and dry. Surprisingly oral cavity mostly pink and moist with lingual excrescence/lesion not ready to remove. Ocassional leakage of secretions increasing with trials ice chips and water due to reduced labial seal and control of bolus. He is not audibly congested at baseline and pharyngealy he did not have overt throat clearing or coughing but facial grimace during weak appearing swallows. He affirms odonophagia. Multiple fast swallows, widened and watery eyes with 2-4 sips water. Mulitple swallows with trials including pudding thinned by therapist to thicker honey consistency. Became mildly dyspniec with O2 sats remaining stable  in low 90's. Recommend pt initiate ice chips, small cup sips thin water only with full supervision. Continue oral care every four hours as able. Pt has been having meds in applesauce -can continue this. ST will return for ability to progress ( likely needs instrumental).      SLP Visit Diagnosis: Dysphagia, unspecified (R13.10)    Aspiration Risk  Moderate aspiration risk    Diet Recommendation Ice chips PRN after oral care;NPO except meds(crushed in puree)   Medication Administration: Crushed with puree    Other  Recommendations Oral Care Recommendations: Oral care QID;Oral care prior to ice chip/H20   Follow up Recommendations Inpatient Rehab      Frequency and Duration min 2x/week  2 weeks       Prognosis Prognosis for Safe Diet Advancement: Good      Swallow Study   General HPI: 39 y.o. male admitted on 02/26/19 for for DKA, ETOH 215, COVID-19.  PMH: alcoholism (per father 10 years), depression, alcoholic hepatitis. Was dx 02/18/19 with COVID, however, left [facility] ED AMA. Intubated 02/26/19-03/04/19 and transferred to [facility]. Started on CRRT on 03/02/19, hypotensive and bradycardic.   Type of Study: Bedside Swallow Evaluation Previous Swallow Assessment: (none) Diet Prior to this Study: NPO Temperature Spikes Noted: No Respiratory Status: Other (comment)(HFNC 3 L) History of Recent Intubation: Yes Length of Intubations (days): 7 days Date extubated: 03/04/19 Behavior/Cognition: Alert;Cooperative;Confused;Requires cueing Oral Cavity Assessment: Dried secretions;Excessive secretions;Lesions Oral Care Completed by SLP: Yes Oral Cavity - Dentition: Adequate natural dentition Vision: Functional for self-feeding Self-Feeding Abilities: Needs assist;Able to feed self Patient Positioning: Upright in bed Baseline Vocal Quality: Low vocal intensity Volitional Cough: Strong Volitional Swallow: Able  to elicit    Oral/Motor/Sensory Function Overall Oral Motor/Sensory Function: Other  (comment)(limited given labial scabs/lesion/pain)   Ice Chips Ice chips: Impaired Presentation: Spoon Oral Phase Impairments: Reduced labial seal Oral Phase Functional Implications: Prolonged oral transit;Left anterior spillage;Right anterior spillage;Other (comment)(water from melted ice) Pharyngeal Phase Impairments: Multiple swallows;Other (comments)(effortful swallow, dyspnea)   Thin Liquid Thin Liquid: Impaired Presentation: Cup;Spoon Oral Phase Impairments: Reduced labial seal;Reduced lingual movement/coordination Oral Phase Functional Implications: Right anterior spillage;Left anterior spillage Pharyngeal  Phase Impairments: Multiple swallows(effortful swallow, dyspnea)    Nectar Thick Nectar Thick Liquid: Not tested   Honey Thick Honey Thick Liquid: Not tested   Puree Puree: Impaired(thinned down pudding) Presentation: Spoon Oral Phase Impairments: Reduced lingual movement/coordination Pharyngeal Phase Impairments: Multiple swallows   Solid     Solid: Not tested      [name] 03/06/2019,11:09 AM  Orbie Pyo Colvin Caroli.Ed Risk analyst [phone] Office [phone]

## 2019-03-06 NOTE — Consult Note (Signed)
Physical Medicine and Rehabilitation Consult Reason for Consult: Impaired mobility and ADLs Referring Physician: Dr. Lynnell Catalan Referring Therapist: Andrey Campanile   HPI: Shawn Watkins is a 39 y.o. male with PMH alcohol abuse recently in substance abuse program, admitted on 1/11 with DKA after being diagnosed with COVID-19 on 1/3. He was intubated on 1/11 and started on CRRT on 1/15, extubated on 1/17.    ROS +pain in mouth, chest, back, legs. Denies constipation. Sleeping poorly at night. +depressed mood. Otherwise negative.  Past Medical History:  Diagnosis Date  . Alcoholic hepatitis ~ 2011, 07/2016, 01/2017   course of prednisolone in 07/2016  . Delirium tremens (HCC) 07/2016   also hx ETOH withdrawal seizures prior to 2018  . Depression   . ETOH abuse 07/17/2016  . Pancreatitis 2006, 2017, 07/2016   due to ETOH  . Tobacco abuse 07/17/2016  . Transaminitis 07/17/2016  . Type 2 diabetes mellitus (HCC) 02/06/2017   History reviewed. No pertinent surgical history. No family history on file. Social History:  reports that he has been smoking cigarettes. He started smoking about 12 years ago. He has been smoking about 1.00 pack per day. He has never used smokeless tobacco. He reports current alcohol use. He reports that he does not use drugs. Allergies: No Known Allergies Medications Prior to Admission  Medication Sig Dispense Refill  . insulin glargine (LANTUS) 100 UNIT/ML injection Inject 15 Units into the skin at bedtime.    . insulin lispro (ADMELOG) 100 UNIT/ML injection Inject 5 Units into the skin 3 (three) times daily with meals.    . ondansetron (ZOFRAN ODT) 4 MG disintegrating tablet Take 1 tablet (4 mg total) by mouth every 8 (eight) hours as needed for nausea or vomiting. 20 tablet 0  . pantoprazole (PROTONIX) 20 MG tablet Take 20 mg by mouth daily.      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 He has been struggling with sobriety for 10 years now.  Birth mother, Marylu Lund lives in [place] and is bipolar (not a great relationship there).   Functional History: Prior Function Level of Independence: Independent Comments: Pt was in [place] for 100 days for ETOH rehab and just returned before Christmas, was staying with his dad for 1 week and then moved in with his brother in [place].  Was there two days before he broke his sobriety. His father said he must have walked for miles to get to the [facility] store as he does not have a license.   Functional Status:  Mobility: Bed Mobility Overal bed mobility: Needs Assistance Bed Mobility: Supine to Sit, Sit to Supine Supine to sit: Mod assist, HOB elevated Sit to supine: Mod assist, HOB elevated General bed mobility comments: Mod assist to help progress LEs over EOB and then separately support trunk to come to sitting.  Cues for sequencing and hand placement.  Transfers General transfer comment: NT, pt too weak and would  require two person assist.  Just sat EOB today.       ADL:    Cognition: Cognition Overall Cognitive Status: Impaired/Different from baseline Orientation Level: Oriented X4 Cognition Arousal/Alertness: Awake/alert Behavior During Therapy: WFL for tasks assessed/performed Overall Cognitive Status: Impaired/Different from baseline Area of Impairment:  Orientation, Attention, Memory, Following commands, Safety/judgement, Awareness, Problem solving Orientation Level: Disoriented to, Place, Time, Situation Current Attention Level: Sustained Memory: Decreased short-term memory Following Commands: Follows one step commands with increased time Safety/Judgement: Decreased awareness of deficits Awareness: Emergent Problem Solving: Slow processing, Decreased initiation, Difficulty sequencing, Requires verbal cues, Requires tactile cues General Comments: Pt calm, cooperative, slow to process commands, low tone voice and vocal quality, does not remember much about his admission or circumstances around his admission.  Tells me he lives with his brother, Shawn Watkins which is confirmed by his father.    Blood pressure 119/74, pulse 82, temperature 98.8 F (37.1 C), temperature source Axillary, resp. rate (!) 36, height 6\' 1"  (1.854 m), weight 97 kg, SpO2 93 %. Physical Exam  Gen: no distress, normal appearing HEENT: oral mucosa pink and moist, NCAT Cardio: Reg rate Chest: normal effort, normal rate of breathing Abd: soft, non-distended Ext: no edema Skin: intact Neuro: AOx3. Musculoskeletal: 4-/5 strength throughout, limited by fatigue  Psych: pleasant, normal affect  Results for orders placed or performed during the hospital encounter of 02/26/19 (from the past 24 hour(s))  Glucose, capillary     Status: Abnormal   Collection Time: 03/05/19  4:37 PM  Result Value Ref Range   Glucose-Capillary 134 (H) 70 - 99 mg/dL  Glucose, capillary     Status: Abnormal   Collection Time: 03/05/19  7:53 PM  Result Value Ref Range   Glucose-Capillary 137 (H) 70 - 99 mg/dL  Hepatic function panel     Status: Abnormal   Collection Time: 03/06/19  5:10 AM  Result Value Ref Range   Total Protein 5.0 (L) 6.5 - 8.1 g/dL   Albumin 2.0 (L) 3.5 - 5.0 g/dL   AST 57 (H) 15 - 41 U/L   ALT 87 (H) 0 - 44 U/L   Alkaline Phosphatase 295 (H) 38 - 126 U/L   Total Bilirubin  0.9 0.3 - 1.2 mg/dL   Bilirubin, Direct 0.2 0.0 - 0.2 mg/dL   Indirect Bilirubin 0.7 0.3 - 0.9 mg/dL  Renal function panel     Status: Abnormal   Collection Time: 03/06/19  5:10 AM  Result Value Ref Range   Sodium 143 135 - 145 mmol/L   Potassium 3.9 3.5 - 5.1 mmol/L   Chloride 109 98 - 111 mmol/L   CO2 24 22 - 32 mmol/L   Glucose, Bld 151 (H) 70 - 99 mg/dL   BUN 54 (H) 6 - 20 mg/dL   Creatinine, Ser 1.614.63 (H) 0.61 - 1.24 mg/dL   Calcium 7.3 (L) 8.9 - 10.3 mg/dL   Phosphorus 3.6 2.5 - 4.6 mg/dL   Albumin 2.0 (L) 3.5 - 5.0 g/dL   GFR calc non Af Amer 15 (L) >60 mL/min   GFR calc Af Amer 17 (L) >60 mL/min   Anion gap 10 5 - 15  Glucose, capillary     Status: Abnormal   Collection Time: 03/06/19  5:28 AM  Result Value Ref Range   Glucose-Capillary 147 (H) 70 - 99 mg/dL   DG CHEST PORT 1 VIEW  Result Date: 03/05/2019 CLINICAL DATA:  Pneumonia, COVID-19, type II diabetes mellitus at, smoker, ethanol abuse EXAM: PORTABLE CHEST  1 VIEW COMPARISON:  Portable exam 1401 hours compared to 03/02/2019 FINDINGS: Interval removal of endotracheal and nasogastric tubes. RIGHT jugular central venous catheter with tip projecting over SVC. Normal heart size mediastinal contours. Patchy BILATERAL pulmonary infiltrates consistent with pneumonia, slightly increased in LEFT upper lobe since previous study. No pleural effusion or pneumothorax. IMPRESSION: BILATERAL pulmonary infiltrates consistent with multifocal pneumonia, slightly increased in LEFT upper lobe since 03/02/2019. Electronically Signed   By: Lavonia Dana M.D.   On: 03/05/2019 14:22   ECHOCARDIOGRAM LIMITED  Result Date: 03/04/2019   ECHOCARDIOGRAM LIMITED REPORT   Patient Name:   ALEKSANDER EDMISTON Date of Exam: 03/04/2019 Medical Rec #:  [id number]       Height:       73.0 in Accession #:    [id number]      Weight:       216.7 lb Date of Birth:  [birth year]       BSA:          2.23 m Patient Age:    38 years        BP:           113/82 mmHg Patient  Gender: M               HR:           108 bpm. Exam Location:  Inpatient  Procedure: Limited Echo, Limited Color Doppler and Cardiac Doppler Indications:    elevated troponin  History:        Patient has prior history of Echocardiogram examinations, most                 recent 02/26/2019.  Sonographer:    Johny Chess Referring Phys: [place]  1. Left ventricular ejection fraction, by visual estimation, is 60 to 65%. The left ventricle has normal function. There is no increased left ventricular wall thickness.  2. Global right ventricle has normal systolc function.The right ventricular size is normal. no increase in right ventricular wall thickness.  3. The mitral valve is normal in structure. Trivial mitral valve regurgitation. No evidence of mitral stenosis.  4. The tricuspid valve was normal in structure. Tricuspid valve regurgitation is mild.  5. Tricuspid valve regurgitation is mild.  6. No evidence of aortic valve sclerosis or stenosis.  7. The inferior vena cava is normal in size with greater than 50% respiratory variability, suggesting right atrial pressure of 3 mmHg. FINDINGS  Left Ventricle: Left ventricular ejection fraction, by visual estimation, is 60 to 65%. The left ventricle has normal function. There is no increased left ventricular wall thickness. Left ventricular diastolic parameters were normal. Normal left atrial pressure. Right Ventricle: The right ventricular size is normal. No increase in right ventricular wall thickness. Global RV systolic function is has normal systolic function. Left Atrium: Left atrial size was normal in size. Right Atrium: Right atrial size was normal in size. Right atrial pressure is estimated at 3 mmHg. Pericardium: There is no evidence of pericardial effusion is seen. There is no evidence of pericardial effusion. Mitral Valve: The mitral valve is normal in structure. There is mild thickening of the mitral valve leaflet(s). No evidence of  mitral valve stenosis by observation. Trivial mitral valve regurgitation. Tricuspid Valve: The tricuspid valve is normal in structure. Tricuspid valve regurgitation is mild. Aortic Valve: The aortic valve is normal in structure. Aortic valve regurgitation is not visualized. The aortic valve is structurally normal, with no evidence of sclerosis or stenosis. Pulmonic  Valve: The pulmonic valve was not well visualized. Pulmonic valve regurgitation is trivial by color flow Doppler. Pulmonic regurgitation is trivial by color flow Doppler. Aorta: The aortic root, ascending aorta and aortic arch are all structurally normal, with no evidence of dilitation or obstruction. Venous: The inferior vena cava is normal in size with greater than 50% respiratory variability, suggesting right atrial pressure of 3 mmHg. Shunts: There is no evidence of a patent foramen ovale. No ventricular septal defect is seen or detected. There is no evidence of an atrial septal defect. No atrial level shunt detected by color flow Doppler.  LEFT VENTRICLE          Normals PLAX 2D LVIDd:         4.88 cm  3.6 cm LVIDs:         3.63 cm  1.7 cm LV PW:         1.00 cm  1.4 cm LV IVS:        1.00 cm  1.3 cm LVOT diam:     2.30 cm  2.0 cm LV SV:         56 ml    79 ml LV SV Index:   24.79    45 ml/m2 LVOT Area:     4.15 cm 3.14 cm2  LEFT ATRIUM         Index LA diam:    3.70 cm 1.66 cm/m  AORTIC VALVE             Normals LVOT Vmax:   96.10 cm/s LVOT Vmean:  64.700 cm/s 75 cm/s LVOT VTI:    0.182 m     25.3 cm  AORTA                 Normals Ao Root diam: 3.30 cm 31 mm  SHUNTS Systemic VTI:  0.18 m Systemic Diam: 2.30 cm  Charlton Haws MD Electronically signed by Charlton Haws MD Signature Date/Time: 03/04/2019/3:15:53 PMThe mitral valve is normal in structure.    Final      Assessment/Plan: Diagnosis: Acute respiratory failure with hypoxemia 2/2 COVID-19 1. Does the need for close, 24 hr/day medical supervision in concert with the patient's rehab needs  make it unreasonable for this patient to be served in a less intensive setting? Yes 2. Co-Morbidities requiring supervision/potential complications: severe sepsis with septic shock, alcohol abuse, lactic acidosis, DKA, acute metabolic encephalopathy, AKI, elevated LFTs, hypotension due to hypovolemia, depression, alcoholic hepatitis.  3. Due to bladder management, bowel management, safety, skin/wound care, disease management, medication administration, pain management and patient education, does the patient require 24 hr/day rehab nursing? Yes 4. Does the patient require coordinated care of a physician, rehab nurse, therapy disciplines of PT, OT, SLP to address physical and functional deficits in the context of the above medical diagnosis(es)? Yes Addressing deficits in the following areas: balance, endurance, locomotion, strength, transferring, bowel/bladder control, bathing, dressing, feeding, grooming, toileting, cognition, speech, language, swallowing and psychosocial support 5. Can the patient actively participate in an intensive therapy program of at least 3 hrs of therapy per day at least 5 days per week? Yes 6. The potential for patient to make measurable gains while on inpatient rehab is good 7. Anticipated functional outcomes upon discharge from inpatient rehab are mod assist  with PT, mod assist with OT, min assist with SLP. 8. Estimated rehab length of stay to reach the above functional goals is: 2-3 weeks 9. Anticipated discharge destination: Home 10. Overall Rehab/Functional Prognosis: good  RECOMMENDATIONS: This patient's condition is appropriate for continued rehabilitative care in the following setting: CIR Patient has agreed to participate in recommended program. Yes once pain better controlled.  Note that insurance prior authorization may be required for reimbursement for recommended care.  Comment: Mr. Cowdrey would be a good candidate for CIR once medically stable  if agreeable to  discharge home with his parents following rehabilitation admission. Right now he is unsure about discharge plans and has not given [place] permission to communicate with his father.   He continues to complain of severe, diffuse pain. Can consider increasing oxycodone frequency to q4H prn.  Thank you for this consult. I will continue to follow in Mr. Reznick' care.  Horton Chin, MD 03/06/2019

## 2019-03-06 NOTE — Progress Notes (Signed)
NAME:  Shawn Watkins, MRN:  [id number], DOB:  1980/07/08, LOS: 8 ADMISSION DATE:  02/26/2019, CONSULTATION DATE:  1/11 REFERRING MD:  Roderic Palau, CHIEF COMPLAINT:  Dyspnea, DKA, COVID   Brief History   39 y/o male seen 1/3 with nausea, vomiting, sore throat and body aches.  Diagnosed with COVID, then left for home AMA.  Returned 1/11 to [facility], in DKA, intubated for delirium and transferred to [facility].  Course complicated by AKI and persistent hypotension  Past Medical History  DM I ETOH Abuse - prior alcoholic hepatitis, DT's with ETOH withdrawal seizures, pancreatitis  Depression   Significant Hospital Events   1/03 Seen in [facility] ER, dx with COVID, left AMA  1/11 Admitted with DKA, AMS, intubated.  Tx to [facility] from [facility] 1/11 hypotensive on 3 pressors 1/15 start CRRT 1/16 Bradycardia overnight on Precedex, changed to Versed Dopamine added but resulted in bigeminy, held 1/17: Extubated 1/18: Weaning Precedex, adding Librium taper, Foley catheter replaced due to requiring frequent in and out catheterization  Consults:  TRH  PCCM   Procedures:  ETT 1/11 >> 1/17 R Fem TLC 1/11 >> 1/15 Right IJ HD catheter 1/15 >>   Significant Diagnostic Tests:  UDS 1/11 (post arrival to [facility]) >> pos BDZ ECHO 1/11 >> nml LV fn 1/12 CT abdomen/pelvis without contrast >> diffuse small bowel thickening, thickening of transverse colon?  Shock bowel, hepatic steatosis  Micro Data:  COVID 1/03 >> positive  BCx2 1/11 >> ng UA 1/11 >> negative, + glucose UC 1/11 >> ng  Antimicrobials:  Vanco 1/11 >> 1/13  Cefepime 1/11 >>  completed   Interim history/subjective:  Feeling better.  Reports he feels really weak.   Objective   Blood pressure 122/67, pulse 77, temperature 98.6 F (37 C), temperature source Axillary, resp. rate (Abnormal) 37, height 6\' 1"  (1.854 m), weight 97 kg, SpO2 94 %.        Intake/Output Summary (Last 24 hours) at 03/06/2019 0908 Last data filed at 03/06/2019 0400 Gross per 24 hour    Intake 540.75 ml  Output 1150 ml  Net -609.25 ml   Filed Weights   03/04/19 0500 03/05/19 0500 03/06/19 0500  Weight: 98.3 kg 97.9 kg 97 kg    Examination: General this is a 39 year old male who remains in the ICU s/p critical illness HENT lips blistered and painful MMM right IJ CVL dressing partially intact awaiting nursing staff to change pulm decreased both bases  Currently on room air Card RRR abd not tender Ext warm and dry dependent edema persists Neuro awake. More interactive generalized weakness w/out focal def.   Resolved Hospital Problem list   Bradycardia, likely due to Precedex Diabetic ketoacidosis Lactic acidosis Septic shock Aspiration PNA (completed rx 1/16) Drug related hypotension (resolved once precedex stopped) Assessment & Plan:   Acute encephalopathy: toxic metabolic encephalopathy (2/2 DKA, renal failure, hepatic encephalopathy and withdrawal) Alcohol abuse> father said he relapsed 01/2019 Plan Librium taper  Thiamine and folate  Supportive care   Acute hypoxic respiratory failure 2/2 COVID PNA and possible aspiration  Dense bilateral atelectasis w/ small bilateral effusions  (confirmed by [place] 1/19) Pcxr: hazy bilateral airspace disease.  Bedside ultrasound personally assessed: shows hepatomegaly w/elevated right HD, small bilateral free flowing effusions and dense bilateral lung consolidations  -completed abx Plan Wean oxygen Pulse ox Asp & reflux precautions  Flutter and IS   Type 1 diabetes DKA resolved, glycemic control now acceptable Plan Cont current ssi and lantus  AKI  2/2 Rhabdomyolysis;  Now off CRRT since 1/17 scr climbed some.  Plan Cont strict I&O Daily chem  Avoid nephro-toxins Adding foley cath for better I&O Following renal fxn. No acute indication for CRRT currently   Stress non-STEMI.  Normal LV function, no wall motion abnormality  peak Tp 5k Plan Cont tele Add asa Will need formal cards referral after dc    COVID 19 Completed steroids Remdesivir not given due to elevated LFTs, now 2 weeks out Plan resp isolation   Alcoholic hepatitis, acute Hepatic steatosis> alcohol related most likely Plan PRN LFTs   Severe Thrombocytopenia and anemia : thrombocytopenmia possibly alcohol-related?? ->improving. Anemia stable likely 2/2 critical illness Plan Follow   Large pressure wound on buttocks Plan Cont wound care   Best practice:  Diet: resume tube feeding Pain/Anxiety/Delirium protocol (if indicated): RASS goal -1 VAP protocol (if indicated): yes DVT prophylaxis: SCD GI prophylaxis: continue pantoprazole Glucose control: SSI Mobility: bed rest Code Status: Full Family Communication:  father daily  Disposition: ICU  My cct 34 minutes  Simonne Martinet ACNP-BC [facility] Pulmonary/Critical Care Pager # [phone] OR # [phone] if no answer   03/06/2019

## 2019-03-06 NOTE — Progress Notes (Signed)
Nutrition Follow-up  RD working remotely.  DOCUMENTATION CODES:   Not applicable  INTERVENTION:    Diet advancement as able per SLP.  If unable to safely advance diet, recommend placing Cortrak tube (Cortrak service available 1/20, Wednesday). Osmolite 1.5 at 70 ml/h with Pro-stat 30 ml BID would provide 2720 kcal, 135 gm protein daily.   NUTRITION DIAGNOSIS:   Increased nutrient needs related to acute illness(COVID) as evidenced by estimated needs.  Ongoing   GOAL:   Patient will meet greater than or equal to 90% of their needs  Unmet  MONITOR:   Vent status, Labs, Weight trends, Skin, I & O's  ASSESSMENT:   39 yo male admitted with AMS and increased WOB, DKA. Required intubation on admission. COVID positive on 1/3. PMH includes DM (on insulin at home), ETOH abuse, pancreatitis, depression, alcoholic hepatitis.  Extubated 1/17. TF off since extubation. CRRT stopped 1/17.  No acute CRRT needs at this time.   SLP following; patient remains NPO s/p bedside swallow evaluation today.   Labs reviewed. BUN 54 (H), creatinine 4.63 (H) CBG's: 137-147  Medications reviewed and include decadron, folic acid, novolog, lantus, lactulose, thiamine.  No new weight available.   Diet Order:   Diet Order            Diet NPO time specified  Diet effective now              EDUCATION NEEDS:   Not appropriate for education at this time  Skin:  Skin Assessment: Skin Integrity Issues: Skin Integrity Issues:: DTI, Stage II DTI: sacrum, lip, buttocks Stage I: N/A Stage II: R ear  Last BM:  1/17  Height:   Ht Readings from Last 1 Encounters:  02/26/19 6\' 1"  (1.854 m)    Weight:   Wt Readings from Last 1 Encounters:  03/06/19 97 kg    Ideal Body Weight:  83.6 kg  BMI:  Body mass index is 28.21 kg/m.  Estimated Nutritional Needs:   Kcal:  2600-2900  Protein:  120-135 gm  Fluid:  >/= 2.4 L    03/08/19, RD, LDN, CNSC Pager [phone] After  Hours Pager [phone]

## 2019-03-06 NOTE — Progress Notes (Signed)
MEDICATION RELATED CONSULT NOTE - INITIAL   Pharmacy Consult for Darbepoetin Indication: Anemia of CKD   No Known Allergies  Patient Measurements: Height: 6\' 1"  (185.4 cm) Weight: 213 lb 13.5 oz (97 kg) IBW/kg (Calculated) : 79.9   Vital Signs: Temp: 98.8 F (37.1 C) (01/19 1100) Temp Source: Axillary (01/19 1100) BP: 121/78 (01/19 1100) Pulse Rate: 73 (01/19 1100) Intake/Output from previous day: 01/18 0701 - 01/19 0700 In: 540.8 [I.V.:540.8] Out: 1150 [Urine:1150] Intake/Output from this shift: No intake/output data recorded.  Labs: Recent Labs    03/04/19 0525 03/05/19 0520 03/06/19 0510  WBC 6.1 9.5  --   HGB 8.3* 7.7*  --   HCT 22.7* 21.8*  --   PLT 26* 65*  --   APTT 28  --   --   CREATININE 3.17* 3.53* 4.63*  MG 2.0  --   --   PHOS 1.5* 3.5 3.6  ALBUMIN 1.9*  2.0* 1.9*  2.0* 2.0*  2.0*  PROT 4.4* 4.7* 5.0*  AST 98* 101* 57*  ALT 87* 109* 87*  ALKPHOS 245* 279* 295*  BILITOT 1.4* 1.3* 0.9  BILIDIR 0.3* 0.2 0.2  IBILI 1.1* 1.1* 0.7   Estimated Creatinine Clearance: 26.8 mL/min (A) (by C-G formula based on SCr of 4.63 mg/dL (H)).   Microbiology: Recent Results (from the past 720 hour(s))  Novel Coronavirus, NAA (Hosp order, Send-out to Ref Lab; TAT 18-24 hrs     Status: Abnormal   Collection Time: 02/18/19  1:49 PM   Specimen: Nasopharyngeal Swab; Respiratory  Result Value Ref Range Status   SARS-CoV-2, NAA DETECTED (A) NOT DETECTED Final    Comment: (NOTE)                  Client Requested Flag This nucleic acid amplification test was developed and its performance characteristics determined by [employer]. Nucleic acid amplification tests include PCR and TMA. This test has not been FDA cleared or approved. This test has been authorized by FDA under an Emergency Use Authorization (EUA). This test is only authorized for the duration of time the declaration that circumstances exist justifying the authorization of the emergency use of in  vitro diagnostic tests for detection of SARS-CoV-2 virus and/or diagnosis of COVID-19 infection under section 564(b)(1) of the Act, 21 U.S.C. 299BZJ-6(R) (1), unless the authorization is terminated or revoked sooner. When diagnostic testing is negative, the possibility of a false negative result should be considered in the context of a patient's recent exposures and the presence of clinical signs and symptoms consistent with COVID-19. An individual without symptoms of COVID-  19 and who is not shedding SARS-CoV-2 virus would expect to have a negative (not detected) result in this assay. Performed At: [facility] [address] [place], [place] [id number] Rush Farmer MD BP:[id number]    [place]  Final    Comment: Performed at [facility], [address], [place], [place] [address]  Urine culture     Status: None   Collection Time: 02/26/19 12:38 AM   Specimen: Urine, Random  Result Value Ref Range Status   Specimen Description   Final    URINE, RANDOM Performed at [facility], [address], [place], [place] [address]    Special Requests   Final    Normal Performed at [facility], [address] [place],  [address]    Culture   Final    NO GROWTH Performed at [facility], [place],  [place] [address]    Report Status 02/27/2019 FINAL  Final  Blood Culture (routine x 2)     Status: None   Collection Time: 02/26/19  1:51 AM   Specimen: BLOOD RIGHT ARM  Result Value Ref Range Status   Specimen Description BLOOD RIGHT ARM  Final   Special Requests   Final    BOTTLES DRAWN AEROBIC ONLY Blood Culture results may not be optimal due to an inadequate volume of blood received in culture bottles   Culture   Final    NO GROWTH 5 DAYS Performed at [facility], [address], [place], [place] [address]    Report Status 03/03/2019 FINAL  Final  Blood Culture (routine x 2)     Status: None   Collection Time: 02/26/19   1:55 AM   Specimen: BLOOD RIGHT WRIST  Result Value Ref Range Status   Specimen Description BLOOD RIGHT WRIST  Final   Special Requests   Final    BOTTLES DRAWN AEROBIC ONLY Blood Culture adequate volume   Culture   Final    NO GROWTH 5 DAYS Performed at [facility], [address] [place], [place] [address]    Report Status 03/03/2019 FINAL  Final  MRSA PCR Screening     Status: None   Collection Time: 02/27/19  5:00 PM   Specimen: Nasopharyngeal  Result Value Ref Range Status   MRSA by PCR NEGATIVE NEGATIVE Final    Comment:        The GeneXpert MRSA Assay (FDA approved for NASAL specimens only), is one component of a comprehensive MRSA colonization surveillance program. It is not intended to diagnose MRSA infection nor to guide or monitor treatment for MRSA infections. Performed at [facility], [address], [place], [place] [address]     Medical History: Past Medical History:  Diagnosis Date  . Alcoholic hepatitis ~ 2011, 07/2016, 01/2017   course of prednisolone in 07/2016  . Delirium tremens (HCC) 07/2016   also hx ETOH withdrawal seizures prior to 2018  . Depression   . ETOH abuse 07/17/2016  . Pancreatitis 2006, 2017, 07/2016   due to ETOH  . Tobacco abuse 07/17/2016  . Transaminitis 07/17/2016  . Type 2 diabetes mellitus (HCC) 02/06/2017    Medications:  Medications Prior to Admission  Medication Sig Dispense Refill Last Dose  . insulin glargine (LANTUS) 100 UNIT/ML injection Inject 15 Units into the skin at bedtime.     . insulin lispro (ADMELOG) 100 UNIT/ML injection Inject 5 Units into the skin 3 (three) times daily with meals.     . ondansetron (ZOFRAN ODT) 4 MG disintegrating tablet Take 1 tablet (4 mg total) by mouth every 8 (eight) hours as needed for nausea or vomiting. 20 tablet 0   . pantoprazole (PROTONIX) 20 MG tablet Take 20 mg by mouth daily.       Assessment: 62 YOM with AKI in the setting of DKA and COVID infection.   Pharmacy consulted to start Aranesp for Anemia of CKD. H/H 7.7/21.8. Plt up 65k    Plan:  -Start Aranesp 40 mcg once weekly to start today -Monitor Hgb   Vinnie Level, PharmD., BCPS Clinical Pharmacist Clinical phone for 03/06/19 until 5pm: [phone]

## 2019-03-06 NOTE — Progress Notes (Signed)
Rehab Admissions Coordinator Note:  Per PT recommendation, this patient was screened by Cheri Rous for appropriateness for an Inpatient Acute Rehab Consult.  At this time, we are recommending an  Inpatient Rehab consult.   Please have attending MD place consult order in the chart to allow [place] to follow and further assess candidacy for CIR.   Of note, pt's initial COVID + test was 1/3. Pt will need to be >20 days post initial covid + test with improved symptoms or have two negative tests prior to being admitted to CIR.   Cheri Rous 03/06/2019, 7:38 AM  I can be reached at [phone].

## 2019-03-06 NOTE — Progress Notes (Signed)
[facility] P[facility]  History of cold sores Painful mouth ulcerations, possibly viral Valtrex  Heber Sherrill, MD [place] [facility] Pager: [phone] Cell: [phone] If no response, call [phone]

## 2019-03-07 DIAGNOSIS — D696 Thrombocytopenia, unspecified: Secondary | ICD-10-CM

## 2019-03-07 DIAGNOSIS — J1282 Pneumonia due to coronavirus disease 2019: Secondary | ICD-10-CM

## 2019-03-07 DIAGNOSIS — D649 Anemia, unspecified: Secondary | ICD-10-CM

## 2019-03-07 DIAGNOSIS — K1379 Other lesions of oral mucosa: Secondary | ICD-10-CM

## 2019-03-07 LAB — RENAL FUNCTION PANEL
Albumin: 2.1 g/dL — ABNORMAL LOW (ref 3.5–5.0)
Anion gap: 11 (ref 5–15)
BUN: 60 mg/dL — ABNORMAL HIGH (ref 6–20)
CO2: 23 mmol/L (ref 22–32)
Calcium: 7.3 mg/dL — ABNORMAL LOW (ref 8.9–10.3)
Chloride: 110 mmol/L (ref 98–111)
Creatinine, Ser: 4.63 mg/dL — ABNORMAL HIGH (ref 0.61–1.24)
GFR calc Af Amer: 17 mL/min — ABNORMAL LOW (ref >60)
GFR calc non Af Amer: 15 mL/min — ABNORMAL LOW (ref >60)
Glucose, Bld: 121 mg/dL — ABNORMAL HIGH (ref 70–99)
Phosphorus: 4 mg/dL (ref 2.5–4.6)
Potassium: 3.9 mmol/L (ref 3.5–5.1)
Sodium: 144 mmol/L (ref 135–145)

## 2019-03-07 LAB — GLUCOSE, CAPILLARY
Glucose-Capillary: 103 mg/dL — ABNORMAL HIGH (ref 70–99)
Glucose-Capillary: 104 mg/dL — ABNORMAL HIGH (ref 70–99)
Glucose-Capillary: 106 mg/dL — ABNORMAL HIGH (ref 70–99)
Glucose-Capillary: 111 mg/dL — ABNORMAL HIGH (ref 70–99)
Glucose-Capillary: 112 mg/dL — ABNORMAL HIGH (ref 70–99)
Glucose-Capillary: 113 mg/dL — ABNORMAL HIGH (ref 70–99)
Glucose-Capillary: 125 mg/dL — ABNORMAL HIGH (ref 70–99)
Glucose-Capillary: 159 mg/dL — ABNORMAL HIGH (ref 70–99)
Glucose-Capillary: 174 mg/dL — ABNORMAL HIGH (ref 70–99)
Glucose-Capillary: 65 mg/dL — ABNORMAL LOW (ref 70–99)
Glucose-Capillary: 92 mg/dL (ref 70–99)

## 2019-03-07 LAB — HEPATIC FUNCTION PANEL
ALT: 72 U/L — ABNORMAL HIGH (ref 0–44)
AST: 44 U/L — ABNORMAL HIGH (ref 15–41)
Albumin: 2 g/dL — ABNORMAL LOW (ref 3.5–5.0)
Alkaline Phosphatase: 278 U/L — ABNORMAL HIGH (ref 38–126)
Bilirubin, Direct: 0.2 mg/dL (ref 0.0–0.2)
Indirect Bilirubin: 0.8 mg/dL (ref 0.3–0.9)
Total Bilirubin: 1 mg/dL (ref 0.3–1.2)
Total Protein: 5.2 g/dL — ABNORMAL LOW (ref 6.5–8.1)

## 2019-03-07 MED ORDER — OSMOLITE 1.5 CAL PO LIQD
1000.0000 mL | ORAL | Status: DC
  Administered 2019-03-07: 1000 mL
  Filled 2019-03-07 (×2): qty 1000

## 2019-03-07 MED ORDER — MAGIC MOUTHWASH W/LIDOCAINE
10.0000 mL | Freq: Three times a day (TID) | ORAL | Status: DC
  Administered 2019-03-07 – 2019-03-13 (×18): 10 mL via ORAL
  Filled 2019-03-07 (×23): qty 10

## 2019-03-07 MED ORDER — PRO-STAT SUGAR FREE PO LIQD
30.0000 mL | Freq: Two times a day (BID) | ORAL | Status: DC
  Administered 2019-03-07 – 2019-03-10 (×6): 30 mL
  Filled 2019-03-07 (×6): qty 30

## 2019-03-07 MED ORDER — GUAIFENESIN 100 MG/5ML PO SOLN
5.0000 mL | ORAL | Status: DC | PRN
  Administered 2019-03-07 – 2019-03-11 (×2): 100 mg via ORAL
  Filled 2019-03-07 (×2): qty 15

## 2019-03-07 MED ORDER — OXYCODONE HCL 5 MG PO TABS
5.0000 mg | ORAL_TABLET | Freq: Once | ORAL | Status: AC
  Administered 2019-03-07: 5 mg via ORAL
  Filled 2019-03-07: qty 1

## 2019-03-07 NOTE — Progress Notes (Signed)
Inpatient Rehabilitation-Admissions Coordinator   [facility] will place rehab consult order in the chart to allow for further evaluation of pt's candidacy for CIR.   Of note, pt's initial COVID + test was 1/3. Pt will need to be >20 days post initial covid + test with improved symptoms or have two negative tests prior to being admitted to CIR.

## 2019-03-07 NOTE — Progress Notes (Signed)
FSBS glucose reading: 125 mg/dL. Machine not crossing information over into labs. See MAR for insulin coverage based off this reading.

## 2019-03-07 NOTE — Plan of Care (Signed)
  Problem: Education: Goal: Knowledge of risk factors and measures for prevention of condition will improve Outcome: Progressing   Problem: Coping: Goal: Psychosocial and spiritual needs will be supported Outcome: Progressing   Problem: Respiratory: Goal: Will maintain a patent airway Outcome: Progressing Goal: Complications related to the disease process, condition or treatment will be avoided or minimized Outcome: Progressing   Problem: Education: Goal: Ability to describe self-care measures that may prevent or decrease complications (Diabetes Survival Skills Education) will improve Outcome: Progressing Goal: Individualized Educational Video(s) Outcome: Progressing   Problem: Coping: Goal: Ability to adjust to condition or change in health will improve Outcome: Progressing   Problem: Fluid Volume: Goal: Ability to maintain a balanced intake and output will improve Outcome: Progressing   Problem: Health Behavior/Discharge Planning: Goal: Ability to identify and utilize available resources and services will improve Outcome: Progressing Goal: Ability to manage health-related needs will improve Outcome: Progressing   Problem: Metabolic: Goal: Ability to maintain appropriate glucose levels will improve Outcome: Progressing   Problem: Nutritional: Goal: Maintenance of adequate nutrition will improve Outcome: Progressing Goal: Progress toward achieving an optimal weight will improve Outcome: Progressing   Problem: Skin Integrity: Goal: Risk for impaired skin integrity will decrease Outcome: Progressing   Problem: Tissue Perfusion: Goal: Adequacy of tissue perfusion will improve Outcome: Progressing   Problem: Activity: Goal: Ability to tolerate increased activity will improve Outcome: Progressing   Problem: Respiratory: Goal: Ability to maintain a clear airway and adequate ventilation will improve Outcome: Progressing   Problem: Role Relationship: Goal: Method  of communication will improve Outcome: Progressing

## 2019-03-07 NOTE — Progress Notes (Signed)
 PROGRESS NOTE  Shawn Watkins MRN:[id number] DOB: 09/26/1980 DOA: 02/26/2019  PCP: Patient, No Pcp Per  Brief History/Interval Summary: 39 y/o male with a past medical history of type 1 diabetes, alcohol abuse, history of pancreatitis, depression, seizures, seen 1/3 with nausea, vomiting, sore throat and body aches.  Diagnosed with COVID, then left for home AMA.  Returned 1/11 to [facility], in DKA, intubated for delirium and transferred to [facility]. Course complicated by AKI and persistent hypotension.  Reason for Visit: Acute renal failure.  Diabetic ketoacidosis  Consultants: Pulmonology.  Nephrology  Procedures:   Transthoracic echocardiogram 03/04/2019  1. Left ventricular ejection fraction, by visual estimation, is 60 to 65%. The left ventricle has normal function. There is no increased left ventricular wall thickness.  2. Global right ventricle has normal systolc function.The right ventricular size is normal. no increase in right ventricular wall thickness.  3. The mitral valve is normal in structure. Trivial mitral valve regurgitation. No evidence of mitral stenosis.  4. The tricuspid valve was normal in structure. Tricuspid valve regurgitation is mild.  5. Tricuspid valve regurgitation is mild.  6. No evidence of aortic valve sclerosis or stenosis.  7. The inferior vena cava is normal in size with greater than 50% respiratory variability, suggesting right atrial pressure of 3 mmHg.  Antibiotics: Anti-infectives (From admission, onward)   Start     Dose/Rate Route Frequency Ordered Stop   03/06/19 0500  valACYclovir (VALTREX) tablet 500 mg     500 mg Oral 2 times daily 03/06/19 0344     03/03/19 0000  ceFEPIme (MAXIPIME) 2 g in sodium chloride 0.9 % 100 mL IVPB     2 g 200 mL/hr over 30 Minutes Intravenous Every 12 hours 03/02/19 1552 03/04/19 1415   02/28/19 1200  ceFEPIme (MAXIPIME) 2 g in sodium chloride 0.9 % 100 mL IVPB  Status:  Discontinued     2 g 200 mL/hr over 30 Minutes  Intravenous Every 24 hours 02/27/19 1539 03/02/19 1552   02/26/19 1400  ceFEPIme (MAXIPIME) 2 g in sodium chloride 0.9 % 100 mL IVPB  Status:  Discontinued     2 g 200 mL/hr over 30 Minutes Intravenous Every 12 hours 02/26/19 0912 02/27/19 1539   02/26/19 1200  ceFEPIme (MAXIPIME) 2 g in sodium chloride 0.9 % 100 mL IVPB  Status:  Discontinued     2 g 200 mL/hr over 30 Minutes Intravenous Every 8 hours 02/26/19 0249 02/26/19 0912   02/26/19 1032  vancomycin variable dose per unstable renal function (pharmacist dosing)  Status:  Discontinued      Does not apply See admin instructions 02/26/19 1032 02/28/19 1235   02/26/19 0300  vancomycin (VANCOREADY) IVPB 1750 mg/350 mL  Status:  Discontinued     1,750 mg 175 mL/hr over 120 Minutes Intravenous Every 12 hours 02/26/19 0247 02/26/19 1032   02/26/19 0245  ceFEPIme (MAXIPIME) 2 g in sodium chloride 0.9 % 100 mL IVPB     2 g 200 mL/hr over 30 Minutes Intravenous  Once 02/26/19 0240 02/26/19 0350   02/26/19 0245  metroNIDAZOLE (FLAGYL) IVPB 500 mg     500 mg 100 mL/hr over 60 Minutes Intravenous  Once 02/26/19 0240 02/26/19 0452   02/26/19 0245  vancomycin (VANCOCIN) IVPB 1000 mg/200 mL premix  Status:  Discontinued     1,000 mg 200 mL/hr over 60 Minutes Intravenous  Once 02/26/19 0240 02/26/19 0244   02/26/19 0245  vancomycin (VANCOREADY) IVPB 1500 mg/300 mL  Status:    Discontinued     1,500 mg 150 mL/hr over 120 Minutes Intravenous  Once 02/26/19 0244 02/26/19 0247      Subjective/Interval History: Patient complains of pain in his mouth.  Asking for more pain medications.  Denies taking any pain medications at home.  Shortness of breath has improved.  Denies any chest pain.  No nausea vomiting.  Had a large bowel movement earlier today.  Has been making urine.   Assessment/Plan:  Acute Hypoxic Resp. Failure/Pneumonia due to COVID-19/aspiration pneumonia Patient's x-ray did show bilateral airspace disease.  This was thought to be  secondary to COVID-19 along with aspiration.  Patient had to be intubated.  Due to significantly elevated LFTs patient was not given remdesivir.  Due to concern for bacterial infection patient was treated with antibacterials including cefepime and vancomycin.  It appears that he has completed course of same.  From a COVID-19 standpoint patient remained stable.  He is currently saturating normal on room air.  He is not getting any COVID-19 specific treatments at this time.  Continue to monitor clinically for now.  Continue to encourage to use incentive spirometry flutter valve.  Mobilize as much as possible.  PT and OT evaluation ongoing.  Acute kidney injury Secondary to rhabdomyolysis.  He was on CRRT.  Off of CRRT since 1/17.  He is making urine.  Nephrology is following.  Creatinine is stable today compared to yesterday.  Further plans per nephrology.  Foley catheter in place.  Oral ulcers Likely due to acute viral infection.  There also sores on his lips.  Patient started on Valtrex by PCCM which will be continued for now.  He is unable to swallow due to significant mouth pain.  He is getting mouthwash as well.  Patient was told that this will take some time for recovery.  Dysphagia This is a result of his mouth sores.  Core track feeding tube was discussed with him.  Initially he was very reluctant.  After further discussions by nutrition and nursing patient seems to be agreeable.  This has been placed.  Continue tube feedings for now.  Acute metabolic encephalopathy This was a multifactorial in the setting of DKA, renal failure, COVID-19, alcohol withdrawal.  Seems to have improved.  Continue Librium taper.  Continue with thiamine and folic acid.  Diabetes mellitus type 1, uncontrolled with hyperglycemia/DKA on presentation DKA resolved.  Continue SSI Lantus.  Monitor CBGs.  HbA1c 12.2.  Demand ischemia Patient did have elevated troponin levels.  This was thought to be secondary to demand  ischemia from acute illness.  EKG did not show any ischemic changes.  Nonspecific changes were noted.  Echocardiogram was done which does not show any wall motion abnormality.  Normal LV function.  Aspirin was added which will be continued.  He will benefit from seeing cardiology in the outpatient setting.  Acute alcoholic hepatitis LFTs have improved.  Continue to monitor periodically.  Alcohol withdrawal syndrome On Librium protocol.  Seems to be stable.  Thrombocytopenia and normocytic anemia Low platelet counts most likely alcohol-related along with acute infection.  Seems to be improving.  No evidence of overt bleeding.  Low hemoglobin also noted.  No overt bleeding noted.  Likely anemia of critical illness.  Continue to monitor.  Transfuse if it drops below 7.  Pressure injury, present on admission Pressure Injury 02/26/19 Sacrum Right;Left;Medial Deep Tissue Pressure Injury - Purple or maroon localized area of discolored intact skin or blood-filled blister due to damage of underlying soft tissue  from pressure and/or shear. 1/14, wound consult, low (Active)  02/26/19 1200  Location: Sacrum  Location Orientation: Right;Left;Medial  Staging: Deep Tissue Pressure Injury - Purple or maroon localized area of discolored intact skin or blood-filled blister due to damage of underlying soft tissue from pressure and/or shear.  Wound Description (Comments): 1/14, wound consult, low air loss mattress applied  Present on Admission: Yes     Pressure Injury 03/02/19 Lip Medial;Upper Deep Tissue Pressure Injury - Purple or maroon localized area of discolored intact skin or blood-filled blister due to damage of underlying soft tissue from pressure and/or shear. (Active)  03/02/19 1930  Location: Lip  Location Orientation: Medial;Upper  Staging: Deep Tissue Pressure Injury - Purple or maroon localized area of discolored intact skin or blood-filled blister due to damage of underlying soft tissue from  pressure and/or shear.  Wound Description (Comments):   Present on Admission: No     Pressure Injury 03/02/19 Ear Right Stage 2 -  Partial thickness loss of dermis presenting as a shallow open injury with a red, pink wound bed without slough. (Active)  03/02/19 1930  Location: Ear  Location Orientation: Right  Staging: Stage 2 -  Partial thickness loss of dermis presenting as a shallow open injury with a red, pink wound bed without slough.  Wound Description (Comments):   Present on Admission: No     Pressure Injury 03/02/19 Buttocks Right;Left Deep Tissue Pressure Injury - Purple or maroon localized area of discolored intact skin or blood-filled blister due to damage of underlying soft tissue from pressure and/or shear. (Active)  03/02/19 1930  Location: Buttocks  Location Orientation: Right;Left  Staging: Deep Tissue Pressure Injury - Purple or maroon localized area of discolored intact skin or blood-filled blister due to damage of underlying soft tissue from pressure and/or shear.  Wound Description (Comments):   Present on Admission:     DVT Prophylaxis: No heparin products due to thrombocytopenia.  SCDs. Code Status: Full code Family Communication: We will discuss with family Disposition Plan: Remain in ICU till we know further plans from nephrology.  If no plan for hemodialysis then he can be transferred to the floor later today.   Medications:  Scheduled: . chlordiazePOXIDE  25 mg Oral TID   Followed by  . chlordiazePOXIDE  25 mg Oral BH-qamhs   Followed by  . [START ON 03/08/2019] chlordiazePOXIDE  25 mg Oral Daily  . chlorhexidine gluconate (MEDLINE KIT)  15 mL Mouth Rinse BID  . Chlorhexidine Gluconate Cloth  6 each Topical Daily  . darbepoetin (ARANESP) injection - NON-DIALYSIS  40 mcg Subcutaneous Q Tue-1800  . feeding supplement (PRO-STAT SUGAR FREE 64)  30 mL Per Tube BID  . folic acid  1 mg Oral Daily  . influenza vac split quadrivalent PF  0.5 mL Intramuscular  Tomorrow-1000  . insulin aspart  0-9 Units Subcutaneous Q4H  . insulin glargine  5 Units Subcutaneous Daily  . lactulose  30 g Oral Daily  . magic mouthwash w/lidocaine  10 mL Oral TID  . mouth rinse  15 mL Mouth Rinse 10 times per day  . thiamine  100 mg Oral Daily  . valACYclovir  500 mg Oral BID   Continuous: . sodium chloride 75 mL/hr at 03/07/19 0500  . dexmedetomidine (PRECEDEX) IV infusion Stopped (03/05/19 1149)  . feeding supplement (OSMOLITE 1.5 CAL)    . norepinephrine (LEVOPHED) Adult infusion Stopped (03/05/19 1145)   WSF:KCLEXNTZGYFVC, chlordiazePOXIDE, dextrose, docusate, fentaNYL (SUBLIMAZE) injection, guaiFENesin, heparin, hydrOXYzine, lip balm,  loperamide, ondansetron (ZOFRAN) IV, ondansetron   Objective:  Vital Signs  Vitals:   03/07/19 1115 03/07/19 1130 03/07/19 1145 03/07/19 1200  BP:  119/83    Pulse: 79 89 60   Resp: 20 (!) 30 (!) 26   Temp:    98.8 F (37.1 C)  TempSrc:    Oral  SpO2: 96% 92% 97%   Weight:      Height:        Intake/Output Summary (Last 24 hours) at 03/07/2019 1321 Last data filed at 03/07/2019 1200 Gross per 24 hour  Intake 1382.67 ml  Output 1950 ml  Net -567.33 ml   Filed Weights   03/05/19 0500 03/06/19 0500 03/07/19 0500  Weight: 97.9 kg 97 kg 94.2 kg    General appearance: Awake alert.  In no distress Sores noted on his lips.  Few sores inside his oral cavity as well. Resp: Mildly tachypneic at rest.  Coarse breath sounds with crackles at the bases.  No wheezing or rhonchi. Cardio: S1-S2 is normal regular.  No S3-S4.  No rubs murmurs or bruit GI: Abdomen is soft.  Nontender nondistended.  Bowel sounds are present normal.  No masses organomegaly Extremities:   Moving all his extremities.  Mild edema noted. Neurologic: Alert and oriented x3.  No focal neurological deficits.    Lab Results:  Data Reviewed: I have personally reviewed following labs and imaging studies  CBC: Recent Labs  Lab 03/01/19 0401 03/01/19  0401 03/01/19 0533 03/01/19 1450 03/02/19 0400 03/03/19 0515 03/04/19 0525 03/05/19 0520  WBC 16.1*  --   --   --  9.0 7.9 6.1 9.5  NEUTROABS 14.8*  --   --   --  7.2 5.5  --   --   HGB 9.9*   < > 8.5*  --  9.8* 8.5* 8.3* 7.7*  HCT 27.2*   < > 25.0*  --  26.4* 24.1* 22.7* 21.8*  MCV 82.4  --   --   --  84.9 83.4 86.3 84.5  PLT 18*   < >  --  17*  17* 17* 28* 26* 65*   < > = values in this interval not displayed.    Basic Metabolic Panel: Recent Labs  Lab 03/02/19 2202 03/02/19 2202 03/03/19 0515 03/03/19 0515 03/03/19 1615 03/04/19 0525 03/05/19 0520 03/06/19 0510 03/07/19 0454  NA 140   < > 140   < > 140 139 140 143 144  K 4.4   < > 4.3   < > 3.8 4.0 4.0 3.9 3.9  CL 104   < > 105   < > 106 104 107 109 110  CO2 21*   < > 25   < > '27 27 25 24 23  '$ GLUCOSE 238*   < > 224*   < > 163* 121* 148* 151* 121*  BUN 88*   < > 83*   < > 69* 50* 50* 54* 60*  CREATININE 5.54*   < > 4.88*   < > 4.02* 3.17* 3.53* 4.63* 4.63*  CALCIUM 7.1*   < > 7.1*   < > 7.1* 7.1* 7.0* 7.3* 7.3*  MG 1.6*  --  1.9  --   --  2.0  --   --   --   PHOS  --   --  2.0*   < > 1.3* 1.5* 3.5 3.6 4.0   < > = values in this interval not displayed.    GFR: Estimated Creatinine Clearance: 24.7 mL/min (  A) (by C-G formula based on SCr of 4.63 mg/dL (H)).  Liver Function Tests: Recent Labs  Lab 03/03/19 0515 03/03/19 0515 03/03/19 1615 03/04/19 0525 03/05/19 0520 03/06/19 0510 03/07/19 0454  AST 94*  --   --  98* 101* 57* 44*  ALT 93*  --   --  87* 109* 87* 72*  ALKPHOS 335*  --   --  245* 279* 295* 278*  BILITOT 0.9  --   --  1.4* 1.3* 0.9 1.0  PROT 4.7*  --   --  4.4* 4.7* 5.0* 5.2*  ALBUMIN 2.1*  2.1*   < > 2.1* 1.9*  2.0* 1.9*  2.0* 2.0*  2.0* 2.0*  2.1*   < > = values in this interval not displayed.     Recent Labs  Lab 03/02/19 0400 03/04/19 0525  AMMONIA 98* 25    Coagulation Profile: Recent Labs  Lab 03/01/19 1450  INR 1.5*    Cardiac Enzymes: Recent Labs  Lab 03/01/19  1330  CKTOTAL 1,720*    CBG: Recent Labs  Lab 03/06/19 1210 03/06/19 1713 03/06/19 1948 03/07/19 0001 03/07/19 0319  GLUCAP 132* 113* 106* 104* 112*     Recent Results (from the past 240 hour(s))  Urine culture     Status: None   Collection Time: 02/26/19 12:38 AM   Specimen: Urine, Random  Result Value Ref Range Status   Specimen Description   Final    URINE, RANDOM Performed at [facility], [address], [place], [place] [address]    Special Requests   Final    Normal Performed at [facility], [address] [place], [place] [address]    Culture   Final    NO GROWTH Performed at [facility], [place] [address], [place], [place] [address]    Report Status 02/27/2019 FINAL  Final  Blood Culture (routine x 2)     Status: None   Collection Time: 02/26/19  1:51 AM   Specimen: BLOOD RIGHT ARM  Result Value Ref Range Status   Specimen Description BLOOD RIGHT ARM  Final   Special Requests   Final    BOTTLES DRAWN AEROBIC ONLY Blood Culture results may not be optimal due to an inadequate volume of blood received in culture bottles   Culture   Final    NO GROWTH 5 DAYS Performed at [facility], [address], [place], [place] [address]    Report Status 03/03/2019 FINAL  Final  Blood Culture (routine x 2)     Status: None   Collection Time: 02/26/19  1:55 AM   Specimen: BLOOD RIGHT WRIST  Result Value Ref Range Status   Specimen Description BLOOD RIGHT WRIST  Final   Special Requests   Final    BOTTLES DRAWN AEROBIC ONLY Blood Culture adequate volume   Culture   Final    NO GROWTH 5 DAYS Performed at [facility], [address], [place], [place] [address]    Report Status 03/03/2019 FINAL  Final  MRSA PCR Screening     Status: None   Collection Time: 02/27/19  5:00 PM   Specimen: Nasopharyngeal  Result Value Ref Range Status   MRSA by PCR NEGATIVE NEGATIVE Final    Comment:        The GeneXpert MRSA Assay (FDA approved for NASAL specimens only), is one  component of a comprehensive MRSA colonization surveillance program. It is not intended to diagnose MRSA infection nor to guide or monitor treatment for  MRSA infections. Performed at [facility], [place] [address], [place], [place] [address]       Radiology Studies: DG CHEST PORT 1 VIEW  Result Date: 03/05/2019 CLINICAL DATA:  Pneumonia, COVID-19, type II diabetes mellitus at, smoker, ethanol abuse EXAM: PORTABLE CHEST 1 VIEW COMPARISON:  Portable exam 1401 hours compared to 03/02/2019 FINDINGS: Interval removal of endotracheal and nasogastric tubes. RIGHT jugular central venous catheter with tip projecting over SVC. Normal heart size mediastinal contours. Patchy BILATERAL pulmonary infiltrates consistent with pneumonia, slightly increased in LEFT upper lobe since previous study. No pleural effusion or pneumothorax. IMPRESSION: BILATERAL pulmonary infiltrates consistent with multifocal pneumonia, slightly increased in LEFT upper lobe since 03/02/2019. Electronically Signed   By: Lavonia Dana M.D.   On: 03/05/2019 14:22       LOS: 9 days   Ercel Pepitone [employer] on [email or web address]  03/07/2019, 1:21 PM

## 2019-03-07 NOTE — Progress Notes (Signed)
Patient's father called and was updated of pt's condition. All questions were answered.

## 2019-03-07 NOTE — Progress Notes (Signed)
Pt's FSBS 65 mg/dL. Orange juice given and pt is sipping on thickened vanilla shake at this time. Will reassess FSBS in 15 minutes.

## 2019-03-07 NOTE — Procedures (Signed)
Cortrak  Person Inserting Tube:  Kendell Bane C, RD Tube Type:  Cortrak - 43 inches Tube Location:  Left nare Initial Placement:  Stomach Secured by: Bridle Technique Used to Measure Tube Placement:  Documented cm marking at nare/ corner of mouth Cortrak Secured At:  77 cm    Cortrak Tube Team Note:  Consult received to place a Cortrak feeding tube.  Explained procedure to patient who agrees with placement.   No x-ray is required. RN may begin using tube.   If the tube becomes dislodged please keep the tube and contact the Cortrak team at [email or web address] ([email or web address]) for replacement.  If after hours and replacement cannot be delayed, place a NG tube and confirm placement with an abdominal x-ray.    Kendell Bane RD, LDN, CNSC [phone] Pager [phone] After Hours Pager

## 2019-03-07 NOTE — Progress Notes (Signed)
  Speech Language Pathology Treatment: Dysphagia  Patient Details Name: Shawn Watkins MRN: [id number] DOB: 07-Dec-1980 Today's Date: 03/07/2019 Time: 1103-1140 SLP Time Calculation (min) (ACUTE ONLY): 37 min  Assessment / Plan / Recommendation Clinical Impression  Pt seen for dysphagia treatment while sitting on edge of bed with PT. Lips appropriately scabbed after cleaning yesterday- provided moisture but letting scabs heal. Oral cavity moist and mostly pink except for diffuse candidia. Oral control and containment mildly improved with trials thin and pudding but anterior spill intermittently continues. Small sips via cup were coordinated and initiated without cough, throat clear or wet vocal quality. When taking a larger sip he exhibited multiple swallow, eyes widened- verbal reminders for smaller single sips. Functional transit with pudding and mild labial residue and anterior leak when mixed with saliva. He would be inefficient with solids at present and did not attempt. Recommend thin liquids (pt prefers no straws) and full liquid diet and continued ST for progression and safety. Pt needs pills whole in pudding.   HPI HPI: 39 y.o. male admitted on 02/26/19 for for DKA, ETOH 215, COVID-19.  PMH: alcoholism (per father 10 years), depression, alcoholic hepatitis. Was dx 02/18/19 with COVID, however, left [facility] ED AMA. Intubated 02/26/19-03/04/19 and transferred to [facility]. Started on CRRT on 03/02/19, hypotensive and bradycardic.        SLP Plan  Continue with current plan of care       Recommendations  Diet recommendations: Thin liquid;Other(comment)(full liquids) Liquids provided via: Cup Medication Administration: Whole meds with puree Supervision: Patient able to self feed;Full supervision/cueing for compensatory strategies;Staff to assist with self feeding Compensations: Slow rate;Small sips/bites Postural Changes and/or Swallow Maneuvers: Seated upright 90 degrees                Oral  Care Recommendations: Oral care QID Follow up Recommendations: Inpatient Rehab SLP Visit Diagnosis: Dysphagia, unspecified (R13.10) Plan: Continue with current plan of care       GO                Royce Macadamia 03/07/2019, 12:55 PM

## 2019-03-07 NOTE — Progress Notes (Signed)
Nutrition Follow-up  RD working remotely.  DOCUMENTATION CODES:   Not applicable  INTERVENTION:   Start TF via Cortrak tube:   Osmolite 1.5 at 70 ml/h   Pro-stat 30 ml BID   Provides 2720 kcal, 135 gm protein daily  NUTRITION DIAGNOSIS:   Increased nutrient needs related to acute illness(COVID) as evidenced by estimated needs.  Ongoing   GOAL:   Patient will meet greater than or equal to 90% of their needs  Unmet  MONITOR:   Diet advancement, PO intake, Labs, TF tolerance, Skin  ASSESSMENT:   39 yo male admitted with AMS and increased WOB, DKA. Required intubation on admission. COVID positive on 1/3. PMH includes DM (on insulin at home), ETOH abuse, pancreatitis, depression, alcoholic hepatitis.  SLP following; NPO s/p bedside swallow evaluation yesterday. Hopeful to advance to full liquids today per RD discussion with SLP. Patient reported to RD that it hurts to talk. He has thrush and blisters around his mouth. He likes ice cream. Suspect intake will be very poor.   Cortrak placed this morning, tip is in the stomach. Received MD Consult for TF initiation and management.  Labs reviewed. BUN 60 (H), creatinine 4.63 (H) CBG's: [phone]  Medications reviewed and include decadron, folic acid, novolog, lantus, lactulose, thiamine.  Weight starting to trend back down. I/O +12.2 L since admission  Diet Order:   Diet Order            Diet full liquid Room service appropriate? Yes; Fluid consistency: Thin  Diet effective now              EDUCATION NEEDS:   Not appropriate for education at this time  Skin:  Skin Assessment: Skin Integrity Issues: Skin Integrity Issues:: DTI, Stage II DTI: sacrum, lip, buttocks Stage I: N/A Stage II: R ear  Last BM:  1/20 type 7  Height:   Ht Readings from Last 1 Encounters:  02/26/19 6\' 1"  (1.854 m)    Weight:   Wt Readings from Last 1 Encounters:  03/07/19 94.2 kg    Ideal Body Weight:  83.6 kg  BMI:   Body mass index is 27.4 kg/m.  Estimated Nutritional Needs:   Kcal:  2600-2900  Protein:  120-135 gm  Fluid:  >/= 2.4 L    03/09/19, RD, LDN, CNSC Pager [phone] After Hours Pager [phone]

## 2019-03-07 NOTE — Progress Notes (Signed)
Physical Therapy Treatment Patient Details Name: Shawn Watkins MRN: [id number] DOB: 08-Aug-1980 Today's Date: 03/07/2019    History of Present Illness 39 y.o. male admitted on 02/26/19 for for DKA, ETOH 215, COVID-19.  Was dx 02/18/19 with COVID, however, left [facility] ED AMA.  He was intubated at [facility] on 02/26/19 and transferred to [facility] for further care.  He was started on CRRT on 03/02/19, hypotensive and bradycardic.  Pt extubated 03/03/18. Pt with significant PMH of alcoholism (per father 10 years), DM2, depression, alcoholic hepatitis.      PT Comments    Pt is progressing well with his mobility, painful in his sacral area due to pressure wound.  He was able to stand with less assistance today and is on RA.  Co-session with SLP to get him fully upright for po trials (see SLP note for details).  He remains cognitively impaired, but improving.  He was a bit emotionally labile today.  He was able to stand with less assistance, but did not want to get OOB to chair due to pain in sitting yesterday in the chair.  Geo mat cushion request.    Follow Up Recommendations  CIR     Equipment Recommendations  3in1 (PT);Rolling walker with 5" wheels    Recommendations for Other Services   NA     Precautions / Restrictions Precautions Precautions: Fall Precaution Comments: pressure area buttocks--geo mat in room    Mobility  Bed Mobility Overal bed mobility: Needs Assistance Bed Mobility: Supine to Sit;Sit to Supine     Supine to sit: Min assist;HOB elevated Sit to supine: Mod assist;HOB elevated   General bed mobility comments: Min hand held assist to pull up to sitting from elevated HOB.   Transfers Overall transfer level: Needs assistance Equipment used: 1 person hand held assist Transfers: Sit to/from Stand Sit to Stand: Min assist;From elevated surface         General transfer comment: One person heavy min assist to stand from elevated bed, holding the bed rail and therapist's hand.  Pt  able to stand for 3 mins for pericare and sacral dressing change.  Pt did show signs of fatigue (flexing knees and hips towards the end of the stand).  VSS on RA during mobility.          Balance Overall balance assessment: Needs assistance Sitting-balance support: Feet supported;Bilateral upper extremity supported Sitting balance-Leahy Scale: Fair     Standing balance support: Bilateral upper extremity supported Standing balance-Leahy Scale: Poor Standing balance comment: needs external assist in standing.                             Cognition Arousal/Alertness: Awake/alert Behavior During Therapy: (labile) Overall Cognitive Status: Impaired/Different from baseline Area of Impairment: Orientation;Attention;Memory;Following commands;Safety/judgement;Awareness;Problem solving                 Orientation Level: Disoriented to;Time;Situation Current Attention Level: Sustained Memory: Decreased short-term memory Following Commands: Follows one step commands consistently Safety/Judgement: Decreased awareness of safety;Decreased awareness of deficits Awareness: Emergent Problem Solving: Slow processing;Requires verbal cues;Requires tactile cues General Comments: Cognition improving, however, very emotional today, needs redirection at times, better reasoning skills.       Exercises General Exercises - Lower Extremity Ankle Circles/Pumps: AROM;Both;20 reps Long Arc Quad: AROM;Both;10 reps Heel Slides: AROM;Both;10 reps;Supine Straight Leg Raises: AROM;Both;10 reps;Supine        Pertinent Vitals/Pain Pain Assessment: Faces Faces Pain Scale: Hurts even more  Pain Location: mouth, buttocks Pain Intervention(s): Limited activity within patient's tolerance;Monitored during session;Repositioned           PT Goals (current goals can now be found in the care plan section) Acute Rehab PT Goals Patient Stated Goal: to get better, stop hurting Progress towards PT  goals: Progressing toward goals    Frequency    Min 3X/week      PT Plan Current plan remains appropriate    Co-evaluation PT/OT/SLP Co-Evaluation/Treatment: Yes Reason for Co-Treatment: Complexity of the patient's impairments (multi-system involvement);Necessary to address cognition/behavior during functional activity(to have pt fully upright for po trials) PT goals addressed during session: Mobility/safety with mobility;Balance;Strengthening/ROM        AM-PAC PT "6 Clicks" Mobility   Outcome Measure  Help needed turning from your back to your side while in a flat bed without using bedrails?: A Little Help needed moving from lying on your back to sitting on the side of a flat bed without using bedrails?: A Little Help needed moving to and from a bed to a chair (including a wheelchair)?: A Lot Help needed standing up from a chair using your arms (e.g., wheelchair or bedside chair)?: A Little Help needed to walk in hospital room?: A Lot Help needed climbing 3-5 steps with a railing? : Total 6 Click Score: 14    End of Session   Activity Tolerance: Patient limited by fatigue;Patient limited by pain Patient left: in bed;with call bell/phone within reach;with nursing/sitter in room   PT Visit Diagnosis: Muscle weakness (generalized) (M62.81);Difficulty in walking, not elsewhere classified (R26.2)     Time: 1051-1140 PT Time Calculation (min) (ACUTE ONLY): 49 min  Charges:  $Therapeutic Activity: 23-37 mins                    Verdene Lennert, PT, DPT  Acute Rehabilitation [phone] pager #[phone] office  @ [facility]: [phone]   03/07/2019, 2:00 PM

## 2019-03-07 NOTE — Progress Notes (Signed)
[facility] Progress Note   Home meds: - insulin glargine 15u hs/ insulin lispro 5 tid sq  Assessment/ Plan:   1. AKI - in setting of DKA and COVID infection. Shock is likely cause of ATN, oliguric. CPK was up slightly 1750 but not responsible for AKI. CRRT  1/15-1/17. -off pressors, awake  and extubated - UOP 1200/800/1150/1900 past 4 days>will hold off on restartingRRT and see if renal fxnwill startRecovering. For 1st time in days creatinine has not increased; hopefully cr will start to decr tomorrow.  Will f/u renal function in the AM. - If there is an improving trend to the renal function then can remove the temp cath.  2. COVID + infection- per CCM/ triad 3. Thrombocytopenia -still lowbut improving, smear neg for schistocytes. Suspected DIC. 4. Shock - improving, pressorsoff 5. DKA - per CCM 6. Alcoholic hepatitis 7. Vol excess -improving 8. Anemia -  pharm for ESA dosing.  Subjective:   Generalized body aches and sore throat. Main complain is his mouth.   Objective:   BP 119/83 (BP Location: Left Arm)   Pulse 60   Temp 98.8 F (37.1 C) (Oral)   Resp (!) 26   Ht '6\' 1"'$  (1.854 m)   Wt 94.2 kg   SpO2 97%   BMI 27.40 kg/m   Intake/Output Summary (Last 24 hours) at 03/07/2019 1419 Last data filed at 03/07/2019 1200 Gross per 24 hour  Intake 1307.72 ml  Output 1850 ml  Net -542.28 ml   Weight change: -2.8 kg  Physical Exam: RIJ temp cath for crrt Awake, off the vent Chest clear ant/ lat Cor reg  Abd no ascites Ext diffuse tr  edema  Imaging: DG CHEST PORT 1 VIEW  Result Date: 03/05/2019 CLINICAL DATA:  Pneumonia, COVID-19, type II diabetes mellitus at, smoker, ethanol abuse EXAM: PORTABLE CHEST 1 VIEW COMPARISON:  Portable exam 1401 hours compared to 03/02/2019 FINDINGS: Interval removal of endotracheal and nasogastric tubes. RIGHT jugular central venous catheter with tip projecting over SVC. Normal heart size  mediastinal contours. Patchy BILATERAL pulmonary infiltrates consistent with pneumonia, slightly increased in LEFT upper lobe since previous study. No pleural effusion or pneumothorax. IMPRESSION: BILATERAL pulmonary infiltrates consistent with multifocal pneumonia, slightly increased in LEFT upper lobe since 03/02/2019. Electronically Signed   By: Lavonia Dana M.D.   On: 03/05/2019 14:22    Labs: BMET Recent Labs  Lab 03/02/19 2202 03/03/19 0515 03/03/19 1615 03/04/19 0525 03/05/19 0520 03/06/19 0510 03/07/19 0454  NA 140 140 140 139 140 143 144  K 4.4 4.3 3.8 4.0 4.0 3.9 3.9  CL 104 105 106 104 107 109 110  CO2 21* '25 27 27 25 24 23  '$ GLUCOSE 238* 224* 163* 121* 148* 151* 121*  BUN 88* 83* 69* 50* 50* 54* 60*  CREATININE 5.54* 4.88* 4.02* 3.17* 3.53* 4.63* 4.63*  CALCIUM 7.1* 7.1* 7.1* 7.1* 7.0* 7.3* 7.3*  PHOS  --  2.0* 1.3* 1.5* 3.5 3.6 4.0   CBC Recent Labs  Lab 03/01/19 0401 03/01/19 0533 03/02/19 0400 03/03/19 0515 03/04/19 0525 03/05/19 0520  WBC 16.1*   < > 9.0 7.9 6.1 9.5  NEUTROABS 14.8*  --  7.2 5.5  --   --   HGB 9.9*   < > 9.8* 8.5* 8.3* 7.7*  HCT 27.2*   < > 26.4* 24.1* 22.7* 21.8*  MCV 82.4   < > 84.9 83.4 86.3 84.5  PLT 18*   < > 17* 28* 26* 65*   < > =  values in this interval not displayed.    Medications:    . chlordiazePOXIDE  25 mg Oral TID   Followed by  . chlordiazePOXIDE  25 mg Oral BH-qamhs   Followed by  . [START ON 03/08/2019] chlordiazePOXIDE  25 mg Oral Daily  . chlorhexidine gluconate (MEDLINE KIT)  15 mL Mouth Rinse BID  . Chlorhexidine Gluconate Cloth  6 each Topical Daily  . darbepoetin (ARANESP) injection - NON-DIALYSIS  40 mcg Subcutaneous Q Tue-1800  . feeding supplement (PRO-STAT SUGAR FREE 64)  30 mL Per Tube BID  . folic acid  1 mg Oral Daily  . influenza vac split quadrivalent PF  0.5 mL Intramuscular Tomorrow-1000  . insulin aspart  0-9 Units Subcutaneous Q4H  . insulin glargine  5 Units Subcutaneous Daily  . lactulose  30  g Oral Daily  . magic mouthwash w/lidocaine  10 mL Oral TID  . mouth rinse  15 mL Mouth Rinse 10 times per day  . thiamine  100 mg Oral Daily  . valACYclovir  500 mg Oral BID      Otelia Santee, MD 03/07/2019, 2:19 PM

## 2019-03-08 LAB — CBC
HCT: 22.3 % — ABNORMAL LOW (ref 39.0–52.0)
Hemoglobin: 7.5 g/dL — ABNORMAL LOW (ref 13.0–17.0)
MCH: 29 pg (ref 26.0–34.0)
MCHC: 33.6 g/dL (ref 30.0–36.0)
MCV: 86.1 fL (ref 80.0–100.0)
Platelets: 120 10*3/uL — ABNORMAL LOW (ref 150–400)
RBC: 2.59 MIL/uL — ABNORMAL LOW (ref 4.22–5.81)
RDW: 14.8 % (ref 11.5–15.5)
WBC: 8.9 10*3/uL (ref 4.0–10.5)
nRBC: 0 % (ref 0.0–0.2)

## 2019-03-08 LAB — COMPREHENSIVE METABOLIC PANEL
ALT: 57 U/L — ABNORMAL HIGH (ref 0–44)
AST: 31 U/L (ref 15–41)
Albumin: 2 g/dL — ABNORMAL LOW (ref 3.5–5.0)
Alkaline Phosphatase: 247 U/L — ABNORMAL HIGH (ref 38–126)
Anion gap: 9 (ref 5–15)
BUN: 62 mg/dL — ABNORMAL HIGH (ref 6–20)
CO2: 23 mmol/L (ref 22–32)
Calcium: 7.3 mg/dL — ABNORMAL LOW (ref 8.9–10.3)
Chloride: 113 mmol/L — ABNORMAL HIGH (ref 98–111)
Creatinine, Ser: 4.1 mg/dL — ABNORMAL HIGH (ref 0.61–1.24)
GFR calc Af Amer: 20 mL/min — ABNORMAL LOW (ref >60)
GFR calc non Af Amer: 17 mL/min — ABNORMAL LOW (ref >60)
Glucose, Bld: 222 mg/dL — ABNORMAL HIGH (ref 70–99)
Potassium: 3.8 mmol/L (ref 3.5–5.1)
Sodium: 145 mmol/L (ref 135–145)
Total Bilirubin: 0.7 mg/dL (ref 0.3–1.2)
Total Protein: 5.1 g/dL — ABNORMAL LOW (ref 6.5–8.1)

## 2019-03-08 LAB — GLUCOSE, CAPILLARY: Glucose-Capillary: 218 mg/dL — ABNORMAL HIGH (ref 70–99)

## 2019-03-08 MED ORDER — OXYCODONE HCL 5 MG PO TABS
5.0000 mg | ORAL_TABLET | Freq: Four times a day (QID) | ORAL | Status: DC | PRN
  Administered 2019-03-08 – 2019-03-09 (×4): 5 mg via ORAL
  Filled 2019-03-08 (×4): qty 1

## 2019-03-08 NOTE — Plan of Care (Signed)
  Problem: Education: Goal: Knowledge of risk factors and measures for prevention of condition will improve Outcome: Progressing   Problem: Coping: Goal: Psychosocial and spiritual needs will be supported Outcome: Progressing   Problem: Respiratory: Goal: Will maintain a patent airway Outcome: Progressing Goal: Complications related to the disease process, condition or treatment will be avoided or minimized Outcome: Progressing   Problem: Education: Goal: Ability to describe self-care measures that may prevent or decrease complications (Diabetes Survival Skills Education) will improve Outcome: Progressing Goal: Individualized Educational Video(s) Outcome: Progressing   Problem: Fluid Volume: Goal: Ability to maintain a balanced intake and output will improve Outcome: Progressing   Problem: Metabolic: Goal: Ability to maintain appropriate glucose levels will improve Outcome: Progressing   Problem: Nutritional: Goal: Maintenance of adequate nutrition will improve Outcome: Progressing Goal: Progress toward achieving an optimal weight will improve Outcome: Progressing   Problem: Skin Integrity: Goal: Risk for impaired skin integrity will decrease Outcome: Progressing   Problem: Tissue Perfusion: Goal: Adequacy of tissue perfusion will improve Outcome: Progressing

## 2019-03-08 NOTE — Progress Notes (Signed)
Inpatient Rehabilitation-Admissions Coordinator   Attempted to reach out to patient through RN assistance. Per RN, the patient does not want to talk at this time and will not give me permission to speak with his father either.   AC will follow along and will continue attempts to speak to patient on another day.   Cheri Rous, OTR/L  Rehab Admissions Coordinator  [phone] 03/08/2019 1:08 PM

## 2019-03-08 NOTE — Plan of Care (Signed)
  Problem: Education: Goal: Knowledge of risk factors and measures for prevention of condition will improve Outcome: Progressing   Problem: Coping: Goal: Psychosocial and spiritual needs will be supported Outcome: Progressing   Problem: Respiratory: Goal: Will maintain a patent airway Outcome: Progressing Goal: Complications related to the disease process, condition or treatment will be avoided or minimized Outcome: Progressing   

## 2019-03-08 NOTE — Progress Notes (Addendum)
 PROGRESS NOTE  Shawn Watkins MRN:[id number] DOB: 02/27/1980 DOA: 02/26/2019  PCP: Patient, No Pcp Per  Brief History/Interval Summary: 38 y/o male with a past medical history of type 1 diabetes, alcohol abuse, history of pancreatitis, depression, seizures, seen 1/3 with nausea, vomiting, sore throat and body aches.  Diagnosed with COVID, then left for home AMA.  Returned 1/11 to [facility], in DKA, intubated for delirium and transferred to [facility]. Course complicated by AKI and persistent hypotension.  Reason for Visit: Acute renal failure.  Diabetic ketoacidosis  Consultants: Pulmonology.  Nephrology  Procedures:   Transthoracic echocardiogram 03/04/2019  1. Left ventricular ejection fraction, by visual estimation, is 60 to 65%. The left ventricle has normal function. There is no increased left ventricular wall thickness.  2. Global right ventricle has normal systolc function.The right ventricular size is normal. no increase in right ventricular wall thickness.  3. The mitral valve is normal in structure. Trivial mitral valve regurgitation. No evidence of mitral stenosis.  4. The tricuspid valve was normal in structure. Tricuspid valve regurgitation is mild.  5. Tricuspid valve regurgitation is mild.  6. No evidence of aortic valve sclerosis or stenosis.  7. The inferior vena cava is normal in size with greater than 50% respiratory variability, suggesting right atrial pressure of 3 mmHg.  Antibiotics: Anti-infectives (From admission, onward)   Start     Dose/Rate Route Frequency Ordered Stop   03/06/19 0500  valACYclovir (VALTREX) tablet 500 mg     500 mg Oral 2 times daily 03/06/19 0344     03/03/19 0000  ceFEPIme (MAXIPIME) 2 g in sodium chloride 0.9 % 100 mL IVPB     2 g 200 mL/hr over 30 Minutes Intravenous Every 12 hours 03/02/19 1552 03/04/19 1415   02/28/19 1200  ceFEPIme (MAXIPIME) 2 g in sodium chloride 0.9 % 100 mL IVPB  Status:  Discontinued     2 g 200 mL/hr over 30 Minutes  Intravenous Every 24 hours 02/27/19 1539 03/02/19 1552   02/26/19 1400  ceFEPIme (MAXIPIME) 2 g in sodium chloride 0.9 % 100 mL IVPB  Status:  Discontinued     2 g 200 mL/hr over 30 Minutes Intravenous Every 12 hours 02/26/19 0912 02/27/19 1539   02/26/19 1200  ceFEPIme (MAXIPIME) 2 g in sodium chloride 0.9 % 100 mL IVPB  Status:  Discontinued     2 g 200 mL/hr over 30 Minutes Intravenous Every 8 hours 02/26/19 0249 02/26/19 0912   02/26/19 1032  vancomycin variable dose per unstable renal function (pharmacist dosing)  Status:  Discontinued      Does not apply See admin instructions 02/26/19 1032 02/28/19 1235   02/26/19 0300  vancomycin (VANCOREADY) IVPB 1750 mg/350 mL  Status:  Discontinued     1,750 mg 175 mL/hr over 120 Minutes Intravenous Every 12 hours 02/26/19 0247 02/26/19 1032   02/26/19 0245  ceFEPIme (MAXIPIME) 2 g in sodium chloride 0.9 % 100 mL IVPB     2 g 200 mL/hr over 30 Minutes Intravenous  Once 02/26/19 0240 02/26/19 0350   02/26/19 0245  metroNIDAZOLE (FLAGYL) IVPB 500 mg     500 mg 100 mL/hr over 60 Minutes Intravenous  Once 02/26/19 0240 02/26/19 0452   02/26/19 0245  vancomycin (VANCOCIN) IVPB 1000 mg/200 mL premix  Status:  Discontinued     1,000 mg 200 mL/hr over 60 Minutes Intravenous  Once 02/26/19 0240 02/26/19 0244   02/26/19 0245  vancomycin (VANCOREADY) IVPB 1500 mg/300 mL  Status:    Discontinued     1,500 mg 150 mL/hr over 120 Minutes Intravenous  Once 02/26/19 0244 02/26/19 0247      Subjective/Interval History: Patient continues to complain of pain in his mouth.  He continues to ask for more pain medications.  He was told about the potential side effect of narcotics.  He was told that he needs to try taking the medicines by mouth for a longer effect.  Otherwise he feels well.  Denies any shortness of breath.  He is happy that his kidneys are getting better.     Assessment/Plan:  Acute Hypoxic Resp. Failure/Pneumonia due to COVID-19/aspiration  pneumonia Patient's x-ray did show bilateral airspace disease.  This was thought to be secondary to COVID-19 along with aspiration.  Patient had to be intubated.  Due to significantly elevated LFTs patient was not given remdesivir.  Due to concern for bacterial infection patient was treated with antibacterials including cefepime and vancomycin.   From a COVID-19 standpoint patient is stable.  He saturating normal on room air.  Continue with incentive spirometry, mobilization.  PT and OT.    Acute kidney injury Secondary to rhabdomyolysis.  He was on CRRT.  Off of CRRT since 1/17.  He is making urine.  His creatinine is improved today to 4.1.  Hopefully he will not require any further CRRT or hemodialysis.  Waiting on nephrology input.  Hopefully we will be able to discontinue his dialysis catheter and Foley catheter later today.  And then he can be transferred to the floor.  Will defer IV fluids to nephrology as well.  ADDENDUM Spoke to Dr. Lin with nephrology.  Reviewed labs with him.  Reviewed urine output.  He recommends taking out the dialysis catheter.  We will also discontinue Foley.  Bladder scan every shift.  Okay for transfer to floor.  Oral ulcers Likely due to acute viral infection.  There also sores on his lips.  Patient started on Valtrex by PCCM which will be continued for now.  He is unable to swallow due to significant mouth pain.  He is getting mouthwash as well.  Patient was told that this will take some time for recovery.  Patient requesting pain medications.  He is already getting fentanyl.  He will be placed on oral oxycodone which will provide relief for a longer duration.  Dysphagia This is a result of his mouth sores.  Core track feeding tube was discussed with him.  Initially he was very reluctant.  After further discussions by nutrition and nursing patient agreed.  Feeding tube was placed on 1/20.  Continue tube feedings.  ADDENDUM: Patient adamant about removing feeding tube  despite explanation of benefits of nutrition. He understands. Still insistent. Will discontinue.  Acute metabolic encephalopathy This was a multifactorial in the setting of DKA, renal failure, COVID-19, alcohol withdrawal.  Seems to have improved.  He was treated with Librium taper.  Continue with thiamine and folic acid.  Diabetes mellitus type 1, uncontrolled with hyperglycemia/DKA on presentation DKA resolved.  Continue SSI Lantus.  CBGs are reasonably well controlled.  One episode of hypoglycemia noted yesterday.  She had not recurred now that he is on tube feedings.  HbA1c 12.2.  Demand ischemia Patient did have elevated troponin levels.  This was thought to be secondary to demand ischemia from acute illness.  EKG did not show any ischemic changes.  Nonspecific changes were noted.  Echocardiogram was done which does not show any wall motion abnormality.  Normal LV function.  Aspirin   was added which will be continued.  He will benefit from seeing cardiology in the outpatient setting.  Acute alcoholic hepatitis LFTs have improved.  Continue to monitor periodically.  Alcohol withdrawal syndrome Stable.  No active withdrawal symptoms noted.  Still a little bit anxious.  He was treated with Librium protocol.  Thrombocytopenia and normocytic anemia Low platelet counts most likely alcohol-related along with acute infection.  Platelet counts improving.  Hemoglobin is low but stable.  No evidence of overt bleeding.  Transfuse if it drops below 7.   Pressure injury, present on admission Pressure Injury 02/26/19 Sacrum Right;Left;Medial Deep Tissue Pressure Injury - Purple or maroon localized area of discolored intact skin or blood-filled blister due to damage of underlying soft tissue from pressure and/or shear. 1/14, wound consult, low (Active)  02/26/19 1200  Location: Sacrum  Location Orientation: Right;Left;Medial  Staging: Deep Tissue Pressure Injury - Purple or maroon localized area of  discolored intact skin or blood-filled blister due to damage of underlying soft tissue from pressure and/or shear.  Wound Description (Comments): 1/14, wound consult, low air loss mattress applied  Present on Admission: Yes     Pressure Injury 03/02/19 Lip Medial;Upper Deep Tissue Pressure Injury - Purple or maroon localized area of discolored intact skin or blood-filled blister due to damage of underlying soft tissue from pressure and/or shear. (Active)  03/02/19 1930  Location: Lip  Location Orientation: Medial;Upper  Staging: Deep Tissue Pressure Injury - Purple or maroon localized area of discolored intact skin or blood-filled blister due to damage of underlying soft tissue from pressure and/or shear.  Wound Description (Comments):   Present on Admission: No     Pressure Injury 03/02/19 Ear Right Stage 2 -  Partial thickness loss of dermis presenting as a shallow open injury with a red, pink wound bed without slough. (Active)  03/02/19 1930  Location: Ear  Location Orientation: Right  Staging: Stage 2 -  Partial thickness loss of dermis presenting as a shallow open injury with a red, pink wound bed without slough.  Wound Description (Comments):   Present on Admission: No     Pressure Injury 03/02/19 Buttocks Right;Left Deep Tissue Pressure Injury - Purple or maroon localized area of discolored intact skin or blood-filled blister due to damage of underlying soft tissue from pressure and/or shear. (Active)  03/02/19 1930  Location: Buttocks  Location Orientation: Right;Left  Staging: Deep Tissue Pressure Injury - Purple or maroon localized area of discolored intact skin or blood-filled blister due to damage of underlying soft tissue from pressure and/or shear.  Wound Description (Comments):   Present on Admission:     DVT Prophylaxis: No heparin products due to thrombocytopenia.  SCDs. Code Status: Full code Family Communication: Will update family later today. Disposition Plan:  Hopefully can be transferred to the floor later today if no further plans for hemodialysis or CRRT.     Medications:  Scheduled: . chlordiazePOXIDE  25 mg Oral Daily  . chlorhexidine gluconate (MEDLINE KIT)  15 mL Mouth Rinse BID  . Chlorhexidine Gluconate Cloth  6 each Topical Daily  . darbepoetin (ARANESP) injection - NON-DIALYSIS  40 mcg Subcutaneous Q Tue-1800  . feeding supplement (PRO-STAT SUGAR FREE 64)  30 mL Per Tube BID  . folic acid  1 mg Oral Daily  . influenza vac split quadrivalent PF  0.5 mL Intramuscular Tomorrow-1000  . insulin aspart  0-9 Units Subcutaneous Q4H  . insulin glargine  5 Units Subcutaneous Daily  . lactulose  30 g   Oral Daily  . magic mouthwash w/lidocaine  10 mL Oral TID  . mouth rinse  15 mL Mouth Rinse 10 times per day  . thiamine  100 mg Oral Daily  . valACYclovir  500 mg Oral BID   Continuous: . sodium chloride 75 mL/hr at 03/08/19 0600  . dexmedetomidine (PRECEDEX) IV infusion Stopped (03/05/19 1149)  . feeding supplement (OSMOLITE 1.5 CAL) 50 mL/hr at 03/08/19 0602   PRN:acetaminophen, chlordiazePOXIDE, dextrose, docusate, fentaNYL (SUBLIMAZE) injection, guaiFENesin, heparin, hydrOXYzine, lip balm, loperamide, ondansetron (ZOFRAN) IV, ondansetron, oxyCODONE   Objective:  Vital Signs  Vitals:   03/08/19 0500 03/08/19 0600 03/08/19 0700 03/08/19 0730  BP: (!) 120/97 130/82 129/86 128/78  Pulse: 77 67 62 69  Resp: (!) 23 (!) 32 (!) 25 17  Temp: 98.6 F (37 C)   98.6 F (37 C)  TempSrc: Axillary   Oral  SpO2: 90% 96% 97% 97%  Weight:      Height:        Intake/Output Summary (Last 24 hours) at 03/08/2019 1021 Last data filed at 03/08/2019 0900 Gross per 24 hour  Intake 2612.75 ml  Output 3420 ml  Net -807.25 ml   Filed Weights   03/06/19 0500 03/07/19 0500 03/08/19 0332  Weight: 97 kg 94.2 kg 94 kg    General appearance: Awake alert.  In no distress Sores noted on his lips and in his oral cavity. Resp: Normal effort at rest.   Coarse breath sounds with a few crackles at the bases.  No wheezing or rhonchi. Cardio: S1-S2 is normal regular.  No S3-S4.  No rubs murmurs or bruit GI: Abdomen is soft.  Nontender nondistended.  Bowel sounds are present normal.  No masses organomegaly Extremities: No edema.  Full range of motion of lower extremities. Neurologic: Alert and oriented x3.  No focal neurological deficits.    Lab Results:  Data Reviewed: I have personally reviewed following labs and imaging studies  CBC: Recent Labs  Lab 03/02/19 0400 03/03/19 0515 03/04/19 0525 03/05/19 0520 03/08/19 0417  WBC 9.0 7.9 6.1 9.5 8.9  NEUTROABS 7.2 5.5  --   --   --   HGB 9.8* 8.5* 8.3* 7.7* 7.5*  HCT 26.4* 24.1* 22.7* 21.8* 22.3*  MCV 84.9 83.4 86.3 84.5 86.1  PLT 17* 28* 26* 65* 120*    Basic Metabolic Panel: Recent Labs  Lab 03/02/19 2202 03/02/19 2202 03/03/19 0515 03/03/19 0515 03/03/19 1615 03/03/19 1615 03/04/19 0525 03/05/19 0520 03/06/19 0510 03/07/19 0454 03/08/19 0417  NA 140   < > 140   < > 140   < > 139 140 143 144 145  K 4.4   < > 4.3   < > 3.8   < > 4.0 4.0 3.9 3.9 3.8  CL 104   < > 105   < > 106   < > 104 107 109 110 113*  CO2 21*   < > 25   < > 27   < > 27 25 24 23 23  GLUCOSE 238*   < > 224*   < > 163*   < > 121* 148* 151* 121* 222*  BUN 88*   < > 83*   < > 69*   < > 50* 50* 54* 60* 62*  CREATININE 5.54*   < > 4.88*   < > 4.02*   < > 3.17* 3.53* 4.63* 4.63* 4.10*  CALCIUM 7.1*   < > 7.1*   < > 7.1*   < >   7.1* 7.0* 7.3* 7.3* 7.3*  MG 1.6*  --  1.9  --   --   --  2.0  --   --   --   --   PHOS  --   --  2.0*   < > 1.3*  --  1.5* 3.5 3.6 4.0  --    < > = values in this interval not displayed.    GFR: Estimated Creatinine Clearance: 27.9 mL/min (A) (by C-G formula based on SCr of 4.1 mg/dL (H)).  Liver Function Tests: Recent Labs  Lab 03/04/19 0525 03/05/19 0520 03/06/19 0510 03/07/19 0454 03/08/19 0417  AST 98* 101* 57* 44* 31  ALT 87* 109* 87* 72* 57*  ALKPHOS 245* 279* 295*  278* 247*  BILITOT 1.4* 1.3* 0.9 1.0 0.7  PROT 4.4* 4.7* 5.0* 5.2* 5.1*  ALBUMIN 1.9*  2.0* 1.9*  2.0* 2.0*  2.0* 2.0*  2.1* 2.0*     Recent Labs  Lab 03/02/19 0400 03/04/19 0525  AMMONIA 98* 25    Coagulation Profile: Recent Labs  Lab 03/01/19 1450  INR 1.5*    Cardiac Enzymes: Recent Labs  Lab 03/01/19 1330  CKTOTAL 1,720*    CBG: Recent Labs  Lab 03/07/19 1545 03/07/19 1611 03/07/19 1703 03/07/19 2011 03/07/19 2337  GLUCAP 65* 92 111* 159* 174*     Recent Results (from the past 240 hour(s))  MRSA PCR Screening     Status: None   Collection Time: 02/27/19  5:00 PM   Specimen: Nasopharyngeal  Result Value Ref Range Status   MRSA by PCR NEGATIVE NEGATIVE Final    Comment:        The GeneXpert MRSA Assay (FDA approved for NASAL specimens only), is one component of a comprehensive MRSA colonization surveillance program. It is not intended to diagnose MRSA infection nor to guide or monitor treatment for MRSA infections. Performed at [facility], [address] [place], [place] [address]       Radiology Studies: No results found.     LOS: 10 days      [facility] Pager on [email or web address]  03/08/2019, 10:21 AM    

## 2019-03-08 NOTE — Progress Notes (Signed)
  Speech Language Pathology Treatment: Dysphagia  Patient Details Name: BRECKYN TICAS MRN: [id number] DOB: Jan 25, 1981 Today's Date: 03/08/2019 Time: 1202-1212 SLP Time Calculation (min) (ACUTE ONLY): 10 min  Assessment / Plan / Recommendation Clinical Impression  Pt has done well with full liquids since initiated yesterday. RN reported he consumed approximately 75% of breakfast and no concerns re: airway protection were demonstrated. His lips are healing with scabs and therapist did not disturb allowing them and pink oral mucosa except for areas of candidias. Pt prefers cups and consumed sips water without anterior spill today- he declined purees this session. Synchronized breathe/swallow pattern with vitals stable on room air. Cortrak in place running at 50/hour. Not yet ready for trials of upgraded textures (pt also did not wish to attempt). Plan to follow up first of next week.     HPI HPI: 39 y.o. male admitted on 02/26/19 for for DKA, ETOH 215, COVID-19.  PMH: alcoholism (per father 10 years), depression, alcoholic hepatitis. Was dx 02/18/19 with COVID, however, left [facility] ED AMA. Intubated 02/26/19-03/04/19 and transferred to [facility]. Started on CRRT on 03/02/19, hypotensive and bradycardic.        SLP Plan  Continue with current plan of care       Recommendations  Diet recommendations: Thin liquid;Other(comment)(full liquids) Liquids provided via: Cup Medication Administration: Crushed with puree Supervision: Patient able to self feed;Full supervision/cueing for compensatory strategies Compensations: Slow rate;Small sips/bites Postural Changes and/or Swallow Maneuvers: Seated upright 90 degrees                Oral Care Recommendations: Oral care QID Follow up Recommendations: Inpatient Rehab SLP Visit Diagnosis: Dysphagia, unspecified (R13.10) Plan: Continue with current plan of care       GO                Royce Macadamia 03/08/2019, 2:22 PM   [phone]

## 2019-03-09 DIAGNOSIS — R52 Pain, unspecified: Secondary | ICD-10-CM

## 2019-03-09 LAB — CBC
HCT: 24.8 % — ABNORMAL LOW (ref 39.0–52.0)
Hemoglobin: 8.4 g/dL — ABNORMAL LOW (ref 13.0–17.0)
MCH: 29.6 pg (ref 26.0–34.0)
MCHC: 33.9 g/dL (ref 30.0–36.0)
MCV: 87.3 fL (ref 80.0–100.0)
Platelets: 158 10*3/uL (ref 150–400)
RBC: 2.84 MIL/uL — ABNORMAL LOW (ref 4.22–5.81)
RDW: 14.7 % (ref 11.5–15.5)
WBC: 11.9 10*3/uL — ABNORMAL HIGH (ref 4.0–10.5)
nRBC: 0 % (ref 0.0–0.2)

## 2019-03-09 LAB — COMPREHENSIVE METABOLIC PANEL
ALT: 58 U/L — ABNORMAL HIGH (ref 0–44)
AST: 31 U/L (ref 15–41)
Albumin: 2.4 g/dL — ABNORMAL LOW (ref 3.5–5.0)
Alkaline Phosphatase: 247 U/L — ABNORMAL HIGH (ref 38–126)
Anion gap: 10 (ref 5–15)
BUN: 55 mg/dL — ABNORMAL HIGH (ref 6–20)
CO2: 23 mmol/L (ref 22–32)
Calcium: 7.9 mg/dL — ABNORMAL LOW (ref 8.9–10.3)
Chloride: 112 mmol/L — ABNORMAL HIGH (ref 98–111)
Creatinine, Ser: 3.43 mg/dL — ABNORMAL HIGH (ref 0.61–1.24)
GFR calc Af Amer: 25 mL/min — ABNORMAL LOW (ref >60)
GFR calc non Af Amer: 22 mL/min — ABNORMAL LOW (ref >60)
Glucose, Bld: 141 mg/dL — ABNORMAL HIGH (ref 70–99)
Potassium: 3.9 mmol/L (ref 3.5–5.1)
Sodium: 145 mmol/L (ref 135–145)
Total Bilirubin: 0.8 mg/dL (ref 0.3–1.2)
Total Protein: 6.1 g/dL — ABNORMAL LOW (ref 6.5–8.1)

## 2019-03-09 LAB — GLUCOSE, CAPILLARY
Glucose-Capillary: 140 mg/dL — ABNORMAL HIGH (ref 70–99)
Glucose-Capillary: 147 mg/dL — ABNORMAL HIGH (ref 70–99)
Glucose-Capillary: 195 mg/dL — ABNORMAL HIGH (ref 70–99)
Glucose-Capillary: 134 mg/dL — ABNORMAL HIGH (ref 70–99)
Glucose-Capillary: 89 mg/dL (ref 70–99)
Glucose-Capillary: 123 mg/dL — ABNORMAL HIGH (ref 70–99)
Glucose-Capillary: 123 mg/dL — ABNORMAL HIGH (ref 70–99)

## 2019-03-09 MED ORDER — CLONAZEPAM 0.5 MG PO TABS
0.5000 mg | ORAL_TABLET | Freq: Two times a day (BID) | ORAL | Status: DC
  Administered 2019-03-09 – 2019-03-13 (×9): 0.5 mg via ORAL
  Filled 2019-03-09 (×9): qty 1

## 2019-03-09 MED ORDER — INSULIN ASPART 100 UNIT/ML ~~LOC~~ SOLN
0.0000 [IU] | Freq: Three times a day (TID) | SUBCUTANEOUS | Status: DC
  Administered 2019-03-09: 2 [IU] via SUBCUTANEOUS
  Administered 2019-03-10 – 2019-03-11 (×3): 1 [IU] via SUBCUTANEOUS
  Administered 2019-03-11: 2 [IU] via SUBCUTANEOUS
  Administered 2019-03-12: 08:00:00 9 [IU] via SUBCUTANEOUS
  Administered 2019-03-13: 3 [IU] via SUBCUTANEOUS
  Administered 2019-03-13 (×2): 2 [IU] via SUBCUTANEOUS

## 2019-03-09 MED ORDER — LORAZEPAM 0.5 MG PO TABS
0.5000 mg | ORAL_TABLET | Freq: Once | ORAL | Status: AC
  Administered 2019-03-09: 0.5 mg via ORAL
  Filled 2019-03-09: qty 1

## 2019-03-09 MED ORDER — INSULIN ASPART 100 UNIT/ML ~~LOC~~ SOLN
0.0000 [IU] | Freq: Every day | SUBCUTANEOUS | Status: DC
  Administered 2019-03-12: 3 [IU] via SUBCUTANEOUS

## 2019-03-09 MED ORDER — FENTANYL CITRATE (PF) 100 MCG/2ML IJ SOLN
25.0000 ug | INTRAMUSCULAR | Status: DC | PRN
  Administered 2019-03-09 – 2019-03-10 (×5): 25 ug via INTRAVENOUS
  Filled 2019-03-09 (×5): qty 2

## 2019-03-09 MED ORDER — OXYCODONE HCL 5 MG PO TABS
5.0000 mg | ORAL_TABLET | ORAL | Status: DC | PRN
  Administered 2019-03-09 – 2019-03-13 (×18): 5 mg via ORAL
  Filled 2019-03-09 (×18): qty 1

## 2019-03-09 NOTE — Plan of Care (Signed)
Stayed up all night watching clock for pain medication.  V/s stable. Problem: Education: Goal: Knowledge of risk factors and measures for prevention of condition will improve Outcome: Progressing

## 2019-03-09 NOTE — Progress Notes (Signed)
Occupational Therapy Treatment Patient Details Name: Shawn Watkins MRN: [id number] DOB: 1980/09/04 Today's Date: 03/09/2019    History of present illness 39 y.o. male admitted on 02/26/19 for for DKA, ETOH 215, COVID-19.  Was dx 02/18/19 with COVID, however, left [facility] ED AMA.  He was intubated at [facility] on 02/26/19 and transferred to [facility] for further care.  He was started on CRRT on 03/02/19, hypotensive and bradycardic.  Pt extubated 03/03/18. Pt with significant PMH of alcoholism (per father 41 years), DM2, depression, alcoholic hepatitis.     OT comments  Pt has made significant progress and is ambulating independently and completing basic self care @ modified independent level. Walked @ 400 ft on RA with SpO2 remaining in the 90s. At end of session pt talked about bein  "depressed over the whole situation" and how he is frustrated with himself for drinking after just beign discharged from the alcohol rehab program. Pt states "I know in my heart that I need to go to rehab again". 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 Recommend pt DC home with 24/7 S and HHOT to facilitate return to IADL tasks. Will continue to follow acutely.   Follow Up Recommendations  Other (comment);Home health OT;Supervision/Assistance - 24 hour(Pt requesting to go to Inpatient Alcohol Rehab program)    Equipment Recommendations  3 in 1 bedside commode    Recommendations for Other Services      Precautions / Restrictions         Mobility Bed Mobility Overal bed mobility: Independent                Transfers Overall transfer level: Modified independent                    Balance     Sitting balance-Leahy Scale: Good       Standing balance-Leahy Scale: Good                             ADL either performed or assessed with clinical judgement   ADL                                       Functional mobility during ADLs: Modified  independent General ADL Comments: Able to complete ADL with set up; states he took a shower yesterday     Vision       Perception     Praxis      Cognition Arousal/Alertness: Awake/alert Behavior During Therapy: Flat affect(frustrated with himself and "a little depressed") Overall Cognitive Status: Impaired/Different from baseline Area of Impairment: Orientation;Memory;Awareness                 Orientation Level: Disoriented to;Time Current Attention Level: Selective Memory: Decreased short-term memory Following Commands: Follows multi-step commands consistently   Awareness: Anticipatory   General Comments: cognition significatnly imporved. Pt began to talk about how he is frustrated with himself about drinking again so soon after getting out of rehab.         Exercises Exercises: Other exercises Other Exercises Other Exercises: encouraged incentive spirometer   Shoulder Instructions       General Comments  Pt with B hand weakness and would benefit from hand strengthening exercises.     Pertinent Vitals/ Pain       Pain Assessment: Faces Faces  Pain Scale: Hurts little more Pain Location: mouth; stomach, bottom, "everything" Pain Descriptors / Indicators: Aching Pain Intervention(s): Limited activity within patient's tolerance;Patient requesting pain meds-RN notified  Home Living                                          Prior Functioning/Environment              Frequency  Min 2X/week        Progress Toward Goals  OT Goals(current goals can now be found in the care plan section)  Progress towards OT goals: Goals met and updated - see care plan  Acute Rehab OT Goals Patient Stated Goal: to go to alcohol rehab OT Goal Formulation: With patient Time For Goal Achievement: 03/20/19 Potential to Achieve Goals: Good ADL Goals Pt Will Perform Grooming: with set-up;sitting Pt Will Perform Lower Body Bathing: with  supervision;with set-up;sit to/from stand Pt Will Perform Lower Body Dressing: with supervision;with set-up;sit to/from stand Pt Will Transfer to Toilet: with min guard assist;ambulating;bedside commode Pt Will Perform Toileting - Clothing Manipulation and hygiene: with supervision;sit to/from stand Additional ADL Goal #1: Pt will demosntrate anticipatory awareness during ADL tasks in minimally distracting environment  Plan Discharge plan needs to be updated;Frequency needs to be updated    Co-evaluation                 AM-PAC OT "6 Clicks" Daily Activity     Outcome Measure   Help from another person eating meals?: None Help from another person taking care of personal grooming?: None Help from another person toileting, which includes using toliet, bedpan, or urinal?: None Help from another person bathing (including washing, rinsing, drying)?: None Help from another person to put on and taking off regular upper body clothing?: None Help from another person to put on and taking off regular lower body clothing?: None 6 Click Score: 24    End of Session    OT Visit Diagnosis: Unsteadiness on feet (R26.81);Other abnormalities of gait and mobility (R26.89);Muscle weakness (generalized) (M62.81);Other symptoms and signs involving cognitive function;Pain Pain - part of body: (all over)   Activity Tolerance Patient tolerated treatment well   Patient Left in chair;with call bell/phone within reach   Nurse Communication Mobility status;Patient requests pain meds        Time: 2767-0110 OT Time Calculation (min): 31 min  Charges: OT General Charges $OT Visit: 1 Visit OT Treatments $Self Care/Home Management : 23-37 mins  Maurie Boettcher, OT/L   Acute OT Clinical Specialist [place] Pager [phone] Office [phone]    [facility] 03/09/2019, 1:25 PM

## 2019-03-09 NOTE — Progress Notes (Signed)
Inpatient Rehabilitation-Admissions Coordinator   Was notified by PM&R MD, Dr. Carlis Abbott, that the patient has progressed well with mobility and ADLs today and does not need an IP Rehab stay.   AC will sign off.   Please call if questions.   Cheri Rous, OTR/L  Rehab Admissions Coordinator  [phone] 03/09/2019 1:30 PM

## 2019-03-09 NOTE — Progress Notes (Signed)
  Speech Language Pathology Treatment: Dysphagia  Patient Details Name: Shawn Watkins MRN: [id number] DOB: [birth year] Today's Date: 03/09/2019 Time: 6553-7482 SLP Time Calculation (min) (ACUTE ONLY): 16 min  Assessment / Plan / Recommendation Clinical Impression  Pt demeanor is brighter; smiled at therapist upon arrival. Noted pt adamant for NGT removal and now on soft diet. No cueing needed with soft cereal bar and manipulated efficiently. Pt asked why mouth hurt when he eats reminding him of the status of his lips and oral cavity which are steadily improving. Oropharyngeal swallowing appears safe and agree with previous upgrade to soft texture. He can advance to regular with nursing staff when he feels he is ready. ST will sign off.     HPI HPI: 39 y.o. male admitted on 02/26/19 for for DKA, ETOH 215, COVID-19.  PMH: alcoholism (per father 10 years), depression, alcoholic hepatitis. Was dx 02/18/19 with COVID, however, left [facility] ED AMA. Intubated 02/26/19-03/04/19 and transferred to [place]. Started on CRRT on 03/02/19, hypotensive and bradycardic.        SLP Plan  All goals met;Discharge SLP treatment due to (comment)       Recommendations  Diet recommendations: Dysphagia 3 (mechanical soft);Thin liquid Liquids provided via: Cup Medication Administration: Whole meds with liquid Supervision: Patient able to self feed Compensations: Slow rate;Small sips/bites Postural Changes and/or Swallow Maneuvers: Seated upright 90 degrees                Oral Care Recommendations: Oral care QID Follow up Recommendations: None SLP Visit Diagnosis: Dysphagia, unspecified (R13.10) Plan: All goals met;Discharge SLP treatment due to (comment)       GO                Shawn Watkins 03/09/2019, 3:50 PM

## 2019-03-09 NOTE — Progress Notes (Signed)
 PROGRESS NOTE  Shawn Watkins MRN:[id number] DOB: 10/20/1980 DOA: 02/26/2019  PCP: Patient, No Pcp Per  Brief History/Interval Summary: 38 y/o male with a past medical history of type 1 diabetes, alcohol abuse, history of pancreatitis, depression, seizures, seen 1/3 with nausea, vomiting, sore throat and body aches.  Diagnosed with COVID, then left for home AMA.  Returned 1/11 to [facility], in DKA, intubated for delirium and transferred to [facility]. Course complicated by AKI and persistent hypotension.  Reason for Visit: Acute renal failure.  Diabetic ketoacidosis  Consultants: Pulmonology.  Nephrology  Procedures:   Transthoracic echocardiogram 03/04/2019  1. Left ventricular ejection fraction, by visual estimation, is 60 to 65%. The left ventricle has normal function. There is no increased left ventricular wall thickness.  2. Global right ventricle has normal systolc function.The right ventricular size is normal. no increase in right ventricular wall thickness.  3. The mitral valve is normal in structure. Trivial mitral valve regurgitation. No evidence of mitral stenosis.  4. The tricuspid valve was normal in structure. Tricuspid valve regurgitation is mild.  5. Tricuspid valve regurgitation is mild.  6. No evidence of aortic valve sclerosis or stenosis.  7. The inferior vena cava is normal in size with greater than 50% respiratory variability, suggesting right atrial pressure of 3 mmHg.  Antibiotics: Anti-infectives (From admission, onward)   Start     Dose/Rate Route Frequency Ordered Stop   03/06/19 0500  valACYclovir (VALTREX) tablet 500 mg     500 mg Oral 2 times daily 03/06/19 0344     03/03/19 0000  ceFEPIme (MAXIPIME) 2 g in sodium chloride 0.9 % 100 mL IVPB     2 g 200 mL/hr over 30 Minutes Intravenous Every 12 hours 03/02/19 1552 03/04/19 1415   02/28/19 1200  ceFEPIme (MAXIPIME) 2 g in sodium chloride 0.9 % 100 mL IVPB  Status:  Discontinued     2 g 200 mL/hr over 30 Minutes  Intravenous Every 24 hours 02/27/19 1539 03/02/19 1552   02/26/19 1400  ceFEPIme (MAXIPIME) 2 g in sodium chloride 0.9 % 100 mL IVPB  Status:  Discontinued     2 g 200 mL/hr over 30 Minutes Intravenous Every 12 hours 02/26/19 0912 02/27/19 1539   02/26/19 1200  ceFEPIme (MAXIPIME) 2 g in sodium chloride 0.9 % 100 mL IVPB  Status:  Discontinued     2 g 200 mL/hr over 30 Minutes Intravenous Every 8 hours 02/26/19 0249 02/26/19 0912   02/26/19 1032  vancomycin variable dose per unstable renal function (pharmacist dosing)  Status:  Discontinued      Does not apply See admin instructions 02/26/19 1032 02/28/19 1235   02/26/19 0300  vancomycin (VANCOREADY) IVPB 1750 mg/350 mL  Status:  Discontinued     1,750 mg 175 mL/hr over 120 Minutes Intravenous Every 12 hours 02/26/19 0247 02/26/19 1032   02/26/19 0245  ceFEPIme (MAXIPIME) 2 g in sodium chloride 0.9 % 100 mL IVPB     2 g 200 mL/hr over 30 Minutes Intravenous  Once 02/26/19 0240 02/26/19 0350   02/26/19 0245  metroNIDAZOLE (FLAGYL) IVPB 500 mg     500 mg 100 mL/hr over 60 Minutes Intravenous  Once 02/26/19 0240 02/26/19 0452   02/26/19 0245  vancomycin (VANCOCIN) IVPB 1000 mg/200 mL premix  Status:  Discontinued     1,000 mg 200 mL/hr over 60 Minutes Intravenous  Once 02/26/19 0240 02/26/19 0244   02/26/19 0245  vancomycin (VANCOREADY) IVPB 1500 mg/300 mL  Status:    Discontinued     1,500 mg 150 mL/hr over 120 Minutes Intravenous  Once 02/26/19 0244 02/26/19 0247      Subjective/Interval History: Patient continues to complain of pain in his mouth.  He continues to ask for more pain medications.  He was told about the potential side effect of narcotics.  He was told that he needs to try taking the medicines by mouth for a longer effect.  Otherwise he feels well.  Denies any shortness of breath.  He is happy that his kidneys are getting better.     Assessment/Plan:  Acute Hypoxic Resp. Failure/Pneumonia due to COVID-19/aspiration  pneumonia Patient's chest x-ray showed bilateral airspace disease.  This was thought to be secondary to COVID-19 along with aspiration.  Patient had to be intubated.  Due to significantly elevated LFTs he was not given remdesivir.  He was treated with antibacterials including vancomycin and cefepime. From a respiratory standpoint patient has improved.  He does not have any respiratory symptoms currently.  He is saturating normal on room air.  Continue with incentive spirometry mobilization.  PT and OT.  Acute kidney injury Thought to be secondary to rhabdomyolysis.  He was initially on CRRT.  Off of CRRT since 01/17.  He has started making urine.  His creatinine is improving.  Dialysis catheter and Foley catheter was discontinued.  Continue to monitor urine output.  Cut back on IV fluids.  Nephrology has been following.    Oral ulcers Likely due to acute viral infection.  There also sores on his lips.  Patient started on Valtrex by PCCM which will be continued for now.  He has found it difficult to swallow due to pain.  However this appears to have improved.  He is able to tolerate full liquid diet.  Will advance to soft diet.  Continue with Magic mouthwash.  Generalized body ache Patient requesting escalating doses of narcotics.  He states that his entire body is hurting.  He was educated about narcotics and potential side effects and why it is not a good idea for him to be on these medications.  Patient however continues to demand these medications.  He was told that these doses will not be increased.  He will be slowly taken off of the fentanyl.  Tylenol is ordered.  Dysphagia This is a result of his mouth sores.  Cor track feeding tube was placed.  However yesterday he demanded that the feeding to be removed.  After much discussion patient was adamant and so the feeding tube was discontinued.  He states that he is able to tolerate his diet.  Advance to soft today.    Acute metabolic  encephalopathy This was a multifactorial in the setting of DKA, renal failure, COVID-19, alcohol withdrawal.  Seems to have improved.  He was treated with Librium taper.  Continue with thiamine and folic acid.  Diabetes mellitus type 1, uncontrolled with hyperglycemia/DKA on presentation DKA resolved.  Continue SSI Lantus.  CBGs are reasonably well controlled.  Watch for hypoglycemia.  HbA1c 12.2.    Demand ischemia Patient did have elevated troponin levels.  This was thought to be secondary to demand ischemia from acute illness.  EKG did not show any ischemic changes.  Nonspecific changes were noted.  Echocardiogram was done which does not show any wall motion abnormality.  Normal LV function.  Aspirin was added which will be continued.  He will benefit from seeing cardiology in the outpatient setting.  Acute alcoholic hepatitis LFTs have improved.  Continue to monitor periodically.  Alcohol withdrawal syndrome Stable.  No active withdrawal symptoms noted.  Treated with Librium protocol.  Still anxious which could be contributing to him demanding pain medications.  Will initiate Klonopin.  Thrombocytopenia and normocytic anemia Low platelet counts most likely alcohol-related along with acute infection.  Platelet counts are now normal.  Hemoglobin is low but stable.  No evidence for overt bleeding.   Pressure injury, present on admission Pressure Injury 02/26/19 Sacrum Right;Left;Medial Deep Tissue Pressure Injury - Purple or maroon localized area of discolored intact skin or blood-filled blister due to damage of underlying soft tissue from pressure and/or shear. 1/14, wound consult, low (Active)  02/26/19 1200  Location: Sacrum  Location Orientation: Right;Left;Medial  Staging: Deep Tissue Pressure Injury - Purple or maroon localized area of discolored intact skin or blood-filled blister due to damage of underlying soft tissue from pressure and/or shear.  Wound Description (Comments): 1/14,  wound consult, low air loss mattress applied  Present on Admission: Yes     Pressure Injury 03/02/19 Lip Medial;Upper Deep Tissue Pressure Injury - Purple or maroon localized area of discolored intact skin or blood-filled blister due to damage of underlying soft tissue from pressure and/or shear. (Active)  03/02/19 1930  Location: Lip  Location Orientation: Medial;Upper  Staging: Deep Tissue Pressure Injury - Purple or maroon localized area of discolored intact skin or blood-filled blister due to damage of underlying soft tissue from pressure and/or shear.  Wound Description (Comments):   Present on Admission: No     Pressure Injury 03/02/19 Ear Right Stage 2 -  Partial thickness loss of dermis presenting as a shallow open injury with a red, pink wound bed without slough. (Active)  03/02/19 1930  Location: Ear  Location Orientation: Right  Staging: Stage 2 -  Partial thickness loss of dermis presenting as a shallow open injury with a red, pink wound bed without slough.  Wound Description (Comments):   Present on Admission: No     Pressure Injury 03/02/19 Buttocks Right;Left Deep Tissue Pressure Injury - Purple or maroon localized area of discolored intact skin or blood-filled blister due to damage of underlying soft tissue from pressure and/or shear. (Active)  03/02/19 1930  Location: Buttocks  Location Orientation: Right;Left  Staging: Deep Tissue Pressure Injury - Purple or maroon localized area of discolored intact skin or blood-filled blister due to damage of underlying soft tissue from pressure and/or shear.  Wound Description (Comments):   Present on Admission:     DVT Prophylaxis: SCDs Code Status: Full code Family Communication: Discussed with patient.  He is okay with me updating his father. Disposition Plan: Mobilize.  He states that he will go back home with his father when ready for discharge.   Medications:  Scheduled: . chlorhexidine gluconate (MEDLINE KIT)  15 mL  Mouth Rinse BID  . Chlorhexidine Gluconate Cloth  6 each Topical Daily  . clonazePAM  0.5 mg Oral BID  . darbepoetin (ARANESP) injection - NON-DIALYSIS  40 mcg Subcutaneous Q Tue-1800  . feeding supplement (PRO-STAT SUGAR FREE 64)  30 mL Per Tube BID  . folic acid  1 mg Oral Daily  . influenza vac split quadrivalent PF  0.5 mL Intramuscular Tomorrow-1000  . insulin aspart  0-5 Units Subcutaneous QHS  . insulin aspart  0-9 Units Subcutaneous TID WC  . insulin glargine  5 Units Subcutaneous Daily  . lactulose  30 g Oral Daily  . magic mouthwash w/lidocaine  10 mL Oral TID  .  mouth rinse  15 mL Mouth Rinse 10 times per day  . thiamine  100 mg Oral Daily  . valACYclovir  500 mg Oral BID   Continuous: . sodium chloride Stopped (03/08/19 1930)   PQD:IYMEBRAXENMMH, dextrose, docusate, fentaNYL (SUBLIMAZE) injection, guaiFENesin, heparin, lip balm, ondansetron (ZOFRAN) IV, oxyCODONE   Objective:  Vital Signs  Vitals:   03/08/19 1955 03/09/19 0025 03/09/19 0445 03/09/19 0703  BP: 131/70 126/68 (!) 119/59 (!) 132/96  Pulse: 70 72 70 84  Resp: '20 20 20   '$ Temp: (!) 97.4 F (36.3 C) (!) 97.2 F (36.2 C) 98.1 F (36.7 C) 98.4 F (36.9 C)  TempSrc: Axillary Axillary Axillary Axillary  SpO2: 97% 99% 98% 92%  Weight:      Height:        Intake/Output Summary (Last 24 hours) at 03/09/2019 1206 Last data filed at 03/09/2019 0400 Gross per 24 hour  Intake 1731.33 ml  Output 1100 ml  Net 631.33 ml   Filed Weights   03/06/19 0500 03/07/19 0500 03/08/19 0332  Weight: 97 kg 94.2 kg 94 kg    General appearance: Awake alert.  In no distress Sores on his lips.  Few in his oral cavity.  Seems to be improving. Resp: Few crackles at the bases bilaterally.  No wheezing or rhonchi.  Normal effort at rest. Cardio: S1-S2 is normal regular.  No S3-S4.  No rubs murmurs or bruit GI: Abdomen is soft.  Nontender nondistended.  Bowel sounds are present normal.  No masses organomegaly Extremities: No  edema.  Full range of motion of lower extremities. Neurologic: Alert and oriented x3.  No focal neurological deficits.     Lab Results:  Data Reviewed: I have personally reviewed following labs and imaging studies  CBC: Recent Labs  Lab 03/03/19 0515 03/04/19 0525 03/05/19 0520 03/08/19 0417 03/09/19 0108  WBC 7.9 6.1 9.5 8.9 11.9*  NEUTROABS 5.5  --   --   --   --   HGB 8.5* 8.3* 7.7* 7.5* 8.4*  HCT 24.1* 22.7* 21.8* 22.3* 24.8*  MCV 83.4 86.3 84.5 86.1 87.3  PLT 28* 26* 65* 120* 680    Basic Metabolic Panel: Recent Labs  Lab 03/02/19 2202 03/02/19 2202 03/03/19 0515 03/03/19 0515 03/03/19 1615 03/03/19 1615 03/04/19 0525 03/04/19 0525 03/05/19 0520 03/06/19 0510 03/07/19 0454 03/08/19 0417 03/09/19 0108  NA 140   < > 140   < > 140   < > 139   < > 140 143 144 145 145  K 4.4   < > 4.3   < > 3.8   < > 4.0   < > 4.0 3.9 3.9 3.8 3.9  CL 104   < > 105   < > 106   < > 104   < > 107 109 110 113* 112*  CO2 21*   < > 25   < > 27   < > 27   < > '25 24 23 23 23  '$ GLUCOSE 238*   < > 224*   < > 163*   < > 121*   < > 148* 151* 121* 222* 141*  BUN 88*   < > 83*   < > 69*   < > 50*   < > 50* 54* 60* 62* 55*  CREATININE 5.54*   < > 4.88*   < > 4.02*   < > 3.17*   < > 3.53* 4.63* 4.63* 4.10* 3.43*  CALCIUM 7.1*   < > 7.1*   < >  7.1*   < > 7.1*   < > 7.0* 7.3* 7.3* 7.3* 7.9*  MG 1.6*  --  1.9  --   --   --  2.0  --   --   --   --   --   --   PHOS  --   --  2.0*   < > 1.3*  --  1.5*  --  3.5 3.6 4.0  --   --    < > = values in this interval not displayed.    GFR: Estimated Creatinine Clearance: 33.3 mL/min (A) (by C-G formula based on SCr of 3.43 mg/dL (H)).  Liver Function Tests: Recent Labs  Lab 03/05/19 0520 03/06/19 0510 03/07/19 0454 03/08/19 0417 03/09/19 0108  AST 101* 57* 44* 31 31  ALT 109* 87* 72* 57* 58*  ALKPHOS 279* 295* 278* 247* 247*  BILITOT 1.3* 0.9 1.0 0.7 0.8  PROT 4.7* 5.0* 5.2* 5.1* 6.1*  ALBUMIN 1.9*  2.0* 2.0*  2.0* 2.0*  2.1* 2.0* 2.4*      Recent Labs  Lab 03/04/19 0525  AMMONIA 25    CBG: Recent Labs  Lab 03/07/19 2337 03/08/19 0330 03/08/19 1626 03/09/19 0020 03/09/19 0709  GLUCAP 174* 195* 218* 140* 147*     Recent Results (from the past 240 hour(s))  MRSA PCR Screening     Status: None   Collection Time: 02/27/19  5:00 PM   Specimen: Nasopharyngeal  Result Value Ref Range Status   MRSA by PCR NEGATIVE NEGATIVE Final    Comment:        The GeneXpert MRSA Assay (FDA approved for NASAL specimens only), is one component of a comprehensive MRSA colonization surveillance program. It is not intended to diagnose MRSA infection nor to guide or monitor treatment for MRSA infections. Performed at [facility], [place] [address] [place], [place] [address]       Radiology Studies: No results found.     LOS: 11 days   Adalyna Godbee [employer] on [email or web address]  03/09/2019, 12:06 PM

## 2019-03-09 NOTE — Progress Notes (Signed)
Patient refusing accucheck until he can get his Fentanyl.  I educated patient on Fentanyl not being due until 530am. He states he is going to have a talk with his doctor today and make sure he knows he needs more Fentanyl.

## 2019-03-10 LAB — COMPREHENSIVE METABOLIC PANEL
ALT: 44 U/L (ref 0–44)
AST: 26 U/L (ref 15–41)
Albumin: 2.3 g/dL — ABNORMAL LOW (ref 3.5–5.0)
Alkaline Phosphatase: 216 U/L — ABNORMAL HIGH (ref 38–126)
Anion gap: 8 (ref 5–15)
BUN: 49 mg/dL — ABNORMAL HIGH (ref 6–20)
CO2: 23 mmol/L (ref 22–32)
Calcium: 7.8 mg/dL — ABNORMAL LOW (ref 8.9–10.3)
Chloride: 109 mmol/L (ref 98–111)
Creatinine, Ser: 2.71 mg/dL — ABNORMAL HIGH (ref 0.61–1.24)
GFR calc Af Amer: 33 mL/min — ABNORMAL LOW (ref >60)
GFR calc non Af Amer: 29 mL/min — ABNORMAL LOW (ref >60)
Glucose, Bld: 133 mg/dL — ABNORMAL HIGH (ref 70–99)
Potassium: 4.1 mmol/L (ref 3.5–5.1)
Sodium: 140 mmol/L (ref 135–145)
Total Bilirubin: 0.7 mg/dL (ref 0.3–1.2)
Total Protein: 5.8 g/dL — ABNORMAL LOW (ref 6.5–8.1)

## 2019-03-10 LAB — CBC
HCT: 23.8 % — ABNORMAL LOW (ref 39.0–52.0)
Hemoglobin: 7.6 g/dL — ABNORMAL LOW (ref 13.0–17.0)
MCH: 29.1 pg (ref 26.0–34.0)
MCHC: 31.9 g/dL (ref 30.0–36.0)
MCV: 91.2 fL (ref 80.0–100.0)
Platelets: 170 10*3/uL (ref 150–400)
RBC: 2.61 MIL/uL — ABNORMAL LOW (ref 4.22–5.81)
RDW: 14.6 % (ref 11.5–15.5)
WBC: 9.1 10*3/uL (ref 4.0–10.5)
nRBC: 0 % (ref 0.0–0.2)

## 2019-03-10 LAB — GLUCOSE, CAPILLARY
Glucose-Capillary: 134 mg/dL — ABNORMAL HIGH (ref 70–99)
Glucose-Capillary: 143 mg/dL — ABNORMAL HIGH (ref 70–99)
Glucose-Capillary: 81 mg/dL (ref 70–99)
Glucose-Capillary: 120 mg/dL — ABNORMAL HIGH (ref 70–99)
Glucose-Capillary: 79 mg/dL (ref 70–99)

## 2019-03-10 MED ORDER — PRO-STAT SUGAR FREE PO LIQD
30.0000 mL | Freq: Two times a day (BID) | ORAL | Status: DC
  Administered 2019-03-10 – 2019-03-11 (×3): 30 mL via ORAL
  Filled 2019-03-10 (×3): qty 30

## 2019-03-10 MED ORDER — VALACYCLOVIR HCL 500 MG PO TABS
1000.0000 mg | ORAL_TABLET | Freq: Two times a day (BID) | ORAL | Status: DC
  Administered 2019-03-10 – 2019-03-13 (×6): 1000 mg via ORAL
  Filled 2019-03-10 (×7): qty 2

## 2019-03-10 MED ORDER — FENTANYL CITRATE (PF) 100 MCG/2ML IJ SOLN
25.0000 ug | Freq: Four times a day (QID) | INTRAMUSCULAR | Status: DC | PRN
  Administered 2019-03-10 – 2019-03-12 (×8): 25 ug via INTRAVENOUS
  Filled 2019-03-10 (×8): qty 2

## 2019-03-10 NOTE — Progress Notes (Signed)
PHARMACY NOTE:  ANTIMICROBIAL RENAL DOSAGE ADJUSTMENT  Current regimen includes a mismatch between Valacylovir dosage and estimated renal function.  As per policy approved by the Pharmacy & Therapeutics and Medical Executive Committees, the dosage will be adjusted accordingly.  Current antimicrobial dosage:  Valacyclovir 500mg  bid  Indication: Possible herpes labialis  Renal Function: Creatinine trending down 4.63 > 2.71  Estimated Creatinine Clearance: 42.2 mL/min (A) (by C-G formula based on SCr of 2.71 mg/dL (H)). []      On intermittent HD, scheduled: []      On CRRT    Antimicrobial dosage has been changed to:  Valacyclovir 1000 mg bid  Additional comments: Will continue to monitor renal function to ensure tolerance.  Thank you for allowing pharmacy to be a part of this patient's care.  , PharmD., MS Clinical Pharmacist Pager:  [phone] Thank you for allowing pharmacy to be part of this patients care team. 03/10/2019 10:10 AM

## 2019-03-10 NOTE — Progress Notes (Signed)
   03/10/19 2050  Provider Notification  Provider Name/Title Emelda Brothers   Date Provider Notified 03/10/19  Time Provider Notified 2050  Notification Type Page  Notification Reason Requested by patient/family (unrelieved pain)  Response No new orders  Date of Provider Response 03/10/19  Time of Provider Response 2104  Pt agitated, requesting to speak to the doctor in regards to pain medication frequency, c/o unrelieved pain 10/10 with no prn narcotics due at time time- pt is requesting the IV fentanyl. Pt declines the available prn tylenol as it is "ineffective". MD spoke with patient for approx duration via telephone in regards to pt concerns/complaints.

## 2019-03-10 NOTE — Plan of Care (Signed)
Pt educated on the sacral wound care and the need to allow staff to perform wound care. At 1am pt called nurse to the room and wanted to show nurse the sacral wound and get care.  Sacral wound cleansed as it had a foul smell and yellow discharge. A clean foam dressing was applied. Encouraged pt to reposition away from the wound and allow frequent wound care per the wocn order.

## 2019-03-10 NOTE — Plan of Care (Signed)
Patient really depends on his pain medications.  Patient became upset when MD changed the frequency to every 6 ours for the [facility] IVP.    Problem: Coping: Goal: Psychosocial and spiritual needs will be supported Outcome: Progressing   Problem: Respiratory: Goal: Will maintain a patent airway Outcome: Progressing   Problem: Education: Goal: Ability to describe self-care measures that may prevent or decrease complications (Diabetes Survival Skills Education) will improve Outcome: Progressing   Problem: Metabolic: Goal: Ability to maintain appropriate glucose levels will improve Outcome: Progressing   Problem: Nutritional: Goal: Maintenance of adequate nutrition will improve Outcome: Progressing   Problem: Tissue Perfusion: Goal: Adequacy of tissue perfusion will improve Outcome: Progressing   Problem: Activity: Goal: Ability to tolerate increased activity will improve Outcome: Progressing   Problem: Respiratory: Goal: Ability to maintain a clear airway and adequate ventilation will improve Outcome: Progressing   Problem: Role Relationship: Goal: Method of communication will improve Outcome: Progressing

## 2019-03-10 NOTE — Plan of Care (Signed)
   Vital Signs MEWS/VS Documentation       03/09/2019 0703 03/09/2019 1638 03/09/2019 2000 03/09/2019 2037   MEWS Score:  0  0  0  0   MEWS Score Color:  Green  Green  Green  Green   Resp:  --  18  --  20   Pulse:  84  76  --  68   BP:  (!) 132/96  125/90  --  124/82   Temp:  98.4 F (36.9 C)  98.5 F (36.9 C)  --  98.5 F (36.9 C)   O2 Device:  Room Air  Room Air  --  Room Air   Level of Consciousness:  Alert  --  Alert  --       POC reviewed with pt.      Darcus Pester Zeta Bucy 03/10/2019,2:47 AM

## 2019-03-10 NOTE — Progress Notes (Signed)
PROGRESS NOTE  KEYAAN LEDERMAN EGB:[id number] DOB: Aug 04, 1980 DOA: 02/26/2019  PCP: Patient, No Pcp Per  Brief History/Interval Summary: 39 y/o male with a past medical history of type 1 diabetes, alcohol abuse, history of pancreatitis, depression, seizures, seen 1/3 with nausea, vomiting, sore throat and body aches.  Diagnosed with COVID, then left for home AMA.  Returned 1/11 to [facility], in DKA, intubated for delirium and transferred to [facility]. Course complicated by AKI and persistent hypotension.  Reason for Visit: Acute renal failure.  Diabetic ketoacidosis  Consultants: Pulmonology.  Nephrology  Procedures:   Transthoracic echocardiogram 03/04/2019  1. Left ventricular ejection fraction, by visual estimation, is 60 to 65%. The left ventricle has normal function. There is no increased left ventricular wall thickness.  2. Global right ventricle has normal systolc function.The right ventricular size is normal. no increase in right ventricular wall thickness.  3. The mitral valve is normal in structure. Trivial mitral valve regurgitation. No evidence of mitral stenosis.  4. The tricuspid valve was normal in structure. Tricuspid valve regurgitation is mild.  5. Tricuspid valve regurgitation is mild.  6. No evidence of aortic valve sclerosis or stenosis.  7. The inferior vena cava is normal in size with greater than 50% respiratory variability, suggesting right atrial pressure of 3 mmHg.  Antibiotics: Anti-infectives (From admission, onward)   Start     Dose/Rate Route Frequency Ordered Stop   03/10/19 2200  valACYclovir (VALTREX) tablet 1,000 mg     1,000 mg Oral 2 times daily 03/10/19 1009     03/06/19 0500  valACYclovir (VALTREX) tablet 500 mg  Status:  Discontinued     500 mg Oral 2 times daily 03/06/19 0344 03/10/19 1009   03/03/19 0000  ceFEPIme (MAXIPIME) 2 g in sodium chloride 0.9 % 100 mL IVPB     2 g 200 mL/hr over 30 Minutes Intravenous Every 12 hours 03/02/19 1552 03/04/19 1415   02/28/19 1200  ceFEPIme (MAXIPIME) 2 g in sodium chloride 0.9 % 100 mL IVPB  Status:  Discontinued     2 g 200 mL/hr over 30 Minutes Intravenous Every 24 hours 02/27/19 1539 03/02/19 1552   02/26/19 1400  ceFEPIme (MAXIPIME) 2 g in sodium chloride 0.9 % 100 mL IVPB  Status:  Discontinued     2 g 200 mL/hr over 30 Minutes Intravenous Every 12 hours 02/26/19 0912 02/27/19 1539   02/26/19 1200  ceFEPIme (MAXIPIME) 2 g in sodium chloride 0.9 % 100 mL IVPB  Status:  Discontinued     2 g 200 mL/hr over 30 Minutes Intravenous Every 8 hours 02/26/19 0249 02/26/19 0912   02/26/19 1032  vancomycin variable dose per unstable renal function (pharmacist dosing)  Status:  Discontinued      Does not apply See admin instructions 02/26/19 1032 02/28/19 1235   02/26/19 0300  vancomycin (VANCOREADY) IVPB 1750 mg/350 mL  Status:  Discontinued     1,750 mg 175 mL/hr over 120 Minutes Intravenous Every 12 hours 02/26/19 0247 02/26/19 1032   02/26/19 0245  ceFEPIme (MAXIPIME) 2 g in sodium chloride 0.9 % 100 mL IVPB     2 g 200 mL/hr over 30 Minutes Intravenous  Once 02/26/19 0240 02/26/19 0350   02/26/19 0245  metroNIDAZOLE (FLAGYL) IVPB 500 mg     500 mg 100 mL/hr over 60 Minutes Intravenous  Once 02/26/19 0240 02/26/19 0452   02/26/19 0245  vancomycin (VANCOCIN) IVPB 1000 mg/200 mL premix  Status:  Discontinued     1,000  mg 200 mL/hr over 60 Minutes Intravenous  Once 02/26/19 0240 02/26/19 0244   02/26/19 0245  vancomycin (VANCOREADY) IVPB 1500 mg/300 mL  Status:  Discontinued     1,500 mg 150 mL/hr over 120 Minutes Intravenous  Once 02/26/19 0244 02/26/19 0247      Subjective/Interval History: Patient continues to complain of pain in his mouth and generalized body aches.  Once again asking for more pain medications.  Otherwise he is feeling well.  Denies any shortness of breath.     Assessment/Plan:  Acute Hypoxic Resp. Failure/Pneumonia due to COVID-19/aspiration pneumonia Patient's chest x-ray  showed bilateral airspace disease.  This was thought to be secondary to COVID-19 along with aspiration.  Patient had to be intubated.  Due to significantly elevated LFTs he was not given remdesivir.  He was treated with antibacterials including vancomycin and cefepime. Patient remained stable from a respiratory standpoint.  He is saturating normal on room air.  Continue with incentive spirometry and mobilization.  Acute kidney injury Thought to be secondary to rhabdomyolysis.  He was initially on CRRT.  Off of CRRT since 01/17.  He started making urine.  Creatinine continues to improve.  Cut back on IV fluids.  His dialysis catheter and Foley catheter was discontinued.  Looks like nephrology has signed off.    Oral ulcers Likely due to acute viral infection.  There also sores on his lips.  Patient started on Valtrex by PCCM which will be continued for now.  He has found it difficult to swallow due to pain.  However this appears to have improved.  Continue soft diet.  Continue with Magic mouthwash.    Generalized body ache Patient requesting escalating doses of narcotics.  He states that his entire body is hurting.  He was educated about narcotics and potential side effects and why it is not a good idea for him to be on these medications.  Patient however continues to demand these medications.  He was told that these doses will not be increased.  He will be slowly taken off of the fentanyl.  Tylenol is ordered.  Dysphagia Primarily due to sores in his mouth and lips.  Now improved.  Tolerating his diet.     Acute metabolic encephalopathy This was a multifactorial in the setting of DKA, renal failure, COVID-19, alcohol withdrawal.  Seems to have improved.  He was treated with Librium taper.  Continue with thiamine and folic acid.  Diabetes mellitus type 1, uncontrolled with hyperglycemia/DKA on presentation DKA resolved.  Continue SSI Lantus.  CBGs are reasonably well controlled.  Watch for  hypoglycemia.  HbA1c 12.2.    Demand ischemia Patient did have elevated troponin levels.  This was thought to be secondary to demand ischemia from acute illness.  EKG did not show any ischemic changes.  Nonspecific changes were noted.  Echocardiogram was done which does not show any wall motion abnormality.  Normal LV function.  Aspirin was added which will be continued.  He will benefit from seeing cardiology in the outpatient setting.  Acute alcoholic hepatitis LFTs have improved.  Continue to monitor periodically.  Alcohol withdrawal syndrome Improved.  Still remains anxious for which he is on Klonopin.  Continue for now.    Thrombocytopenia and normocytic anemia Low platelet counts most likely alcohol-related along with acute infection.  Platelet counts are now normal.  Hemoglobin is low but stable.  No evidence for overt bleeding.   Pressure injury, present on admission Pressure Injury 02/26/19 Sacrum Right;Left;Medial Deep Tissue  Pressure Injury - Purple or maroon localized area of discolored intact skin or blood-filled blister due to damage of underlying soft tissue from pressure and/or shear. 1/14, wound consult, low (Active)  02/26/19 1200  Location: Sacrum  Location Orientation: Right;Left;Medial  Staging: Deep Tissue Pressure Injury - Purple or maroon localized area of discolored intact skin or blood-filled blister due to damage of underlying soft tissue from pressure and/or shear.  Wound Description (Comments): 1/14, wound consult, low air loss mattress applied  Present on Admission: Yes     Pressure Injury 03/02/19 Lip Medial;Upper Deep Tissue Pressure Injury - Purple or maroon localized area of discolored intact skin or blood-filled blister due to damage of underlying soft tissue from pressure and/or shear. (Active)  03/02/19 1930  Location: Lip  Location Orientation: Medial;Upper  Staging: Deep Tissue Pressure Injury - Purple or maroon localized area of discolored intact  skin or blood-filled blister due to damage of underlying soft tissue from pressure and/or shear.  Wound Description (Comments):   Present on Admission: No     Pressure Injury 03/02/19 Ear Right Stage 2 -  Partial thickness loss of dermis presenting as a shallow open injury with a red, pink wound bed without slough. (Active)  03/02/19 1930  Location: Ear  Location Orientation: Right  Staging: Stage 2 -  Partial thickness loss of dermis presenting as a shallow open injury with a red, pink wound bed without slough.  Wound Description (Comments):   Present on Admission: No     Pressure Injury 03/02/19 Buttocks Right;Left Deep Tissue Pressure Injury - Purple or maroon localized area of discolored intact skin or blood-filled blister due to damage of underlying soft tissue from pressure and/or shear. (Active)  03/02/19 1930  Location: Buttocks  Location Orientation: Right;Left  Staging: Deep Tissue Pressure Injury - Purple or maroon localized area of discolored intact skin or blood-filled blister due to damage of underlying soft tissue from pressure and/or shear.  Wound Description (Comments):   Present on Admission:    Have requested wound care to reevaluate wound so that they can make recommendations for treating it at discharge.  DVT Prophylaxis: SCDs Code Status: Full code Family Communication: Father was updated yesterday. Disposition Plan: Mobilize.  He states that he will go back home with his father when ready for discharge.   Medications:  Scheduled: . chlorhexidine gluconate (MEDLINE KIT)  15 mL Mouth Rinse BID  . Chlorhexidine Gluconate Cloth  6 each Topical Daily  . clonazePAM  0.5 mg Oral BID  . darbepoetin (ARANESP) injection - NON-DIALYSIS  40 mcg Subcutaneous Q Tue-1800  . feeding supplement (PRO-STAT SUGAR FREE 64)  30 mL Per Tube BID  . folic acid  1 mg Oral Daily  . influenza vac split quadrivalent PF  0.5 mL Intramuscular Tomorrow-1000  . insulin aspart  0-5 Units  Subcutaneous QHS  . insulin aspart  0-9 Units Subcutaneous TID WC  . insulin glargine  5 Units Subcutaneous Daily  . lactulose  30 g Oral Daily  . magic mouthwash w/lidocaine  10 mL Oral TID  . mouth rinse  15 mL Mouth Rinse 10 times per day  . thiamine  100 mg Oral Daily  . valACYclovir  1,000 mg Oral BID   Continuous: . sodium chloride 50 mL/hr at 03/10/19 1035   RKY:[id number], dextrose, docusate, fentaNYL (SUBLIMAZE) injection, guaiFENesin, heparin, lip balm, ondansetron (ZOFRAN) IV, oxyCODONE   Objective:  Vital Signs  Vitals:   03/09/19 1638 03/09/19 2037 03/10/19 0422 03/10/19 7616  BP: 125/90 124/82 124/76 (!) 125/95  Pulse: 76 68 (!) 58 63  Resp: '18 20 18 18  '$ Temp: 98.5 F (36.9 C) 98.5 F (36.9 C) 98.5 F (36.9 C) 98.4 F (36.9 C)  TempSrc: Oral Oral Oral Oral  SpO2: 95% 95% 93% 95%  Weight:      Height:        Intake/Output Summary (Last 24 hours) at 03/10/2019 1431 Last data filed at 03/10/2019 0200 Gross per 24 hour  Intake 540 ml  Output --  Net 540 ml   Filed Weights   03/06/19 0500 03/07/19 0500 03/08/19 0332  Weight: 97 kg 94.2 kg 94 kg    General appearance: Awake alert.  In no distress Sores on his lips seem to be improving. Resp: Improved air entry.  Few crackles at the bases.  No wheezing or rhonchi. Cardio: S1-S2 is normal regular.  No S3-S4.  No rubs murmurs or bruit GI: Abdomen is soft.  Nontender nondistended.  Bowel sounds are present normal.  No masses organomegaly Extremities: No edema.  Full range of motion of lower extremities. Sacral wound was examined.  No discharge noted. Neurologic: Alert and oriented x3.  No focal neurological deficits.     Lab Results:  Data Reviewed: I have personally reviewed following labs and imaging studies  CBC: Recent Labs  Lab 03/04/19 0525 03/05/19 0520 03/08/19 0417 03/09/19 0108 03/10/19 0542  WBC 6.1 9.5 8.9 11.9* 9.1  HGB 8.3* 7.7* 7.5* 8.4* 7.6*  HCT 22.7* 21.8* 22.3* 24.8*  23.8*  MCV 86.3 84.5 86.1 87.3 91.2  PLT 26* 65* 120* 158 158    Basic Metabolic Panel: Recent Labs  Lab 03/03/19 1615 03/03/19 1615 03/04/19 0525 03/04/19 0525 03/05/19 0520 03/05/19 0520 03/06/19 0510 03/07/19 0454 03/08/19 0417 03/09/19 0108 03/10/19 0542  NA 140   < > 139   < > 140   < > 143 144 145 145 140  K 3.8   < > 4.0   < > 4.0   < > 3.9 3.9 3.8 3.9 4.1  CL 106   < > 104   < > 107   < > 109 110 113* 112* 109  CO2 27   < > 27   < > 25   < > '24 23 23 23 23  '$ GLUCOSE 163*   < > 121*   < > 148*   < > 151* 121* 222* 141* 133*  BUN 69*   < > 50*   < > 50*   < > 54* 60* 62* 55* 49*  CREATININE 4.02*   < > 3.17*   < > 3.53*   < > 4.63* 4.63* 4.10* 3.43* 2.71*  CALCIUM 7.1*   < > 7.1*   < > 7.0*   < > 7.3* 7.3* 7.3* 7.9* 7.8*  MG  --   --  2.0  --   --   --   --   --   --   --   --   PHOS 1.3*  --  1.5*  --  3.5  --  3.6 4.0  --   --   --    < > = values in this interval not displayed.    GFR: Estimated Creatinine Clearance: 42.2 mL/min (A) (by C-G formula based on SCr of 2.71 mg/dL (H)).  Liver Function Tests: Recent Labs  Lab 03/06/19 0510 03/07/19 0454 03/08/19 0417 03/09/19 0108 03/10/19 0542  AST 57* 44* 31 31 26  ALT 87* 72* 57* 58* 44  ALKPHOS 295* 278* 247* 247* 216*  BILITOT 0.9 1.0 0.7 0.8 0.7  PROT 5.0* 5.2* 5.1* 6.1* 5.8*  ALBUMIN 2.0*  2.0* 2.0*  2.1* 2.0* 2.4* 2.3*     Recent Labs  Lab 03/04/19 0525  AMMONIA 25    CBG: Recent Labs  Lab 03/09/19 1633 03/09/19 1951 03/09/19 2041 03/10/19 0730 03/10/19 1125  GLUCAP 89 123* 123* 134* 143*     No results found for this or any previous visit (from the past 240 hour(s)).    Radiology Studies: No results found.     LOS: 12 days   Taneeka Curtner [employer] on [email or web address]  03/10/2019, 2:31 PM

## 2019-03-10 NOTE — Progress Notes (Signed)
   03/10/19 2313  Provider Notification  Provider Name/Title Emelda Brothers   Date Provider Notified 03/10/19  Time Provider Notified 2301  Notification Type Page  Notification Reason Requested by patient/family (c/o anxiety, requesting medication intervention)  Response No new orders  Date of Provider Response 03/10/19  Time of Provider Response 2314  pt c/o anxiety, requesting medication intervention "librium or ativan" that I had before for a panic attack. Denies no other emotional/physcial needs other than ongoing pain- uncontrolled, prn fentanyl due soon. Upon arrival into pt's room for MD to talk via telephone to pt, pt is sleeping, did not disturb his resting at this time. Will continue to monitor.

## 2019-03-11 LAB — CBC
HCT: 22.2 % — ABNORMAL LOW (ref 39.0–52.0)
Hemoglobin: 7.2 g/dL — ABNORMAL LOW (ref 13.0–17.0)
MCH: 28.8 pg (ref 26.0–34.0)
MCHC: 32.4 g/dL (ref 30.0–36.0)
MCV: 88.8 fL (ref 80.0–100.0)
Platelets: 206 10*3/uL (ref 150–400)
RBC: 2.5 MIL/uL — ABNORMAL LOW (ref 4.22–5.81)
RDW: 14.3 % (ref 11.5–15.5)
WBC: 7.9 10*3/uL (ref 4.0–10.5)
nRBC: 0 % (ref 0.0–0.2)

## 2019-03-11 LAB — GLUCOSE, CAPILLARY
Glucose-Capillary: 98 mg/dL (ref 70–99)
Glucose-Capillary: 185 mg/dL — ABNORMAL HIGH (ref 70–99)
Glucose-Capillary: 133 mg/dL — ABNORMAL HIGH (ref 70–99)
Glucose-Capillary: 85 mg/dL (ref 70–99)

## 2019-03-11 LAB — COMPREHENSIVE METABOLIC PANEL
ALT: 36 U/L (ref 0–44)
AST: 23 U/L (ref 15–41)
Albumin: 2.1 g/dL — ABNORMAL LOW (ref 3.5–5.0)
Alkaline Phosphatase: 188 U/L — ABNORMAL HIGH (ref 38–126)
Anion gap: 7 (ref 5–15)
BUN: 46 mg/dL — ABNORMAL HIGH (ref 6–20)
CO2: 22 mmol/L (ref 22–32)
Calcium: 7.7 mg/dL — ABNORMAL LOW (ref 8.9–10.3)
Chloride: 108 mmol/L (ref 98–111)
Creatinine, Ser: 2.03 mg/dL — ABNORMAL HIGH (ref 0.61–1.24)
GFR calc Af Amer: 47 mL/min — ABNORMAL LOW (ref >60)
GFR calc non Af Amer: 41 mL/min — ABNORMAL LOW (ref >60)
Glucose, Bld: 236 mg/dL — ABNORMAL HIGH (ref 70–99)
Potassium: 5.4 mmol/L — ABNORMAL HIGH (ref 3.5–5.1)
Sodium: 137 mmol/L (ref 135–145)
Total Bilirubin: 0.4 mg/dL (ref 0.3–1.2)
Total Protein: 5.6 g/dL — ABNORMAL LOW (ref 6.5–8.1)

## 2019-03-11 LAB — POTASSIUM: Potassium: 5 mmol/L (ref 3.5–5.1)

## 2019-03-11 MED ORDER — DOCUSATE SODIUM 100 MG PO CAPS
100.0000 mg | ORAL_CAPSULE | Freq: Two times a day (BID) | ORAL | Status: DC
  Administered 2019-03-11 – 2019-03-13 (×5): 100 mg via ORAL
  Filled 2019-03-11 (×5): qty 1

## 2019-03-11 MED ORDER — CHLORHEXIDINE GLUCONATE 0.12 % MT SOLN
15.0000 mL | Freq: Two times a day (BID) | OROMUCOSAL | Status: DC
  Administered 2019-03-11 – 2019-03-13 (×5): 15 mL via OROMUCOSAL
  Filled 2019-03-11 (×5): qty 15

## 2019-03-11 MED ORDER — ORAL CARE MOUTH RINSE
15.0000 mL | Freq: Two times a day (BID) | OROMUCOSAL | Status: DC
  Administered 2019-03-11 – 2019-03-13 (×5): 15 mL via OROMUCOSAL

## 2019-03-11 MED ORDER — LACTULOSE 10 GM/15ML PO SOLN
30.0000 g | Freq: Every day | ORAL | Status: DC | PRN
  Filled 2019-03-11: qty 45

## 2019-03-11 NOTE — Progress Notes (Signed)
PROGRESS NOTE  Shawn Watkins EGB:[id number] DOB: Aug 04, 1980 DOA: 02/26/2019  PCP: Patient, No Pcp Per  Brief History/Interval Summary: 39 y/o male with a past medical history of type 1 diabetes, alcohol abuse, history of pancreatitis, depression, seizures, seen 1/3 with nausea, vomiting, sore throat and body aches.  Diagnosed with COVID, then left for home AMA.  Returned 1/11 to [facility], in DKA, intubated for delirium and transferred to [facility]. Course complicated by AKI and persistent hypotension.  Reason for Visit: Acute renal failure.  Diabetic ketoacidosis  Consultants: Pulmonology.  Nephrology  Procedures:   Transthoracic echocardiogram 03/04/2019  1. Left ventricular ejection fraction, by visual estimation, is 60 to 65%. The left ventricle has normal function. There is no increased left ventricular wall thickness.  2. Global right ventricle has normal systolc function.The right ventricular size is normal. no increase in right ventricular wall thickness.  3. The mitral valve is normal in structure. Trivial mitral valve regurgitation. No evidence of mitral stenosis.  4. The tricuspid valve was normal in structure. Tricuspid valve regurgitation is mild.  5. Tricuspid valve regurgitation is mild.  6. No evidence of aortic valve sclerosis or stenosis.  7. The inferior vena cava is normal in size with greater than 50% respiratory variability, suggesting right atrial pressure of 3 mmHg.  Antibiotics: Anti-infectives (From admission, onward)   Start     Dose/Rate Route Frequency Ordered Stop   03/10/19 2200  valACYclovir (VALTREX) tablet 1,000 mg     1,000 mg Oral 2 times daily 03/10/19 1009     03/06/19 0500  valACYclovir (VALTREX) tablet 500 mg  Status:  Discontinued     500 mg Oral 2 times daily 03/06/19 0344 03/10/19 1009   03/03/19 0000  ceFEPIme (MAXIPIME) 2 g in sodium chloride 0.9 % 100 mL IVPB     2 g 200 mL/hr over 30 Minutes Intravenous Every 12 hours 03/02/19 1552 03/04/19 1415   02/28/19 1200  ceFEPIme (MAXIPIME) 2 g in sodium chloride 0.9 % 100 mL IVPB  Status:  Discontinued     2 g 200 mL/hr over 30 Minutes Intravenous Every 24 hours 02/27/19 1539 03/02/19 1552   02/26/19 1400  ceFEPIme (MAXIPIME) 2 g in sodium chloride 0.9 % 100 mL IVPB  Status:  Discontinued     2 g 200 mL/hr over 30 Minutes Intravenous Every 12 hours 02/26/19 0912 02/27/19 1539   02/26/19 1200  ceFEPIme (MAXIPIME) 2 g in sodium chloride 0.9 % 100 mL IVPB  Status:  Discontinued     2 g 200 mL/hr over 30 Minutes Intravenous Every 8 hours 02/26/19 0249 02/26/19 0912   02/26/19 1032  vancomycin variable dose per unstable renal function (pharmacist dosing)  Status:  Discontinued      Does not apply See admin instructions 02/26/19 1032 02/28/19 1235   02/26/19 0300  vancomycin (VANCOREADY) IVPB 1750 mg/350 mL  Status:  Discontinued     1,750 mg 175 mL/hr over 120 Minutes Intravenous Every 12 hours 02/26/19 0247 02/26/19 1032   02/26/19 0245  ceFEPIme (MAXIPIME) 2 g in sodium chloride 0.9 % 100 mL IVPB     2 g 200 mL/hr over 30 Minutes Intravenous  Once 02/26/19 0240 02/26/19 0350   02/26/19 0245  metroNIDAZOLE (FLAGYL) IVPB 500 mg     500 mg 100 mL/hr over 60 Minutes Intravenous  Once 02/26/19 0240 02/26/19 0452   02/26/19 0245  vancomycin (VANCOCIN) IVPB 1000 mg/200 mL premix  Status:  Discontinued     1,000  mg 200 mL/hr over 60 Minutes Intravenous  Once 02/26/19 0240 02/26/19 0244   02/26/19 0245  vancomycin (VANCOREADY) IVPB 1500 mg/300 mL  Status:  Discontinued     1,500 mg 150 mL/hr over 120 Minutes Intravenous  Once 02/26/19 0244 02/26/19 0247      Subjective/Interval History: Patient continues to demand escalating doses of his narcotics.  Otherwise he feels well.  Still making urine.  Able to tolerate a soft diet.  Denies any shortness of breath.     Assessment/Plan:  Acute Hypoxic Resp. Failure/Pneumonia due to COVID-19/aspiration pneumonia Patient's chest x-ray showed bilateral  airspace disease.  This was thought to be secondary to COVID-19 along with aspiration.  Patient had to be intubated.  Due to significantly elevated LFTs he was not given remdesivir.  He was treated with antibacterials including vancomycin and cefepime. Patient remains stable from a respiratory standpoint.  Saturating normal on room air.  Continue with incentive spirometry and mobilization.    Acute kidney injury Thought to be secondary to rhabdomyolysis.  He was initially on CRRT.  Off of CRRT since 01/17.  He started making urine.   Creatinine continues to improve.  His potassium was noted to be elevated this morning.  We will recheck this afternoon.  Dialysis catheter and Foley catheter were removed.    Oral ulcers Likely due to acute viral infection.  There also sores on his lips.  Patient started on Valtrex by PCCM which will be continued for now.  He has found it difficult to swallow due to pain.  However this appears to have improved.  Continue soft diet.  Continue with Magic mouthwash.    Generalized body ache Patient was requesting escalating doses of narcotics.  He states that his entire body is hurting.  He was educated about narcotics and potential side effects and why it is not a good idea for him to be on these medications.  Patient however continues to demand these medications.  He was told that these doses will not be increased.  He will be slowly taken off of the fentanyl.  Tylenol is ordered.  Dysphagia Primarily due to sores in his mouth and lips.  Now improved.  Tolerating his diet.     Acute metabolic encephalopathy This was a multifactorial in the setting of DKA, renal failure, COVID-19, alcohol withdrawal.  Seems to have improved.  He was treated with Librium taper.  Continue with thiamine and folic acid.  Diabetes mellitus type 1, uncontrolled with hyperglycemia/DKA on presentation DKA resolved.  Continue SSI Lantus.  CBGs are reasonably well controlled.  Watch for  hypoglycemia.  HbA1c 12.2.    Demand ischemia Patient did have elevated troponin levels.  This was thought to be secondary to demand ischemia from acute illness.  EKG did not show any ischemic changes.  Nonspecific changes were noted.  Echocardiogram was done which does not show any wall motion abnormality.  Normal LV function.  Aspirin was added which will be continued.  He will benefit from seeing cardiology in the outpatient setting.  Acute alcoholic hepatitis LFTs have improved.  Continue to monitor periodically.  Alcohol withdrawal syndrome Improved.  Still remains anxious for which he is on Klonopin.  Continue for now.    Thrombocytopenia and normocytic anemia Low platelet counts most likely alcohol-related along with acute infection.  Platelet counts are now normal.  Hemoglobin is low but stable.  No evidence for overt bleeding.   Pressure injury, present on admission Pressure Injury 02/26/19 Sacrum Right;Left;Medial  Deep Tissue Pressure Injury - Purple or maroon localized area of discolored intact skin or blood-filled blister due to damage of underlying soft tissue from pressure and/or shear. 1/14, wound consult, low (Active)  02/26/19 1200  Location: Sacrum  Location Orientation: Right;Left;Medial  Staging: Deep Tissue Pressure Injury - Purple or maroon localized area of discolored intact skin or blood-filled blister due to damage of underlying soft tissue from pressure and/or shear.  Wound Description (Comments): 1/14, wound consult, low air loss mattress applied  Present on Admission: Yes     Pressure Injury 03/02/19 Lip Medial;Upper Deep Tissue Pressure Injury - Purple or maroon localized area of discolored intact skin or blood-filled blister due to damage of underlying soft tissue from pressure and/or shear. (Active)  03/02/19 1930  Location: Lip  Location Orientation: Medial;Upper  Staging: Deep Tissue Pressure Injury - Purple or maroon localized area of discolored intact  skin or blood-filled blister due to damage of underlying soft tissue from pressure and/or shear.  Wound Description (Comments):   Present on Admission: No     Pressure Injury 03/02/19 Ear Right Stage 2 -  Partial thickness loss of dermis presenting as a shallow open injury with a red, pink wound bed without slough. (Active)  03/02/19 1930  Location: Ear  Location Orientation: Right  Staging: Stage 2 -  Partial thickness loss of dermis presenting as a shallow open injury with a red, pink wound bed without slough.  Wound Description (Comments):   Present on Admission: No     Pressure Injury 03/02/19 Buttocks Right;Left Deep Tissue Pressure Injury - Purple or maroon localized area of discolored intact skin or blood-filled blister due to damage of underlying soft tissue from pressure and/or shear. (Active)  03/02/19 1930  Location: Buttocks  Location Orientation: Right;Left  Staging: Deep Tissue Pressure Injury - Purple or maroon localized area of discolored intact skin or blood-filled blister due to damage of underlying soft tissue from pressure and/or shear.  Wound Description (Comments):   Present on Admission:    Have requested wound care to reevaluate wound so that they can make recommendations for treating it at discharge.  DVT Prophylaxis: SCDs Code Status: Full code Family Communication: Discussed with patient.  Father will be updated today. Disposition Plan: Mobilize.  He states that he will go back home with his father when ready for discharge.   Medications:  Scheduled: . chlorhexidine  15 mL Mouth Rinse BID  . Chlorhexidine Gluconate Cloth  6 each Topical Daily  . clonazePAM  0.5 mg Oral BID  . darbepoetin (ARANESP) injection - NON-DIALYSIS  40 mcg Subcutaneous Q Tue-1800  . feeding supplement (PRO-STAT SUGAR FREE 64)  30 mL Oral BID  . folic acid  1 mg Oral Daily  . influenza vac split quadrivalent PF  0.5 mL Intramuscular Tomorrow-1000  . insulin aspart  0-5 Units  Subcutaneous QHS  . insulin aspart  0-9 Units Subcutaneous TID WC  . insulin glargine  5 Units Subcutaneous Daily  . lactulose  30 g Oral Daily  . magic mouthwash w/lidocaine  10 mL Oral TID  . mouth rinse  15 mL Mouth Rinse q12n4p  . thiamine  100 mg Oral Daily  . valACYclovir  1,000 mg Oral BID   Continuous:  XHB:[id number], dextrose, docusate, fentaNYL (SUBLIMAZE) injection, guaiFENesin, heparin, lip balm, ondansetron (ZOFRAN) IV, oxyCODONE   Objective:  Vital Signs  Vitals:   03/10/19 2313 03/11/19 0455 03/11/19 0609 03/11/19 0729  BP: 132/88 (!) 126/91 118/77 121/89  Pulse: 62  60 64 (!) 56  Resp:  20 18 18   Temp:  (!) 97.3 F (36.3 C) 98.7 F (37.1 C) 98.1 F (36.7 C)  TempSrc:  Axillary Oral Oral  SpO2: 98% 99% 95% 96%  Weight:      Height:        Intake/Output Summary (Last 24 hours) at 03/11/2019 1419 Last data filed at 03/11/2019 0000 Gross per 24 hour  Intake 480 ml  Output --  Net 480 ml   Filed Weights   03/06/19 0500 03/07/19 0500 03/08/19 0332  Weight: 97 kg 94.2 kg 94 kg    General appearance: Awake alert.  In no distress Resp: Clear to auscultation bilaterally.  Normal effort Cardio: S1-S2 is normal regular.  No S3-S4.  No rubs murmurs or bruit GI: Abdomen is soft.  Nontender nondistended.  Bowel sounds are present normal.  No masses organomegaly Extremities: No edema.   Neurologic: Alert and oriented x3.  No focal neurological deficits.     Lab Results:  Data Reviewed: I have personally reviewed following labs and imaging studies  CBC: Recent Labs  Lab 03/05/19 0520 03/08/19 0417 03/09/19 0108 03/10/19 0542 03/11/19 0628  WBC 9.5 8.9 11.9* 9.1 7.9  HGB 7.7* 7.5* 8.4* 7.6* 7.2*  HCT 21.8* 22.3* 24.8* 23.8* 22.2*  MCV 84.5 86.1 87.3 91.2 88.8  PLT 65* 120* 158 170 099    Basic Metabolic Panel: Recent Labs  Lab 03/05/19 0520 03/05/19 0520 03/06/19 0510 03/06/19 0510 03/07/19 0454 03/08/19 0417 03/09/19 0108  03/10/19 0542 03/11/19 0628  NA 140   < > 143   < > 144 145 145 140 137  K 4.0   < > 3.9   < > 3.9 3.8 3.9 4.1 5.4*  CL 107   < > 109   < > 110 113* 112* 109 108  CO2 25   < > 24   < > 23 23 23 23 22   GLUCOSE 148*   < > 151*   < > 121* 222* 141* 133* 236*  BUN 50*   < > 54*   < > 60* 62* 55* 49* 46*  CREATININE 3.53*   < > 4.63*   < > 4.63* 4.10* 3.43* 2.71* 2.03*  CALCIUM 7.0*   < > 7.3*   < > 7.3* 7.3* 7.9* 7.8* 7.7*  PHOS 3.5  --  3.6  --  4.0  --   --   --   --    < > = values in this interval not displayed.    GFR: Estimated Creatinine Clearance: 56.3 mL/min (A) (by C-G formula based on SCr of 2.03 mg/dL (H)).  Liver Function Tests: Recent Labs  Lab 03/07/19 0454 03/08/19 0417 03/09/19 0108 03/10/19 0542 03/11/19 0628  AST 44* 31 31 26 23   ALT 72* 57* 58* 44 36  ALKPHOS 278* 247* 247* 216* 188*  BILITOT 1.0 0.7 0.8 0.7 0.4  PROT 5.2* 5.1* 6.1* 5.8* 5.6*  ALBUMIN 2.0*  2.1* 2.0* 2.4* 2.3* 2.1*     CBG: Recent Labs  Lab 03/10/19 1728 03/10/19 1915 03/10/19 2127 03/11/19 0724 03/11/19 1142  GLUCAP 81 120* 79 185* 133*     No results found for this or any previous visit (from the past 240 hour(s)).    Radiology Studies: No results found.     LOS: 13 days   Luis Nickles [employer] on [email or web address]  03/11/2019, 2:19 PM

## 2019-03-11 NOTE — Plan of Care (Signed)
Dressing change to sacrum performed, yellowish drainage from the site observed.

## 2019-03-12 DIAGNOSIS — E875 Hyperkalemia: Secondary | ICD-10-CM

## 2019-03-12 LAB — COMPREHENSIVE METABOLIC PANEL
ALT: 34 U/L (ref 0–44)
AST: 23 U/L (ref 15–41)
Albumin: 2.4 g/dL — ABNORMAL LOW (ref 3.5–5.0)
Alkaline Phosphatase: 186 U/L — ABNORMAL HIGH (ref 38–126)
Anion gap: 7 (ref 5–15)
BUN: 42 mg/dL — ABNORMAL HIGH (ref 6–20)
CO2: 24 mmol/L (ref 22–32)
Calcium: 8 mg/dL — ABNORMAL LOW (ref 8.9–10.3)
Chloride: 105 mmol/L (ref 98–111)
Creatinine, Ser: 1.71 mg/dL — ABNORMAL HIGH (ref 0.61–1.24)
GFR calc Af Amer: 58 mL/min — ABNORMAL LOW (ref >60)
GFR calc non Af Amer: 50 mL/min — ABNORMAL LOW (ref >60)
Glucose, Bld: 239 mg/dL — ABNORMAL HIGH (ref 70–99)
Potassium: 5.5 mmol/L — ABNORMAL HIGH (ref 3.5–5.1)
Sodium: 136 mmol/L (ref 135–145)
Total Bilirubin: 0.5 mg/dL (ref 0.3–1.2)
Total Protein: 6.1 g/dL — ABNORMAL LOW (ref 6.5–8.1)

## 2019-03-12 LAB — GLUCOSE, CAPILLARY
Glucose-Capillary: 239 mg/dL — ABNORMAL HIGH (ref 70–99)
Glucose-Capillary: 256 mg/dL — ABNORMAL HIGH (ref 70–99)
Glucose-Capillary: 155 mg/dL — ABNORMAL HIGH (ref 70–99)
Glucose-Capillary: 162 mg/dL — ABNORMAL HIGH (ref 70–99)
Glucose-Capillary: 270 mg/dL — ABNORMAL HIGH (ref 70–99)
Glucose-Capillary: 129 mg/dL — ABNORMAL HIGH (ref 70–99)
Glucose-Capillary: 186 mg/dL — ABNORMAL HIGH (ref 70–99)
Glucose-Capillary: 248 mg/dL — ABNORMAL HIGH (ref 70–99)
Glucose-Capillary: 284 mg/dL — ABNORMAL HIGH (ref 70–99)

## 2019-03-12 LAB — CBC
HCT: 25.2 % — ABNORMAL LOW (ref 39.0–52.0)
Hemoglobin: 8.5 g/dL — ABNORMAL LOW (ref 13.0–17.0)
MCH: 29.8 pg (ref 26.0–34.0)
MCHC: 33.7 g/dL (ref 30.0–36.0)
MCV: 88.4 fL (ref 80.0–100.0)
Platelets: 271 10*3/uL (ref 150–400)
RBC: 2.85 MIL/uL — ABNORMAL LOW (ref 4.22–5.81)
RDW: 14.2 % (ref 11.5–15.5)
WBC: 9.8 10*3/uL (ref 4.0–10.5)
nRBC: 0 % (ref 0.0–0.2)

## 2019-03-12 LAB — POTASSIUM
Potassium: 5.5 mmol/L — ABNORMAL HIGH (ref 3.5–5.1)
Potassium: 5.3 mmol/L — ABNORMAL HIGH (ref 3.5–5.1)

## 2019-03-12 MED ORDER — FENTANYL CITRATE (PF) 100 MCG/2ML IJ SOLN
12.5000 ug | Freq: Four times a day (QID) | INTRAMUSCULAR | Status: DC | PRN
  Administered 2019-03-12 – 2019-03-13 (×5): 12.5 ug via INTRAVENOUS
  Filled 2019-03-12 (×5): qty 2

## 2019-03-12 MED ORDER — SODIUM POLYSTYRENE SULFONATE 15 GM/60ML PO SUSP
30.0000 g | Freq: Once | ORAL | Status: AC
  Administered 2019-03-12: 30 g via ORAL
  Filled 2019-03-12: qty 120

## 2019-03-12 MED ORDER — SODIUM ZIRCONIUM CYCLOSILICATE 10 G PO PACK
10.0000 g | PACK | Freq: Once | ORAL | Status: AC
  Administered 2019-03-12: 10 g via ORAL
  Filled 2019-03-12: qty 1

## 2019-03-12 MED ORDER — COLLAGENASE 250 UNIT/GM EX OINT
TOPICAL_OINTMENT | Freq: Every day | CUTANEOUS | Status: DC
  Filled 2019-03-12: qty 30

## 2019-03-12 NOTE — Progress Notes (Signed)
      03/12/19 0444  Pressure Injury 03/02/19 Buttocks Right;Left Deep Tissue Pressure Injury - Purple or maroon localized area of discolored intact skin or blood-filled blister due to damage of underlying soft tissue from pressure and/or shear.  Date First Assessed/Time First Assessed: 03/02/19 1930   Location: Buttocks  Location Orientation: Right;Left  Staging: Deep Tissue Pressure Injury - Purple or maroon localized area of discolored intact skin or blood-filled blister due to damage of un...  Wound Image Images linked

## 2019-03-12 NOTE — Consult Note (Signed)
WOC Nurse Consult Note: Consult performed remotely after the pictures reviewed and progress notes.  WOC team previously assessed wound on 03/01/19 which has continued to decline.   Patient receiving care at [facility] Reason for Consult:Re-evaluation of sacral wound and medication recommendation.  Wound type:Previous deep tissue injury that has evolved into an un-stageable.  Santyl order added in order to debride the non-viable tissue. Pressure Injury POA: Yes Dressing procedure/placement/frequency: Apply Santyl daily (dime size amount ) to wound top with moist to dry gauze and cover with foam.    Monitor the wound area's for worsening of condition such as, Signs/symptoms of infection, Increase in size, development of or worsening of odor, development of pain, or increased pain at the affected locations.  Notify the medical team if any of these develop.  Thank you for the consult.  WOC nurse will not follow at this time.  Please re-consult the WOC if needed.    Silvio Pate, RN, [facility]

## 2019-03-12 NOTE — Progress Notes (Signed)
  PT Cancellation Note  Patient Details Name: Shawn Watkins MRN: [id number] DOB: 1980/11/24   Cancelled Treatment:    Reason Eval/Treat Not Completed: Pain limiting ability to participate.    Rada Hay 03/12/2019, 5:49 PM Blanchard Kelch PT Acute Rehabilitation Services Pager [phone] Office [phone]

## 2019-03-12 NOTE — Progress Notes (Signed)
PROGRESS NOTE  Shawn Watkins EGB:[id number] DOB: Aug 04, 1980 DOA: 02/26/2019  PCP: Patient, No Pcp Per  Brief History/Interval Summary: 39 y/o male with a past medical history of type 1 diabetes, alcohol abuse, history of pancreatitis, depression, seizures, seen 1/3 with nausea, vomiting, sore throat and body aches.  Diagnosed with COVID, then left for home AMA.  Returned 1/11 to [facility], in DKA, intubated for delirium and transferred to [facility]. Course complicated by AKI and persistent hypotension.  Reason for Visit: Acute renal failure.  Diabetic ketoacidosis  Consultants: Pulmonology.  Nephrology  Procedures:   Transthoracic echocardiogram 03/04/2019  1. Left ventricular ejection fraction, by visual estimation, is 60 to 65%. The left ventricle has normal function. There is no increased left ventricular wall thickness.  2. Global right ventricle has normal systolc function.The right ventricular size is normal. no increase in right ventricular wall thickness.  3. The mitral valve is normal in structure. Trivial mitral valve regurgitation. No evidence of mitral stenosis.  4. The tricuspid valve was normal in structure. Tricuspid valve regurgitation is mild.  5. Tricuspid valve regurgitation is mild.  6. No evidence of aortic valve sclerosis or stenosis.  7. The inferior vena cava is normal in size with greater than 50% respiratory variability, suggesting right atrial pressure of 3 mmHg.  Antibiotics: Anti-infectives (From admission, onward)   Start     Dose/Rate Route Frequency Ordered Stop   03/10/19 2200  valACYclovir (VALTREX) tablet 1,000 mg     1,000 mg Oral 2 times daily 03/10/19 1009     03/06/19 0500  valACYclovir (VALTREX) tablet 500 mg  Status:  Discontinued     500 mg Oral 2 times daily 03/06/19 0344 03/10/19 1009   03/03/19 0000  ceFEPIme (MAXIPIME) 2 g in sodium chloride 0.9 % 100 mL IVPB     2 g 200 mL/hr over 30 Minutes Intravenous Every 12 hours 03/02/19 1552 03/04/19 1415   02/28/19 1200  ceFEPIme (MAXIPIME) 2 g in sodium chloride 0.9 % 100 mL IVPB  Status:  Discontinued     2 g 200 mL/hr over 30 Minutes Intravenous Every 24 hours 02/27/19 1539 03/02/19 1552   02/26/19 1400  ceFEPIme (MAXIPIME) 2 g in sodium chloride 0.9 % 100 mL IVPB  Status:  Discontinued     2 g 200 mL/hr over 30 Minutes Intravenous Every 12 hours 02/26/19 0912 02/27/19 1539   02/26/19 1200  ceFEPIme (MAXIPIME) 2 g in sodium chloride 0.9 % 100 mL IVPB  Status:  Discontinued     2 g 200 mL/hr over 30 Minutes Intravenous Every 8 hours 02/26/19 0249 02/26/19 0912   02/26/19 1032  vancomycin variable dose per unstable renal function (pharmacist dosing)  Status:  Discontinued      Does not apply See admin instructions 02/26/19 1032 02/28/19 1235   02/26/19 0300  vancomycin (VANCOREADY) IVPB 1750 mg/350 mL  Status:  Discontinued     1,750 mg 175 mL/hr over 120 Minutes Intravenous Every 12 hours 02/26/19 0247 02/26/19 1032   02/26/19 0245  ceFEPIme (MAXIPIME) 2 g in sodium chloride 0.9 % 100 mL IVPB     2 g 200 mL/hr over 30 Minutes Intravenous  Once 02/26/19 0240 02/26/19 0350   02/26/19 0245  metroNIDAZOLE (FLAGYL) IVPB 500 mg     500 mg 100 mL/hr over 60 Minutes Intravenous  Once 02/26/19 0240 02/26/19 0452   02/26/19 0245  vancomycin (VANCOCIN) IVPB 1000 mg/200 mL premix  Status:  Discontinued     1,000  mg 200 mL/hr over 60 Minutes Intravenous  Once 02/26/19 0240 02/26/19 0244   02/26/19 0245  vancomycin (VANCOREADY) IVPB 1500 mg/300 mL  Status:  Discontinued     1,500 mg 150 mL/hr over 120 Minutes Intravenous  Once 02/26/19 0244 02/26/19 0247      Subjective/Interval History: Patient states that he does not want to go home today.  He was told that once we are able to correct his potassium he should be ready for discharge.  Patient was reassured.  He was told that his symptoms will eventually get better but that he is stable for discharge otherwise.     Assessment/Plan:  Acute Hypoxic  Resp. Failure/Pneumonia due to COVID-19/aspiration pneumonia Patient's chest x-ray showed bilateral airspace disease.  This was thought to be secondary to COVID-19 along with aspiration.  Patient had to be intubated.  Due to significantly elevated LFTs he was not given remdesivir.  He was treated with antibacterials including vancomycin and cefepime. Patient remains stable from a respiratory standpoint.    Acute kidney injury/hyperkalemia Thought to be secondary to rhabdomyolysis.  He was initially on CRRT.  Off of CRRT since 01/17.  He started making urine.  Creatinine has been improving daily.  He was taken off of IV fluids yesterday.  For unclear reason his potassium was noted to be elevated again.  He is not getting any potassium supplements.  He was given Lokelma without any significant changes in his level.  He will be given Kayexalate.  Recheck potassium later today.  Oral ulcers Likely due to acute viral infection.  There also sores on his lips.  Patient started on Valtrex by PCCM which will be continued for now.  He has found it difficult to swallow due to pain.  However this appears to have improved.  Continue soft diet.  Continue with Magic mouthwash.  Patient told that his source will get better over time.  Generalized body ache Patient was requesting escalating doses of narcotics.  He states that his entire body is hurting.  He was educated about narcotics and potential side effects and why it is not a good idea for him to be on these medications.  Patient however continues to demand these medications.  He was told that these doses will not be increased.  He will be slowly taken off of the fentanyl.  Tylenol is ordered.  Dysphagia Primarily due to sores in his mouth and lips.  Now improved.  Tolerating his diet.     Acute metabolic encephalopathy This was a multifactorial in the setting of DKA, renal failure, COVID-19, alcohol withdrawal.  Seems to have improved.  He was treated with  Librium taper.  Continue with thiamine and folic acid.  Diabetes mellitus type 1, uncontrolled with hyperglycemia/DKA on presentation DKA resolved.  Continue SSI Lantus.  CBGs are reasonably well controlled.  Watch for hypoglycemia.  HbA1c 12.2.    Demand ischemia Patient did have elevated troponin levels.  This was thought to be secondary to demand ischemia from acute illness.  EKG did not show any ischemic changes.  Nonspecific changes were noted.  Echocardiogram was done which does not show any wall motion abnormality.  Normal LV function.  Aspirin was added which will be continued.  He will benefit from seeing cardiology in the outpatient setting.  Acute alcoholic hepatitis LFTs have improved.  Continue to monitor periodically.  Alcohol withdrawal syndrome Improved.  Still remains anxious for which he is on Klonopin.  Continue for now.  Thrombocytopenia and normocytic anemia Low platelet counts most likely alcohol-related along with acute infection.  Platelet counts are now normal.  Hemoglobin is low but stable.  No evidence for overt bleeding.   Pressure injury, present on admission Pressure Injury 02/26/19 Sacrum Right;Left;Medial Unstageable - Full thickness tissue loss in which the base of the injury is covered by slough (yellow, tan, gray, green or brown) and/or eschar (tan, brown or black) in the wound bed. 03/12/19 previous dee (Active)  02/26/19 1200  Location: Sacrum  Location Orientation: Right;Left;Medial  Staging: Unstageable - Full thickness tissue loss in which the base of the injury is covered by slough (yellow, tan, gray, green or brown) and/or eschar (tan, brown or black) in the wound bed.  Wound Description (Comments): 03/12/19 previous deep tissue injury has evolved to unstageable.    Present on Admission: Yes     Pressure Injury 03/02/19 Lip Medial;Upper Deep Tissue Pressure Injury - Purple or maroon localized area of discolored intact skin or blood-filled blister  due to damage of underlying soft tissue from pressure and/or shear. (Active)  03/02/19 1930  Location: Lip  Location Orientation: Medial;Upper  Staging: Deep Tissue Pressure Injury - Purple or maroon localized area of discolored intact skin or blood-filled blister due to damage of underlying soft tissue from pressure and/or shear.  Wound Description (Comments):   Present on Admission: No     Pressure Injury 03/02/19 Ear Right Stage 2 -  Partial thickness loss of dermis presenting as a shallow open injury with a red, pink wound bed without slough. (Active)  03/02/19 1930  Location: Ear  Location Orientation: Right  Staging: Stage 2 -  Partial thickness loss of dermis presenting as a shallow open injury with a red, pink wound bed without slough.  Wound Description (Comments):   Present on Admission: No   Santyl ointment with foam dressing.  May also benefit from bacitracin ointment at discharge.    DVT Prophylaxis: SCDs Code Status: Full code Family Communication: Father was updated yesterday.  Will call again today. Disposition Plan: Mobilize.  He states that he will go back home with his father when ready for discharge.   Medications:  Scheduled: . chlorhexidine  15 mL Mouth Rinse BID  . Chlorhexidine Gluconate Cloth  6 each Topical Daily  . clonazePAM  0.5 mg Oral BID  . collagenase   Topical Daily  . darbepoetin (ARANESP) injection - NON-DIALYSIS  40 mcg Subcutaneous Q Tue-1800  . docusate sodium  100 mg Oral BID  . folic acid  1 mg Oral Daily  . influenza vac split quadrivalent PF  0.5 mL Intramuscular Tomorrow-1000  . insulin aspart  0-5 Units Subcutaneous QHS  . insulin aspart  0-9 Units Subcutaneous TID WC  . insulin glargine  5 Units Subcutaneous Daily  . magic mouthwash w/lidocaine  10 mL Oral TID  . mouth rinse  15 mL Mouth Rinse q12n4p  . sodium polystyrene  30 g Oral Once  . thiamine  100 mg Oral Daily  . valACYclovir  1,000 mg Oral BID    Continuous:  TZG:[id number], dextrose, fentaNYL (SUBLIMAZE) injection, guaiFENesin, heparin, lactulose, lip balm, ondansetron (ZOFRAN) IV, oxyCODONE   Objective:  Vital Signs  Vitals:   03/12/19 0218 03/12/19 0225 03/12/19 0820 03/12/19 1551  BP: 123/75  126/82 123/79  Pulse: 76  63 70  Resp: 20  (!) 21 20  Temp: 97.6 F (36.4 C)  98 F (36.7 C) 97.7 F (36.5 C)  TempSrc: Oral  Oral Oral  SpO2: 96%  99% 95%  Weight:  83.1 kg    Height:        Intake/Output Summary (Last 24 hours) at 03/12/2019 1556 Last data filed at 03/12/2019 0400 Gross per 24 hour  Intake 480 ml  Output 0 ml  Net 480 ml   Filed Weights   03/07/19 0500 03/08/19 0332 03/12/19 0225  Weight: 94.2 kg 94 kg 83.1 kg    General appearance: Awake alert.  In no distress Resp: Clear to auscultation bilaterally.  Normal effort Cardio: S1-S2 is normal regular.  No S3-S4.  No rubs murmurs or bruit GI: Abdomen is soft.  Nontender nondistended.  Bowel sounds are present normal.  No masses organomegaly    Lab Results:  Data Reviewed: I have personally reviewed following labs and imaging studies  CBC: Recent Labs  Lab 03/08/19 0417 03/09/19 0108 03/10/19 0542 03/11/19 0628 03/12/19 0557  WBC 8.9 11.9* 9.1 7.9 9.8  HGB 7.5* 8.4* 7.6* 7.2* 8.5*  HCT 22.3* 24.8* 23.8* 22.2* 25.2*  MCV 86.1 87.3 91.2 88.8 88.4  PLT 120* 158 170 206 124    Basic Metabolic Panel: Recent Labs  Lab 03/06/19 0510 03/06/19 0510 03/07/19 0454 03/07/19 0454 03/08/19 0417 03/08/19 0417 03/09/19 0108 03/09/19 0108 03/10/19 0542 03/11/19 0628 03/11/19 1450 03/12/19 0557 03/12/19 1420  NA 143   < > 144   < > 145  --  145  --  140 137  --  136  --   K 3.9   < > 3.9   < > 3.8   < > 3.9   < > 4.1 5.4* 5.0 5.5* 5.5*  CL 109   < > 110   < > 113*  --  112*  --  109 108  --  105  --   CO2 24   < > 23   < > 23  --  23  --  23 22  --  24  --   GLUCOSE 151*   < > 121*   < > 222*  --  141*  --  133* 236*  --  239*  --    BUN 54*   < > 60*   < > 62*  --  55*  --  49* 46*  --  42*  --   CREATININE 4.63*   < > 4.63*   < > 4.10*  --  3.43*  --  2.71* 2.03*  --  1.71*  --   CALCIUM 7.3*   < > 7.3*   < > 7.3*  --  7.9*  --  7.8* 7.7*  --  8.0*  --   PHOS 3.6  --  4.0  --   --   --   --   --   --   --   --   --   --    < > = values in this interval not displayed.    GFR: Estimated Creatinine Clearance: 66.8 mL/min (A) (by C-G formula based on SCr of 1.71 mg/dL (H)).  Liver Function Tests: Recent Labs  Lab 03/08/19 0417 03/09/19 0108 03/10/19 0542 03/11/19 0628 03/12/19 0557  AST 31 31 26 23 23   ALT 57* 58* 44 36 34  ALKPHOS 247* 247* 216* 188* 186*  BILITOT 0.7 0.8 0.7 0.4 0.5  PROT 5.1* 6.1* 5.8* 5.6* 6.1*  ALBUMIN 2.0* 2.4* 2.3* 2.1* 2.4*     CBG: Recent Labs  Lab 03/11/19 1142 03/11/19 1532 03/11/19 2131 03/12/19  0756 03/12/19 1208  GLUCAP 133* 85 162* 270* 129*     No results found for this or any previous visit (from the past 240 hour(s)).    Radiology Studies: No results found.     LOS: 14 days   Laurell Coalson [employer] on [email or web address]  03/12/2019, 3:56 PM

## 2019-03-13 LAB — BASIC METABOLIC PANEL
Anion gap: 8 (ref 5–15)
BUN: 35 mg/dL — ABNORMAL HIGH (ref 6–20)
CO2: 22 mmol/L (ref 22–32)
Calcium: 7.6 mg/dL — ABNORMAL LOW (ref 8.9–10.3)
Chloride: 107 mmol/L (ref 98–111)
Creatinine, Ser: 1.32 mg/dL — ABNORMAL HIGH (ref 0.61–1.24)
GFR calc Af Amer: >60 mL/min (ref >60)
GFR calc non Af Amer: >60 mL/min (ref >60)
Glucose, Bld: 179 mg/dL — ABNORMAL HIGH (ref 70–99)
Potassium: 4.6 mmol/L (ref 3.5–5.1)
Sodium: 137 mmol/L (ref 135–145)

## 2019-03-13 LAB — GLUCOSE, CAPILLARY
Glucose-Capillary: 165 mg/dL — ABNORMAL HIGH (ref 70–99)
Glucose-Capillary: 170 mg/dL — ABNORMAL HIGH (ref 70–99)
Glucose-Capillary: 196 mg/dL — ABNORMAL HIGH (ref 70–99)

## 2019-03-13 MED ORDER — MAGIC MOUTHWASH W/LIDOCAINE: 10.0000 mL | Freq: Three times a day (TID) | ORAL | 0 refills | Status: AC

## 2019-03-13 MED ORDER — OXYCODONE HCL 5 MG PO TABS: 5.0000 mg | ORAL_TABLET | Freq: Four times a day (QID) | ORAL | 0 refills | Status: AC | PRN

## 2019-03-13 MED ORDER — INSULIN GLARGINE 100 UNIT/ML ~~LOC~~ SOLN: 8.0000 [IU] | Freq: Every day | SUBCUTANEOUS | 2 refills | Status: AC

## 2019-03-13 MED ORDER — DOUBLE ANTIBIOTIC 500-10000 UNIT/GM EX OINT: 1.0000 "application " | TOPICAL_OINTMENT | Freq: Every day | CUTANEOUS | 0 refills | Status: AC

## 2019-03-13 MED ORDER — ALPRAZOLAM 0.5 MG PO TABS: 0.5000 mg | ORAL_TABLET | Freq: Three times a day (TID) | ORAL | 0 refills | Status: AC | PRN

## 2019-03-13 MED ORDER — ENSURE ENLIVE PO LIQD
237.0000 mL | Freq: Three times a day (TID) | ORAL | Status: DC
  Administered 2019-03-13: 237 mL via ORAL

## 2019-03-13 MED ORDER — COLLAGENASE 250 UNIT/GM EX OINT: TOPICAL_OINTMENT | Freq: Every day | CUTANEOUS | 0 refills | Status: AC

## 2019-03-13 MED ORDER — "INSULIN SYRINGE/NEEDLE 28G X 1/2"" 0.5 ML MISC": 1 refills | Status: AC

## 2019-03-13 NOTE — Progress Notes (Signed)
Nutrition Follow-up  RD working remotely.  DOCUMENTATION CODES:   Not applicable  INTERVENTION:    Ensure Enlive po TID, each supplement provides 350 kcal and 20 grams of protein. *Recommend add insulin coverage between meals for coverage of Ensure supplements.   Encourage PO's.  Add jello, ice cream, milk to meal trays per patient request.  Pt receiving Hormel Shake daily with Breakfast which provides 520 kcals and 22 g of protein and Magic cup BID with lunch and dinner, each supplement provides 290 kcal and 9 grams of protein, automatically on meal trays to optimize nutritional intake.   NUTRITION DIAGNOSIS:   Increased nutrient needs related to acute illness(COVID) as evidenced by estimated needs.  Ongoing   GOAL:   Patient will meet greater than or equal to 90% of their needs  Progressing  MONITOR:   PO intake, Supplement acceptance, Labs, Skin  ASSESSMENT:   39 yo male admitted with AMS and increased WOB, DKA. Required intubation on admission. COVID positive on 1/3. PMH includes DM (on insulin at home), ETOH abuse, pancreatitis, depression, alcoholic hepatitis.  Cortrak was placed on 1/20, removed on 1/21 due to patient request. Diet was advanced to soft on 1/22. RN reports that patient consumed ~50% of breakfast today. He is requesting more jello, ice cream, and milk, will add to meal trays. Will also add Ensure supplements between meals.   Labs reviewed. BUN 35 (H), creatinine 1.32 (H), both trending down CBG's: [phone]   Medications reviewed and include colace, folic acid, novolog, lantus, thiamine, magic mouthwash.  Weight 83.1 kg on 1/25, approaching admission weight of 81.6 kg  Diet Order:   Diet Order            DIET SOFT Room service appropriate? Yes; Fluid consistency: Thin  Diet effective now              EDUCATION NEEDS:   Not appropriate for education at this time  Skin:   DTI: lip Stage II: R ear Unstageable:  sacrum  Last BM:  1/25  Height:   Ht Readings from Last 1 Encounters:  02/26/19 6\' 1"  (1.854 m)    Weight:   Wt Readings from Last 1 Encounters:  03/12/19 83.1 kg    Ideal Body Weight:  83.6 kg  BMI:  Body mass index is 24.17 kg/m.  Estimated Nutritional Needs:   Kcal:  2600-2900  Protein:  120-135 gm  Fluid:  >/= 2.4 L    03/14/19, RD, LDN, CNSC Pager [phone] After Hours Pager [phone]

## 2019-03-13 NOTE — Progress Notes (Signed)
Inpatient Diabetes Program Recommendations  AACE/ADA: New Consensus Statement on Inpatient Glycemic Control (2015)  Target Ranges:  Prepandial:   less than 140 mg/dL      Peak postprandial:   less than 180 mg/dL (1-2 hours)      Critically ill patients:  140 - 180 mg/dL   Lab Results  Component Value Date   GLUCAP 170 (H) 03/13/2019   HGBA1C 12.2 (H) 02/27/2019    Review of Glycemic Control Results for Shawn Watkins, Shawn Watkins (MRN [id number]) as of 03/13/2019 14:35  Ref. Range 03/12/2019 20:38 03/12/2019 22:02 03/13/2019 07:45 03/13/2019 12:07  Glucose-Capillary Latest Ref Range: 70 - 99 mg/dL 662 (H) 947 (H) 654 (H) 170 (H)   Diabetes history: DM 1 Outpatient Diabetes medications:  Lantus 15 units q HS, Admelog 5 units tid with meals Current orders for Inpatient glycemic control:  Novolog sensitive tid with meals and HS Lantus 5 units daily  Inpatient Diabetes Program Recommendations:    Patient admitted with COVID-19 and severe DKA.  Called and spoke with patient by phone regarding whether he still has insulin at home and his needs related to diabetes. Patient states that he is in a lot of pain and is having a difficult time concentrating.  Asked if he has insulin at home.  He states that he "is not sure".  He does not have insurance and states that he was in [place] the last time his insulin was filled. Reminded him that he needs to take insulin consistently to prevent DKA.  He states that he struggles at times knowing how much to take. Needs f/u with PCP and assistance with getting medications. Explained that diabetes management is important.  Patient distracted and continues to complain of pain.  Notified care manager, RN, and MD.    Thanks,  Beryl Meager, RN, BC-ADM Inpatient Diabetes Coordinator Pager [phone] (8a-5p)

## 2019-03-13 NOTE — Progress Notes (Signed)
Physical Therapy Treatment Patient Details Name: Shawn Watkins MRN: [id number] DOB: [birth year] Today's Date: 03/13/2019    History of Present Illness 39 y.o. male admitted on 02/26/19 for for DKA, ETOH 215, COVID-19.  Was dx 02/18/19 with COVID, however, left [facility] ED AMA.  He was intubated at [facility] on 02/26/19 and transferred to [place] for further care.  He was started on CRRT on 03/02/19, hypotensive and bradycardic.  Pt extubated 03/03/18. Pt with significant PMH of alcoholism (per father 74 years), DM2, depression, alcoholic hepatitis.      PT Comments    Pleasant and cooperative male patient that is scheduled for discharge later today, home with his parents. He has opted and requested Rehab related to ETOH abuse. His HS are tight bilaterally, and he indicates that his buttocks are very sore from a pressure wound related to when he was on ventilator. He was able to perform basic LE ex in chair, but unable to tolerate sitting comfortable. Ambulated in hallway, steady with close supervision, no LOB. He does not present with any ongoing needs from PT once discharged.  Follow Up Recommendations        Equipment Recommendations  None recommended by PT    Recommendations for Other Services Rehab consult     Precautions / Restrictions Precautions Precautions: Fall Precaution Comments: pressure area buttocks--geo mat in room   He was unable to sit comfortably in BS chair due to wound Restrictions Weight Bearing Restrictions: No Other Position/Activity Restrictions: He agreed to ambulation and simple LE exercises    Mobility  Bed Mobility Overal bed mobility: Independent Bed Mobility: Supine to Sit;Sit to Supine     Supine to sit: Modified independent (Device/Increase time) Sit to supine: Modified independent (Device/Increase time)      Transfers Overall transfer level: Modified independent Equipment used: None   Sit to Stand: Supervision             Ambulation/Gait Ambulation/Gait assistance: Supervision Gait Distance (Feet): 200 Feet Assistive device: None Gait Pattern/deviations: WFL(Within Functional Limits)   Gait velocity interpretation: >2.62 ft/sec, indicative of community ambulatory     Stairs             Wheelchair Mobility    Modified Rankin (Stroke Patients Only)       Balance Overall balance assessment: Mild deficits observed, not formally tested Sitting-balance support: Feet supported;Bilateral upper extremity supported Sitting balance-Leahy Scale: Good       Standing balance-Leahy Scale: Good                              Cognition Arousal/Alertness: Awake/alert Behavior During Therapy: Flat affect Overall Cognitive Status: Impaired/Different from baseline Area of Impairment: Orientation;Memory;Awareness                       Following Commands: Follows multi-step commands consistently   Awareness: Anticipatory   General Comments: 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- did not mention anything about his background in ETOH abuse      Exercises Total Joint Exercises Ankle Circles/Pumps: AROM;Seated Quad Sets: AROM;Seated Hip ABduction/ADduction: AROM;Seated Straight Leg Raises: AROM;Seated Marching in Standing: Seated;AROM    General Comments        Pertinent Vitals/Pain Pain Location: mouth; stomach, bottom, "everything" Pain Descriptors / Indicators: Aching(He staes that his buttocks hurt so bad from the wound (pressure) from when he was on ventilator)  Home Living                      Prior Function            PT Goals (current goals can now be found in the care plan section) Acute Rehab PT Goals Patient Stated Goal: to go to alcohol rehab PT Goal Formulation: With patient/family Time For Goal Achievement: 03/19/19 Potential to Achieve Goals: Good Progress towards PT goals: Progressing toward goals     Frequency    Min 3X/week      PT Plan Current plan remains appropriate    Co-evaluation              AM-PAC PT "6 Clicks" Mobility   Outcome Measure  Help needed turning from your back to your side while in a flat bed without using bedrails?: None Help needed moving from lying on your back to sitting on the side of a flat bed without using bedrails?: None Help needed moving to and from a bed to a chair (including a wheelchair)?: None Help needed standing up from a chair using your arms (e.g., wheelchair or bedside chair)?: None Help needed to walk in hospital room?: None Help needed climbing 3-5 steps with a railing? : A Little 6 Click Score: 23    End of Session     Patient left: in bed;with call bell/phone within reach;with nursing/sitter in room   PT Visit Diagnosis: Muscle weakness (generalized) (M62.81)(HS are tight bilaterally)     Time: 5053-9767 PT Time Calculation (min) (ACUTE ONLY): 38 min  Charges:  $Gait Training: 8-22 mins $Therapeutic Exercise: 8-22 mins                    Jaeden Messer P, PT # [phone] CGV cell   Ephriam Jenkins 03/13/2019, 1:50 PM

## 2019-03-13 NOTE — Progress Notes (Signed)
Inpatient Diabetes Program Recommendations  AACE/ADA: New Consensus Statement on Inpatient Glycemic Control (2015)  Target Ranges:  Prepandial:   less than 140 mg/dL      Peak postprandial:   less than 180 mg/dL (1-2 hours)      Critically ill patients:  140 - 180 mg/dL   Lab Results  Component Value Date   GLUCAP 170 (H) 03/13/2019   HGBA1C 12.2 (H) 02/27/2019   Results for Shawn Watkins, Shawn Watkins (MRN [id number]) as of 03/13/2019 13:47  Ref. Range 07/21/2016 04:41 02/06/2017 13:10 02/27/2019 15:30  Hemoglobin A1C Latest Ref Range: 4.8 - 5.6 % 8.4 (H) 9.8 (H) 12.2 (H)   Review of Glycemic Control  Diabetes history: DM Type 2, Diagnosed 07/2016 Outpatient Diabetes medications: Lantus 15 units qhs, Admelog 5 units tid meal coverage Current orders for Inpatient glycemic control:  Novolog 0-9 units tid + hs Lantus 5 units Daily  Ensure Enlive tid between meals BUN/Creat: 35/1.32  For d/c would continue the Admelog insulin on a sliding scale similar to our Novolog Sensitive scale instead of a set meal coverage dose.  Attempted to call pt x 3 in room to discuss A1c level. Was able to speak with RN who who will check pts room phone, phone placed in room according to charge RN.   Tried calling pt 2 more times.   Thanks,  Christena Deem RN, MSN, BC-ADM Inpatient Diabetes Coordinator Team Pager [phone] (8a-5p)

## 2019-03-13 NOTE — TOC Transition Note (Signed)
Transition of Care [facility]) - CM/SW Discharge Note   Patient Details  Name: Shawn Watkins MRN: [id number] Date of Birth: 1980-05-02  Transition of Care [facility]) CM/SW Contact:  Elliot Gault, LCSW Phone Number: 03/13/2019, 4:00 PM   Clinical Narrative:     Pt stable for dc per MD. [facility] team assisted in arranging [facility] for medication assistance, ETOH treatment resource list, indigent health clinic follow up options. RN updated on all and will provide the printed information to pt.  There are no other TOC needs for dc.  Final next level of care: Home/Self Care Barriers to Discharge: Barriers Resolved   Patient Goals and CMS Choice        Discharge Placement                       Discharge Plan and Services In-house Referral: Clinical Social Work Discharge Planning Services: [facility], [facility]                                 Social Determinants of Health (SDOH) Interventions     Readmission Risk Interventions No flowsheet data found.

## 2019-03-13 NOTE — Discharge Summary (Signed)
[facility]  Physician Discharge Summary   Patient ID: REAL CONA MRN: [id number] DOB/AGE: January 16, 1981 39 y.o.  Admit date: 02/26/2019 Discharge date: 03/13/2019  PCP: Patient, No Pcp Per  DISCHARGE DIAGNOSES:  Pneumonia due to COVID-19 Aspiration pneumonia, improved Acute respiratory failure with hypoxia, resolved Acute kidney injury, improved Hyperkalemia, resolved Oral ulcers, improving Acute metabolic encephalopathy, improved Diabetes mellitus type 1 uncontrolled with hyperglycemia DKA on presentation, resolved Demand ischemia Acute alcoholic hepatitis, improved Alcohol withdrawal syndrome, improved Thrombocytopenia, resolved Sacral  wound  RECOMMENDATIONS FOR OUTPATIENT FOLLOW UP: 1. Monitor CBGs at home. 2. Wound care to the sacral wound. 3. Outpatient follow-up with cardiology.  Will send message to [facility]    Home Health: None Equipment/Devices: None  CODE STATUS: Full code  DISCHARGE CONDITION: Stable  Diet recommendation: Modified carbohydrate  INITIAL HISTORY: 39 y/o male with a past medical history of type 1 diabetes, alcohol abuse, history of pancreatitis, depression, seizures, seen 1/3 with nausea, vomiting, sore throat and body aches. Diagnosed with COVID, then left for home AMA. Returned 1/11 to [facility], in DKA, intubated for delirium and transferred to [facility]. Course complicated by AKI and persistent hypotension.  Consultations:  Pulmonology, nephrology  Procedures:  Transthoracic echocardiogram 03/04/2019 1. Left ventricular ejection fraction, by visual estimation, is 60 to 65%. The left ventricle has normal function. There is no increased left ventricular wall thickness. 2. Global right ventricle has normal systolc function.The right ventricular size is normal. no increase in right ventricular wall thickness. 3. The mitral valve is normal in structure. Trivial mitral valve regurgitation. No evidence of mitral stenosis. 4. The tricuspid  valve was normal in structure. Tricuspid valve regurgitation is mild. 5. Tricuspid valve regurgitation is mild. 6. No evidence of aortic valve sclerosis or stenosis. 7. The inferior vena cava is normal in size with greater than 50% respiratory variability, suggesting right atrial pressure of 3 mmHg.   HOSPITAL COURSE:   Acute Hypoxic Resp. Failure/Pneumonia due to COVID-19/aspiration pneumonia Patient's chest x-ray showed bilateral airspace disease.  This was thought to be secondary to COVID-19 along with aspiration.  Patient had to be intubated.  Due to significantly elevated LFTs he was not given remdesivir.  He was treated with antibacterials including vancomycin and cefepime.  He improved.  He is now saturating normal on room air.  Acute kidney injury/hyperkalemia Thought to be secondary to rhabdomyolysis.  He was initially on CRRT.  Off of CRRT since 01/17.  He started making urine.  Creatinine has been improving daily.  He was taken off of IV fluids.  Also noted to have hyperkalemia for which he was given Lokelma and Kayexalate.  Potassium normal this morning.  Oral ulcers Likely due to acute viral infection.  There also sores on his lips.    He was given Valtrex.  He has found it difficult to swallow due to pain.  However this appears to have improved.  Continue soft diet.  Continue with Magic mouthwash.  Patient told that his source will get better over time.  Generalized body ache Patient was requesting escalating doses of narcotics.  He states that his entire body is hurting.  He was educated about narcotics and potential side effects and why it is not a good idea for him to be on these medications.   Acute metabolic encephalopathy This was a multifactorial in the setting of DKA, renal failure, COVID-19, alcohol withdrawal.  Seems to have improved.  He was treated with Librium taper.   Diabetes mellitus type 1, uncontrolled  with hyperglycemia/DKA on presentation DKA  resolved.  HbA1c 12.2.    Resume home regimen.  Likely was not compliant.  Demand ischemia Patient did have elevated troponin levels.  This was thought to be secondary to demand ischemia from acute illness.  EKG did not show any ischemic changes.  Nonspecific changes were noted.  Echocardiogram was done which does not show any wall motion abnormality.  Normal LV function.  Aspirin was added which will be continued.  He will benefit from seeing cardiology in the outpatient setting.  Acute alcoholic hepatitis LFTs have improved.    Alcohol withdrawal syndrome Alprazolam as needed for anxiety for a few more days  Thrombocytopenia and normocytic anemia Low platelet counts most likely alcohol-related along with acute infection.  Platelet counts are now normal.  Hemoglobin is low but stable.  No evidence for overt bleeding.    Pressure injury sacral area, present admission Pressure Injury 02/26/19 Sacrum Right;Left;Medial Unstageable - Full thickness tissue loss in which the base of the injury is covered by slough (yellow, tan, gray, green or brown) and/or eschar (tan, brown or black) in the wound bed. 03/12/19 previous dee (Active)  02/26/19 1200  Location: Sacrum  Location Orientation: Right;Left;Medial  Staging: Unstageable - Full thickness tissue loss in which the base of the injury is covered by slough (yellow, tan, gray, green or brown) and/or eschar (tan, brown or black) in the wound bed.  Wound Description (Comments): 03/12/19 previous deep tissue injury has evolved to unstageable.    Present on Admission: Yes     Pressure Injury 03/02/19 Lip Medial;Upper Deep Tissue Pressure Injury - Purple or maroon localized area of discolored intact skin or blood-filled blister due to damage of underlying soft tissue from pressure and/or shear. (Active)  03/02/19 1930  Location: Lip  Location Orientation: Medial;Upper  Staging: Deep Tissue Pressure Injury - Purple or maroon localized area of  discolored intact skin or blood-filled blister due to damage of underlying soft tissue from pressure and/or shear.  Wound Description (Comments):   Present on Admission: No     Pressure Injury 03/02/19 Ear Right Stage 2 -  Partial thickness loss of dermis presenting as a shallow open injury with a red, pink wound bed without slough. (Active)  03/02/19 1930  Location: Ear  Location Orientation: Right  Staging: Stage 2 -  Partial thickness loss of dermis presenting as a shallow open injury with a red, pink wound bed without slough.  Wound Description (Comments):   Present on Admission: No    Overall stable.  Okay for discharge home today.  PERTINENT LABS:  The results of significant diagnostics from this hospitalization (including imaging, microbiology, ancillary and laboratory) are listed below for reference.      Labs:     Basic Metabolic Panel: Recent Labs  Lab 03/07/19 0454 03/08/19 0417 03/09/19 0108 03/09/19 0108 03/10/19 0542 03/10/19 0542 03/11/19 [id number] 03/11/19 0628 03/11/19 1450 03/12/19 0557 03/12/19 1420 03/12/19 2015 03/13/19 0620  NA 144   < > 145  --  140  --  137  --   --  136  --   --  137  K 3.9   < > 3.9   < > 4.1   < > 5.4*   < > 5.0 5.5* 5.5* 5.3* 4.6  CL 110   < > 112*  --  109  --  108  --   --  105  --   --  107  CO2 23   < >  23  --  23  --  22  --   --  24  --   --  22  GLUCOSE 121*   < > 141*  --  133*  --  236*  --   --  239*  --   --  179*  BUN 60*   < > 55*  --  49*  --  46*  --   --  42*  --   --  35*  CREATININE 4.63*   < > 3.43*  --  2.71*  --  2.03*  --   --  1.71*  --   --  1.32*  CALCIUM 7.3*   < > 7.9*  --  7.8*  --  7.7*  --   --  8.0*  --   --  7.6*  PHOS 4.0  --   --   --   --   --   --   --   --   --   --   --   --    < > = values in this interval not displayed.   Liver Function Tests: Recent Labs  Lab 03/08/19 0417 03/09/19 0108 03/10/19 0542 03/11/19 0628 03/12/19 0557  AST 31 31 26 23 23   ALT 57* 58* 44 36 34    ALKPHOS 247* 247* 216* 188* 186*  BILITOT 0.7 0.8 0.7 0.4 0.5  PROT 5.1* 6.1* 5.8* 5.6* 6.1*  ALBUMIN 2.0* 2.4* 2.3* 2.1* 2.4*   CBC: Recent Labs  Lab 03/08/19 0417 03/09/19 0108 03/10/19 0542 03/11/19 0628 03/12/19 0557  WBC 8.9 11.9* 9.1 7.9 9.8  HGB 7.5* 8.4* 7.6* 7.2* 8.5*  HCT 22.3* 24.8* 23.8* 22.2* 25.2*  MCV 86.1 87.3 91.2 88.8 88.4  PLT 120* 158 170 206 271   CBG: Recent Labs  Lab 03/12/19 1550 03/12/19 2038 03/12/19 2202 03/13/19 0745 03/13/19 1207  GLUCAP 186* 248* 284* 165* 170*     IMAGING STUDIES CT ABDOMEN PELVIS WO CONTRAST  Result Date: 02/27/2019 CLINICAL DATA:  Unspecified abdominal pain EXAM: CT ABDOMEN AND PELVIS WITHOUT CONTRAST TECHNIQUE: Multidetector CT imaging of the abdomen and pelvis was performed following the standard protocol without IV contrast. COMPARISON:  CT 07/17/2016 FINDINGS: Lower chest: Small bilateral pleural effusions with dense adjacent atelectatic change. Underlying consolidation is not excluded particularly given some adjacent tree-in-bud reticulonodular opacity. Normal heart size. No pericardial effusion. Hypoattenuation of the cardiac blood pool relative to the myocardium may reflect relative anemia. Hepatobiliary: Diffuse hepatic hypoattenuation most often compatible with hepatic steatosis. Some sparing is noted along the gallbladder fossa. The gallbladder itself appears slightly distended but without visible radiopaque gallstone. A distal common bile duct stent is present. No significant intra or proximal extrahepatic ductal dilatation is seen. Pancreas: Extensive distal pancreatic calcifications are similar to prior with new parenchymal calcifications involving the pancreatic head neck junction. No pancreatic ductal dilatation is seen. Spleen: Normal in size without focal abnormality. Adrenals/Urinary Tract: Lobular configuration of the adrenal glands is similar to prior. No concerning visible or contour deforming renal lesions. No  urolithiasis or hydronephrosis. Urinary bladder is circumferentially thickened though partially decompressed by inflated Foley catheter balloon with intraluminal gas likely related to this instrumentation. Stomach/Bowel: A transesophageal tube is in place with the side port positioned at the GE junction. Tip positioned within the gastric lumen. Assessment of the bowel is limited given diffuse ascites and lack of intravenous contrast material. There appears to be some diffuse small bowel thickening but without frank  dilatation. There is focal edematous thickening of the transverse and proximal descending colon with a normal appearance of the remaining colonic segments. The appendix is not well visualized. Vascular/Lymphatic: Right lower extremity central venous catheter is noted. Evaluation of adenopathy is limited in the absence of contrast media and given extensive edematous changes in the mesentery. Reproductive: The prostate and seminal vesicles are unremarkable. Other: Moderate volume ascites and edematous changes of the mesentery. Extensive circumferential body wall edema is noted as well. Musculoskeletal: No acute osseous abnormality or suspicious osseous lesion. IMPRESSION: 1. Small bilateral pleural effusions with dense adjacent atelectatic change. Underlying consolidation is suspected particularly given some adjacent tree-in-bud reticulonodular opacity in known COVID-19 positivity. 2. Diffuse body wall edema, ascites, and mesenteric edema compatible with anasarca/volume third-spacing. 3. Diffuse thickening of the small bowel and marked thickening of the transverse colon, nonspecific though in the setting of hypotension and reported MODS raises concern for shock bowel. Additional considerations could be secondary to volume third-spacing or a diffuse enterocolitis. 4. Interval placement of a transesophageal tube with the side port positioned at the GE junction. Consider advancement 3-5 cm to position within  the gastric lumen for optimal function. 5. Interval placement of a distal common bile duct stent. Pancreatic parenchymal calcifications may reflect sequela of prior pancreatitis/chronic pancreatitis. 6. Circumferentially thickened urinary bladder wall, partially decompressed by inflated Foley catheter balloon with intraluminal gas likely related to this instrumentation. While the wall thickening could be related to underdistention, cannot exclude a superimposed cystitis. 7. Features of anemia with hypoattenuation of the cardiac blood pool relative to the myocardium. These results will be called to the ordering clinician or representative by the Radiologist Assistant, and communication documented in the PACS or zVision Dashboard. Electronically Signed   By: Kreg Shropshire M.D.   On: 02/27/2019 23:35   DG CHEST PORT 1 VIEW  Result Date: 03/05/2019 CLINICAL DATA:  Pneumonia, COVID-19, type II diabetes mellitus at, smoker, ethanol abuse EXAM: PORTABLE CHEST 1 VIEW COMPARISON:  Portable exam 1401 hours compared to 03/02/2019 FINDINGS: Interval removal of endotracheal and nasogastric tubes. RIGHT jugular central venous catheter with tip projecting over SVC. Normal heart size mediastinal contours. Patchy BILATERAL pulmonary infiltrates consistent with pneumonia, slightly increased in LEFT upper lobe since previous study. No pleural effusion or pneumothorax. IMPRESSION: BILATERAL pulmonary infiltrates consistent with multifocal pneumonia, slightly increased in LEFT upper lobe since 03/02/2019. Electronically Signed   By: Ulyses Southward M.D.   On: 03/05/2019 14:22   DG CHEST PORT 1 VIEW  Result Date: 03/02/2019 CLINICAL DATA:  Central line placement EXAM: PORTABLE CHEST 1 VIEW COMPARISON:  03/01/2019 FINDINGS: Interval placement of a right neck vascular catheter, tip projecting near the confluence of the superior vena cava. Endotracheal tube remains position just below the thoracic inlet. Esophagogastric tube with tip and  side port below the diaphragm. Unchanged moderate right pleural effusion with associated atelectasis or consolidation and probable small layering left pleural effusion. No new airspace opacity. IMPRESSION: 1. Interval placement of right neck vascular catheter, tip projecting near the confluence of the superior vena cava. 2.  Otherwise unchanged AP portable examination. Electronically Signed   By: Lauralyn Primes M.D.   On: 03/02/2019 12:46   DG CHEST PORT 1 VIEW  Result Date: 03/01/2019 CLINICAL DATA:  COVID-19 positive. Acute respiratory failure with hypoxemia EXAM: PORTABLE CHEST 1 VIEW COMPARISON:  02/27/2019 chest radiograph. FINDINGS: Endotracheal tube tip is 4.5 cm above the carina. Enteric tube enters stomach with the tip not seen on this image.  Stable cardiomediastinal silhouette with normal heart size. No pneumothorax. Small right pleural effusion appears slightly increased. No left pleural effusion. No pulmonary edema. Hazy bibasilar lung opacities appear slightly worsened. IMPRESSION: 1. Well-positioned support structures.  No pneumothorax. 2. Small right pleural effusion appears slightly increased. 3. Hazy bibasilar lung opacities appear slightly worsened, either atelectasis or pneumonia. Electronically Signed   By: Delbert PhenixJason A Poff M.D.   On: 03/01/2019 13:27   DG CHEST PORT 1 VIEW  Result Date: 02/27/2019 CLINICAL DATA:  Acute respiratory failure. EXAM: PORTABLE CHEST 1 VIEW COMPARISON:  02/26/2019. FINDINGS: Endotracheal tube stable position. NG tube tip below left hemidiaphragm. Heart size stable. Low lung volumes. Bibasilar atelectasis/infiltrates. No pleural effusion or pneumothorax. IMPRESSION: 1. Endotracheal tube in stable position. NG tube tip below left hemidiaphragm. 2.  Low lung volumes with bibasilar atelectasis/infiltrates. Electronically Signed   By: Maisie Fushomas  Register   On: 02/27/2019 06:56   DG Chest Port 1 View  Result Date: 02/26/2019 CLINICAL DATA:  Encephalopathy EXAM: PORTABLE  CHEST 1 VIEW COMPARISON:  02/18/2019 FINDINGS: Endotracheal tube tip is at the level of the clavicular heads. The lungs are clear. The cardiomediastinal contours are normal. IMPRESSION: Endotracheal tube tip at the level of the clavicular heads. Electronically Signed   By: Deatra RobinsonKevin  Herman M.D.   On: 02/26/2019 01:30   DG Chest Port 1 View  Result Date: 02/18/2019 CLINICAL DATA:  Cough. EXAM: PORTABLE CHEST 1 VIEW COMPARISON:  February 06, 2017. FINDINGS: The heart size and mediastinal contours are within normal limits. Both lungs are clear. The visualized skeletal structures are unremarkable. IMPRESSION: No active disease. Electronically Signed   By: Lupita RaiderJames  Green Jr M.D.   On: 02/18/2019 10:02   ECHOCARDIOGRAM COMPLETE  Result Date: 02/26/2019   ECHOCARDIOGRAM REPORT   Patient Name:   Shawn Watkins Date of Exam: 02/26/2019 Medical Rec #:  [id number]       Height:       73.0 in Accession #:    [id number]      Weight:       179.9 lb Date of Birth:  03-Feb-1981       BSA:          2.06 m Patient Age:    38 years        BP:           90/38 mmHg Patient Gender: M               HR:           152 bpm. Exam Location:  Inpatient Procedure: 2D Echo Indications:    Acute Respiratory Insufficiency 518.82 / R06.89  History:        Patient has no prior history of Echocardiogram examinations.                 Signs/Symptoms:Hypotension and Fever. ETOH abuse. Diagnosed with                 COVID on 1/3 left AMA, returend on 1/11 with altered mental                 status.  Sonographer:    Leta Junglingiffany Cooper RDCS Referring Phys: [id number] Luetta NuttingBRANDI L OLLIS IMPRESSIONS  1. Left ventricular ejection fraction, by visual estimation, is 70 to 75%. The left ventricle has hyperdynamic function. There is no left ventricular hypertrophy.  2. The left ventricle has no regional wall motion abnormalities.  3. Global right ventricle has normal systolic function.The right ventricular size is  normal. No increase in right ventricular wall thickness.  4.  Left atrial size was normal.  5. Right atrial size was normal.  6. The mitral valve is normal in structure. No evidence of mitral valve regurgitation. No evidence of mitral stenosis.  7. The tricuspid valve is normal in structure.  8. The aortic valve is normal in structure. Aortic valve regurgitation is not visualized. No evidence of aortic valve sclerosis or stenosis.  9. The pulmonic valve was normal in structure. Pulmonic valve regurgitation is not visualized. 10. Normal pulmonary artery systolic pressure. 11. The inferior vena cava is normal in size with greater than 50% respiratory variability, suggesting right atrial pressure of 3 mmHg. FINDINGS  Left Ventricle: Left ventricular ejection fraction, by visual estimation, is 70 to 75%. The left ventricle has hyperdynamic function. The left ventricle has no regional wall motion abnormalities. There is no left ventricular hypertrophy. Normal left atrial pressure. Right Ventricle: The right ventricular size is normal. No increase in right ventricular wall thickness. Global RV systolic function is has normal systolic function. The tricuspid regurgitant velocity is 1.59 m/s, and with an assumed right atrial pressure  of 8 mmHg, the estimated right ventricular systolic pressure is normal at 18.1 mmHg. Left Atrium: Left atrial size was normal in size. Right Atrium: Right atrial size was normal in size Pericardium: There is no evidence of pericardial effusion. Mitral Valve: The mitral valve is normal in structure. No evidence of mitral valve regurgitation. No evidence of mitral valve stenosis by observation. Tricuspid Valve: The tricuspid valve is normal in structure. Tricuspid valve regurgitation is not demonstrated. Aortic Valve: The aortic valve is normal in structure. Aortic valve regurgitation is not visualized. The aortic valve is structurally normal, with no evidence of sclerosis or stenosis. Pulmonic Valve: The pulmonic valve was normal in structure. Pulmonic  valve regurgitation is not visualized. Pulmonic regurgitation is not visualized. Aorta: The aortic root, ascending aorta and aortic arch are all structurally normal, with no evidence of dilitation or obstruction. Venous: The inferior vena cava is normal in size with greater than 50% respiratory variability, suggesting right atrial pressure of 3 mmHg. IAS/Shunts: No atrial level shunt detected by color flow Doppler. There is no evidence of a patent foramen ovale. No ventricular septal defect is seen or detected. There is no evidence of an atrial septal defect.  LEFT VENTRICLE PLAX 2D LVIDd:         3.70 cm LVIDs:         1.98 cm LV PW:         1.00 cm LV IVS:        0.90 cm LVOT diam:     2.00 cm LV SV:         46 ml LV SV Index:   22.28 LVOT Area:     3.14 cm  RIGHT VENTRICLE TAPSE (M-mode): 1.6 cm LEFT ATRIUM             Index LA diam:        2.40 cm 1.17 cm/m LA Vol (A2C):   25.0 ml 12.15 ml/m LA Vol (A4C):   24.1 ml 11.72 ml/m LA Biplane Vol: 25.1 ml 12.20 ml/m  AORTIC VALVE LVOT Vmax:   127.00 cm/s LVOT Vmean:  89.000 cm/s LVOT VTI:    0.141 m  AORTA Ao Root diam: 2.70 cm TRICUSPID VALVE TR Peak grad:   10.1 mmHg TR Vmax:        159.00 cm/s  SHUNTS Systemic VTI:  0.14  m Systemic Diam: 2.00 cm  Donato Schultz MD Electronically signed by Donato Schultz MD Signature Date/Time: 02/26/2019/4:21:26 PM    Final    ECHOCARDIOGRAM LIMITED  Result Date: 03/04/2019   ECHOCARDIOGRAM LIMITED REPORT   Patient Name:   Shawn Watkins Date of Exam: 03/04/2019 Medical Rec #:  [id number]       Height:       73.0 in Accession #:    [id number]      Weight:       216.7 lb Date of Birth:  [birth year]       BSA:          2.23 m Patient Age:    38 years        BP:           113/82 mmHg Patient Gender: M               HR:           108 bpm. Exam Location:  Inpatient  Procedure: Limited Echo, Limited Color Doppler and Cardiac Doppler Indications:    elevated troponin  History:        Patient has prior history of Echocardiogram  examinations, most                 recent 02/26/2019.  Sonographer:    Delcie Roch Referring Phys: [id number] MURALI RAMASWAMY IMPRESSIONS  1. Left ventricular ejection fraction, by visual estimation, is 60 to 65%. The left ventricle has normal function. There is no increased left ventricular wall thickness.  2. Global right ventricle has normal systolc function.The right ventricular size is normal. no increase in right ventricular wall thickness.  3. The mitral valve is normal in structure. Trivial mitral valve regurgitation. No evidence of mitral stenosis.  4. The tricuspid valve was normal in structure. Tricuspid valve regurgitation is mild.  5. Tricuspid valve regurgitation is mild.  6. No evidence of aortic valve sclerosis or stenosis.  7. The inferior vena cava is normal in size with greater than 50% respiratory variability, suggesting right atrial pressure of 3 mmHg. FINDINGS  Left Ventricle: Left ventricular ejection fraction, by visual estimation, is 60 to 65%. The left ventricle has normal function. There is no increased left ventricular wall thickness. Left ventricular diastolic parameters were normal. Normal left atrial pressure. Right Ventricle: The right ventricular size is normal. No increase in right ventricular wall thickness. Global RV systolic function is has normal systolic function. Left Atrium: Left atrial size was normal in size. Right Atrium: Right atrial size was normal in size. Right atrial pressure is estimated at 3 mmHg. Pericardium: There is no evidence of pericardial effusion is seen. There is no evidence of pericardial effusion. Mitral Valve: The mitral valve is normal in structure. There is mild thickening of the mitral valve leaflet(s). No evidence of mitral valve stenosis by observation. Trivial mitral valve regurgitation. Tricuspid Valve: The tricuspid valve is normal in structure. Tricuspid valve regurgitation is mild. Aortic Valve: The aortic valve is normal in structure. Aortic  valve regurgitation is not visualized. The aortic valve is structurally normal, with no evidence of sclerosis or stenosis. Pulmonic Valve: The pulmonic valve was not well visualized. Pulmonic valve regurgitation is trivial by color flow Doppler. Pulmonic regurgitation is trivial by color flow Doppler. Aorta: The aortic root, ascending aorta and aortic arch are all structurally normal, with no evidence of dilitation or obstruction. Venous: The inferior vena cava is normal in size with greater than 50% respiratory variability, suggesting  right atrial pressure of 3 mmHg. Shunts: There is no evidence of a patent foramen ovale. No ventricular septal defect is seen or detected. There is no evidence of an atrial septal defect. No atrial level shunt detected by color flow Doppler.  LEFT VENTRICLE          Normals PLAX 2D LVIDd:         4.88 cm  3.6 cm LVIDs:         3.63 cm  1.7 cm LV PW:         1.00 cm  1.4 cm LV IVS:        1.00 cm  1.3 cm LVOT diam:     2.30 cm  2.0 cm LV SV:         56 ml    79 ml LV SV Index:   24.79    45 ml/m2 LVOT Area:     4.15 cm 3.14 cm2  LEFT ATRIUM         Index LA diam:    3.70 cm 1.66 cm/m  AORTIC VALVE             Normals LVOT Vmax:   96.10 cm/s LVOT Vmean:  64.700 cm/s 75 cm/s LVOT VTI:    0.182 m     25.3 cm  AORTA                 Normals Ao Root diam: 3.30 cm 31 mm  SHUNTS Systemic VTI:  0.18 m Systemic Diam: 2.30 cm  Charlton Haws MD Electronically signed by Charlton Haws MD Signature Date/Time: 03/04/2019/3:15:53 PMThe mitral valve is normal in structure.    Final     DISCHARGE EXAMINATION: Vitals:   03/13/19 0424 03/13/19 0747 03/13/19 1220 03/13/19 1341  BP: 111/70 116/72 115/80   Pulse: 69 (!) 57 69   Resp: 18 20 19    Temp: 97.8 F (36.6 C) 98.7 F (37.1 C)    TempSrc: Oral Oral    SpO2: 98% 99% 96% 97%  Weight:      Height:       General appearance: Awake alert.  In no distress Resp: Clear to auscultation bilaterally.  Normal effort Cardio: S1-S2 is normal  regular.  No S3-S4.  No rubs murmurs or bruit GI: Abdomen is soft.  Nontender nondistended.  Bowel sounds are present normal.  No masses organomegaly    DISPOSITION: Home with father  Discharge Instructions    Call MD for:  difficulty breathing, headache or visual disturbances   Complete by: As directed    Call MD for:  extreme fatigue   Complete by: As directed    Call MD for:  persistant dizziness or light-headedness   Complete by: As directed    Call MD for:  persistant nausea and vomiting   Complete by: As directed    Call MD for:  severe uncontrolled pain   Complete by: As directed    Call MD for:  temperature >100.4   Complete by: As directed    Diet Carb Modified   Complete by: As directed    Discharge instructions   Complete by: As directed    Please take your medications as prescribed.  Check your glucose levels 3 times a day.  Perform wound care as instructed.  Follow-up with primary care providers.  You were cared for by a hospitalist during your hospital stay. If you have any questions about your discharge medications or the care you received while you were  in the hospital after you are discharged, you can call the unit and asked to speak with the hospitalist on call if the hospitalist that took care of you is not available. Once you are discharged, your primary care physician will handle any further medical issues. Please note that NO REFILLS for any discharge medications will be authorized once you are discharged, as it is imperative that you return to your primary care physician (or establish a relationship with a primary care physician if you do not have one) for your aftercare needs so that they can reassess your need for medications and monitor your lab values. If you do not have a primary care physician, you can call [phone] for a physician referral.   Increase activity slowly   Complete by: As directed          Allergies as of 03/13/2019   No Known Allergies       Medication List    STOP taking these medications   Admelog 100 UNIT/ML injection Generic drug: insulin lispro   pantoprazole 20 MG tablet Commonly known as: PROTONIX     TAKE these medications   ALPRAZolam 0.5 MG tablet Commonly known as: Xanax Take 1 tablet (0.5 mg total) by mouth 3 (three) times daily as needed for anxiety.   collagenase ointment Commonly known as: SANTYL Apply topically daily. Apply to wound on the sacrum Start taking on: March 14, 2019   INS SYRINGE/NEEDLE .5CC/28G 28G X 1/2" 0.5 ML Misc Use as directed   insulin glargine 100 UNIT/ML injection Commonly known as: LANTUS Inject 0.08 mLs (8 Units total) into the skin at bedtime. What changed: how much to take   magic mouthwash w/lidocaine Soln Take 10 mLs by mouth 3 (three) times daily.   ondansetron 4 MG disintegrating tablet Commonly known as: Zofran ODT Take 1 tablet (4 mg total) by mouth every 8 (eight) hours as needed for nausea or vomiting.   oxyCODONE 5 MG immediate release tablet Commonly known as: Oxy IR/ROXICODONE Take 1 tablet (5 mg total) by mouth every 6 (six) hours as needed for up to 5 days for moderate pain.   polymixin-bacitracin 500-10000 UNIT/GM Oint ointment Apply 1 application topically daily. Apply to sacral wound        Follow-up Information    [facility] Follow up on 03/14/2019.   Why: new pt's come between 1p-4p Tuesdays and Thursday; only take 40 applicants per day so come early: must bring Photo ID, proof of address, proof of any household income, proof of any household expenses; other documents may be requested Contact information: [address] [place], [place] [address] [phone]            TOTAL DISCHARGE TIME: 35 minutes  Wilmar Prabhakar Rito Ehrlich  [facility] Pager on [email or web address]  03/13/2019, 4:37 PM

## 2019-03-13 NOTE — Discharge Instructions (Signed)
COVID-19 COVID-19 is a respiratory infection that is caused by a virus called severe acute respiratory syndrome coronavirus 2 (SARS-CoV-2). The disease is also known as coronavirus disease or novel coronavirus. In some people, the virus may not cause any symptoms. In others, it may cause a serious infection. The infection can get worse quickly and can lead to complications, such as:  Pneumonia, or infection of the lungs.  Acute respiratory distress syndrome or ARDS. This is a condition in which fluid build-up in the lungs prevents the lungs from filling with air and passing oxygen into the blood.  Acute respiratory failure. This is a condition in which there is not enough oxygen passing from the lungs to the body or when carbon dioxide is not passing from the lungs out of the body.  Sepsis or septic shock. This is a serious bodily reaction to an infection.  Blood clotting problems.  Secondary infections due to bacteria or fungus.  Organ failure. This is when your body's organs stop working. The virus that causes COVID-19 is contagious. This means that it can spread from person to person through droplets from coughs and sneezes (respiratory secretions). What are the causes? This illness is caused by a virus. You may catch the virus by:  Breathing in droplets from an infected person. Droplets can be spread by a person breathing, speaking, singing, coughing, or sneezing.  Touching something, like a table or a doorknob, that was exposed to the virus (contaminated) and then touching your mouth, nose, or eyes. What increases the risk? Risk for infection You are more likely to be infected with this virus if you:  Are within 6 feet (2 meters) of a person with COVID-19.  Provide care for or live with a person who is infected with COVID-19.  Spend time in crowded indoor spaces or live in shared housing. Risk for serious illness You are more likely to become seriously ill from the virus if  you:  Are 50 years of age or older. The higher your age, the more you are at risk for serious illness.  Live in a nursing home or long-term care facility.  Have cancer.  Have a long-term (chronic) disease such as: ? Chronic lung disease, including chronic obstructive pulmonary disease or asthma. ? A long-term disease that lowers your body's ability to fight infection (immunocompromised). ? Heart disease, including heart failure, a condition in which the arteries that lead to the heart become narrow or blocked (coronary artery disease), a disease which makes the heart muscle thick, weak, or stiff (cardiomyopathy). ? Diabetes. ? Chronic kidney disease. ? Sickle cell disease, a condition in which red blood cells have an abnormal "sickle" shape. ? Liver disease.  Are obese. What are the signs or symptoms? Symptoms of this condition can range from mild to severe. Symptoms may appear any time from 2 to 14 days after being exposed to the virus. They include:  A fever or chills.  A cough.  Difficulty breathing.  Headaches, body aches, or muscle aches.  Runny or stuffy (congested) nose.  A sore throat.  New loss of taste or smell. Some people may also have stomach problems, such as nausea, vomiting, or diarrhea. Other people may not have any symptoms of COVID-19. How is this diagnosed? This condition may be diagnosed based on:  Your signs and symptoms, especially if: ? You live in an area with a COVID-19 outbreak. ? You recently traveled to or from an area where the virus is common. ? You   provide care for or live with a person who was diagnosed with COVID-19. ? You were exposed to a person who was diagnosed with COVID-19.  A physical exam.  Lab tests, which may include: ? Taking a sample of fluid from the back of your nose and throat (nasopharyngeal fluid), your nose, or your throat using a swab. ? A sample of mucus from your lungs (sputum). ? Blood tests.  Imaging tests,  which may include, X-rays, CT scan, or ultrasound. How is this treated? At present, there is no medicine to treat COVID-19. Medicines that treat other diseases are being used on a trial basis to see if they are effective against COVID-19. Your health care provider will talk with you about ways to treat your symptoms. For most people, the infection is mild and can be managed at home with rest, fluids, and over-the-counter medicines. Treatment for a serious infection usually takes places in a hospital intensive care unit (ICU). It may include one or more of the following treatments. These treatments are given until your symptoms improve.  Receiving fluids and medicines through an IV.  Supplemental oxygen. Extra oxygen is given through a tube in the nose, a face mask, or a hood.  Positioning you to lie on your stomach (prone position). This makes it easier for oxygen to get into the lungs.  Continuous positive airway pressure (CPAP) or bi-level positive airway pressure (BPAP) machine. This treatment uses mild air pressure to keep the airways open. A tube that is connected to a motor delivers oxygen to the body.  Ventilator. This treatment moves air into and out of the lungs by using a tube that is placed in your windpipe.  Tracheostomy. This is a procedure to create a hole in the neck so that a breathing tube can be inserted.  Extracorporeal membrane oxygenation (ECMO). This procedure gives the lungs a chance to recover by taking over the functions of the heart and lungs. It supplies oxygen to the body and removes carbon dioxide. Follow these instructions at home: Lifestyle  If you are sick, stay home except to get medical care. Your health care provider will tell you how long to stay home. Call your health care provider before you go for medical care.  Rest at home as told by your health care provider.  Do not use any products that contain nicotine or tobacco, such as cigarettes,  e-cigarettes, and chewing tobacco. If you need help quitting, ask your health care provider.  Return to your normal activities as told by your health care provider. Ask your health care provider what activities are safe for you. General instructions  Take over-the-counter and prescription medicines only as told by your health care provider.  Drink enough fluid to keep your urine pale yellow.  Keep all follow-up visits as told by your health care provider. This is important. How is this prevented?  There is no vaccine to help prevent COVID-19 infection. However, there are steps you can take to protect yourself and others from this virus. To protect yourself:   Do not travel to areas where COVID-19 is a risk. The areas where COVID-19 is reported change often. To identify high-risk areas and travel restrictions, check the CDC travel website: wwwnc.cdc.gov/travel/notices  If you live in, or must travel to, an area where COVID-19 is a risk, take precautions to avoid infection. ? Stay away from people who are sick. ? Wash your hands often with soap and water for 20 seconds. If soap and water   are not available, use an alcohol-based hand sanitizer. ? Avoid touching your mouth, face, eyes, or nose. ? Avoid going out in public, follow guidance from your state and local health authorities. ? If you must go out in public, wear a cloth face covering or face mask. Make sure your mask covers your nose and mouth. ? Avoid crowded indoor spaces. Stay at least 6 feet (2 meters) away from others. ? Disinfect objects and surfaces that are frequently touched every day. This may include:  Counters and tables.  Doorknobs and light switches.  Sinks and faucets.  Electronics, such as phones, remote controls, keyboards, computers, and tablets. To protect others: If you have symptoms of COVID-19, take steps to prevent the virus from spreading to others.  If you think you have a COVID-19 infection, contact  your health care provider right away. Tell your health care team that you think you may have a COVID-19 infection.  Stay home. Leave your house only to seek medical care. Do not use public transport.  Do not travel while you are sick.  Wash your hands often with soap and water for 20 seconds. If soap and water are not available, use alcohol-based hand sanitizer.  Stay away from other members of your household. Let healthy household members care for children and pets, if possible. If you have to care for children or pets, wash your hands often and wear a mask. If possible, stay in your own room, separate from others. Use a different bathroom.  Make sure that all people in your household wash their hands well and often.  Cough or sneeze into a tissue or your sleeve or elbow. Do not cough or sneeze into your hand or into the air.  Wear a cloth face covering or face mask. Make sure your mask covers your nose and mouth. Where to find more information  Centers for Disease Control and Prevention: www.cdc.gov/coronavirus/2019-ncov/index.html  World Health Organization: www.who.int/health-topics/coronavirus Contact a health care provider if:  You live in or have traveled to an area where COVID-19 is a risk and you have symptoms of the infection.  You have had contact with someone who has COVID-19 and you have symptoms of the infection. Get help right away if:  You have trouble breathing.  You have pain or pressure in your chest.  You have confusion.  You have bluish lips and fingernails.  You have difficulty waking from sleep.  You have symptoms that get worse. These symptoms may represent a serious problem that is an emergency. Do not wait to see if the symptoms will go away. Get medical help right away. Call your local emergency services (911 in the [place] Do not drive yourself to the hospital. Let the emergency medical personnel know if you think you have  COVID-19. Summary  COVID-19 is a respiratory infection that is caused by a virus. It is also known as coronavirus disease or novel coronavirus. It can cause serious infections, such as pneumonia, acute respiratory distress syndrome, acute respiratory failure, or sepsis.  The virus that causes COVID-19 is contagious. This means that it can spread from person to person through droplets from breathing, speaking, singing, coughing, or sneezing.  You are more likely to develop a serious illness if you are 50 years of age or older, have a weak immune system, live in a nursing home, or have chronic disease.  There is no medicine to treat COVID-19. Your health care provider will talk with you about ways to treat your symptoms.    Take steps to protect yourself and others from infection. Wash your hands often and disinfect objects and surfaces that are frequently touched every day. Stay away from people who are sick and wear a mask if you are sick. This information is not intended to replace advice given to you by your health care provider. Make sure you discuss any questions you have with your health care provider. Document Revised: 12/01/2018 Document Reviewed: 03/09/2018 Elsevier Patient Education  2020 [employer].   Your hemaglobin A1c level was 12.2%. Goal is to be at 7% or less (150 when checking your glucose). Please take your insulin as prescribed and check your glucose.

## 2019-03-13 NOTE — Progress Notes (Signed)
Patient alert and oriented, vital signs are stable, RA, complains of mouth pain, ordered oral relief medication administered. He also complains of pain 10/10 "all over," ordered pain medication(s) administered. Patient resting in bed. Informed and educated on plan of care for the evening including dressing change for documented ulcer. Patient states understanding.

## 2019-06-12 MED ORDER — SODIUM CHLORIDE 0.9 % IV SOLN
150.00 | INTRAVENOUS | Status: DC
Start: ? — End: 2019-06-12

## 2020-04-28 IMAGING — DX DG CHEST 1V PORT
1 series · 2 of 2 positions shown · non-contrast
Comparison: 02/18/2019

CLINICAL DATA: Encephalopathy

EXAM:
PORTABLE CHEST 1 VIEW

[Series 1: chest ap · 0.14mm/px · 2 of 2 slices shown]
[im 1/2]
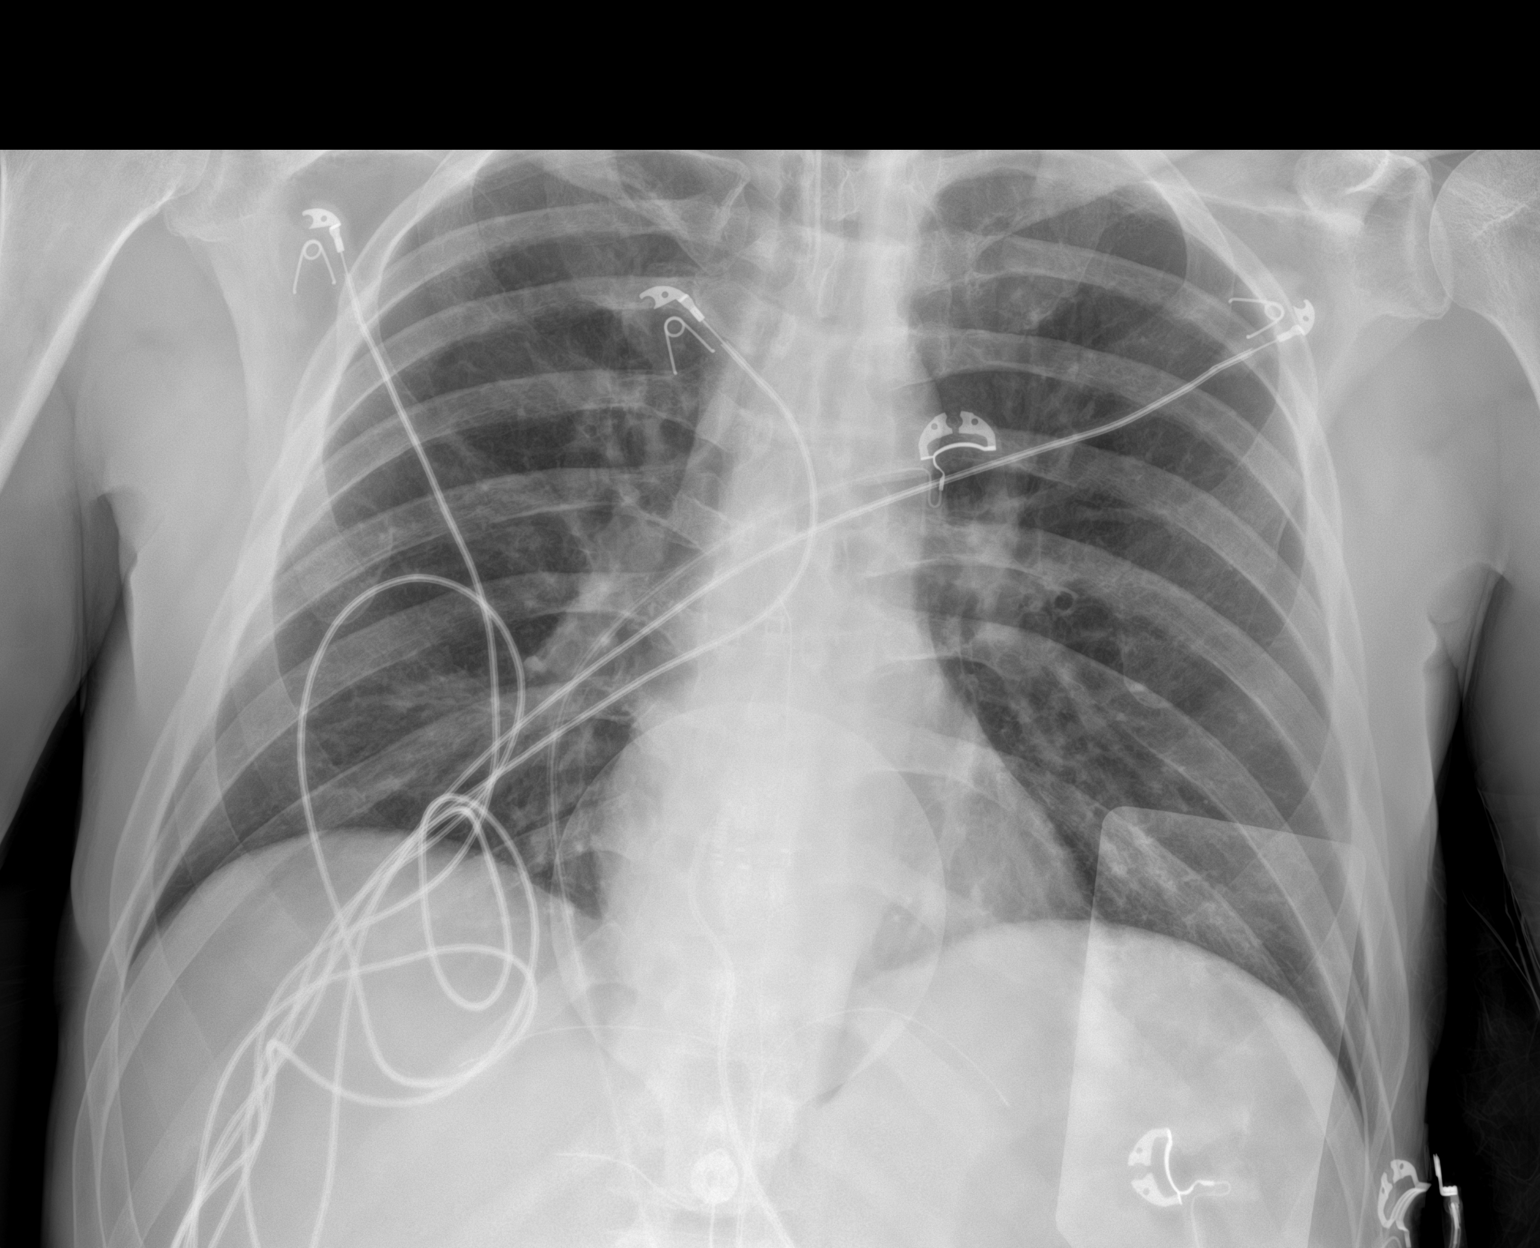
[im 2/2]
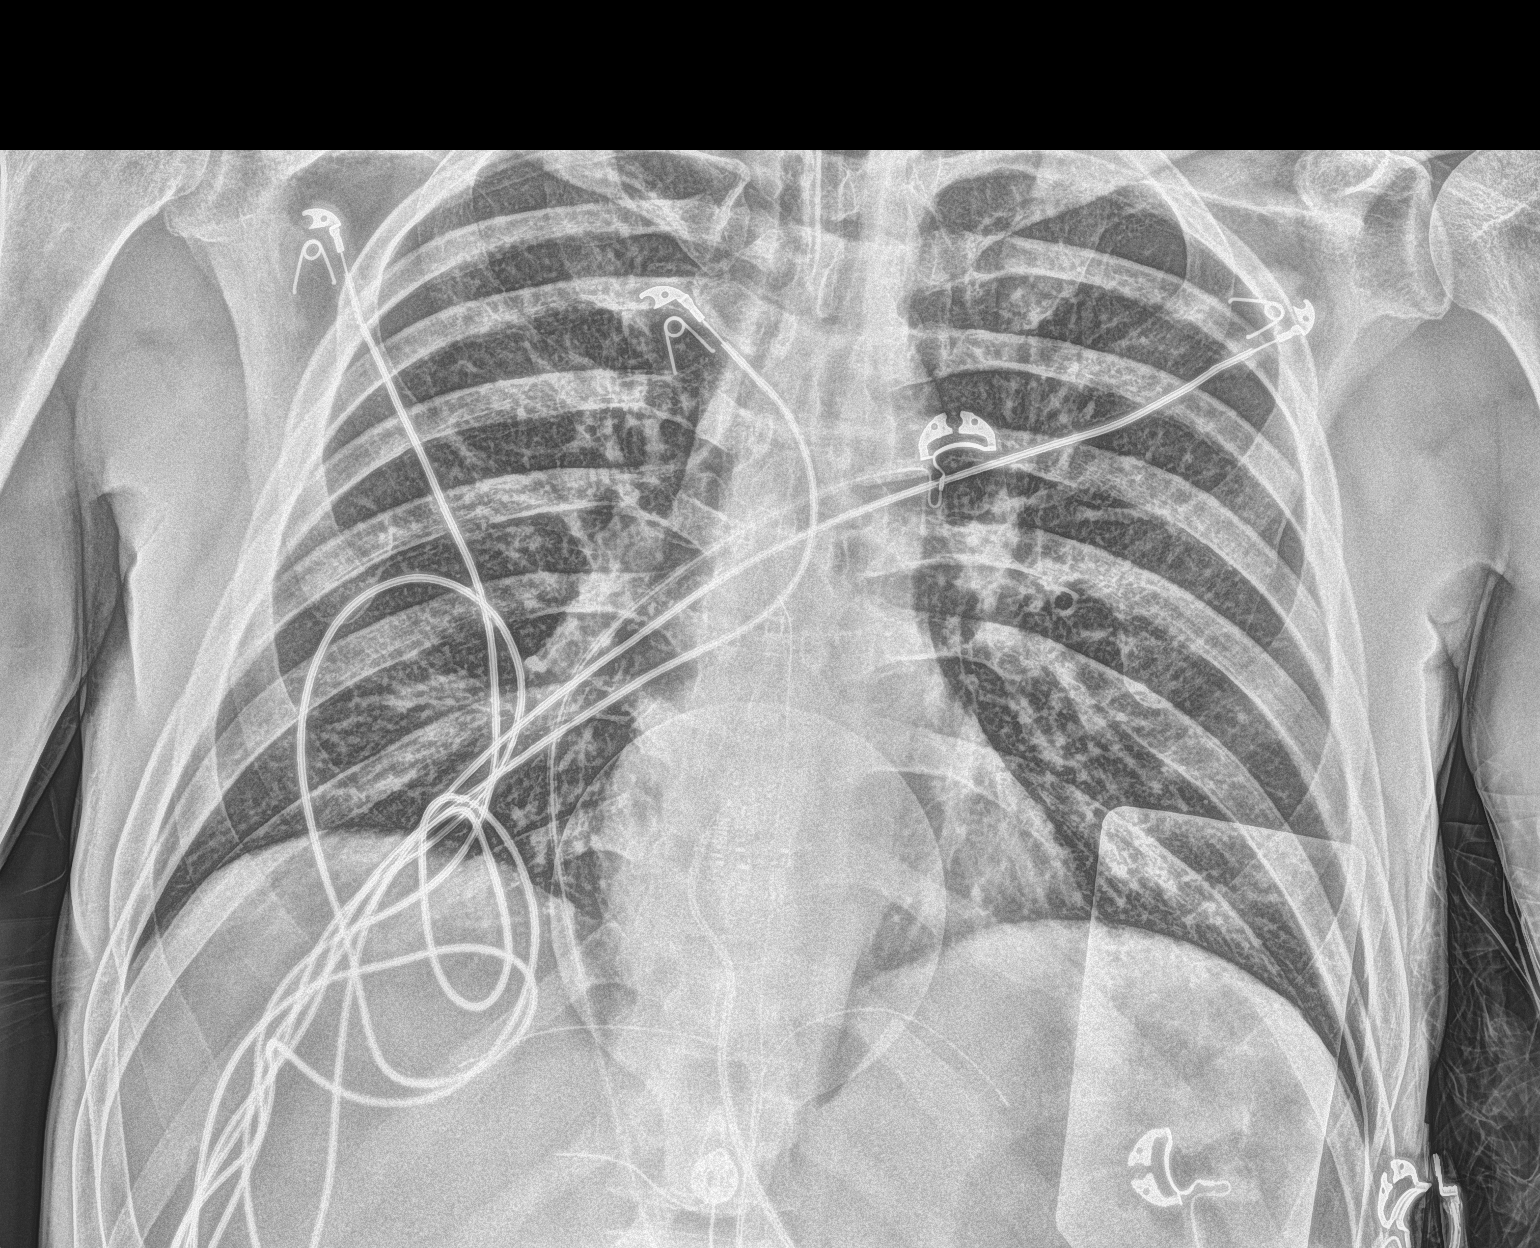

[2 of 2 positions shown; findings below may reference images not displayed]

FINDINGS: Endotracheal tube tip is at the level of the clavicular heads. The
lungs are clear. The cardiomediastinal contours are normal.
IMPRESSION: Endotracheal tube tip at the level of the clavicular heads.

## 2020-05-02 IMAGING — DX DG CHEST 1V PORT
1 series · 1 of 1 positions shown · non-contrast
Comparison: 03/01/2019

CLINICAL DATA: Central line placement

EXAM:
PORTABLE CHEST 1 VIEW

[chest ap]
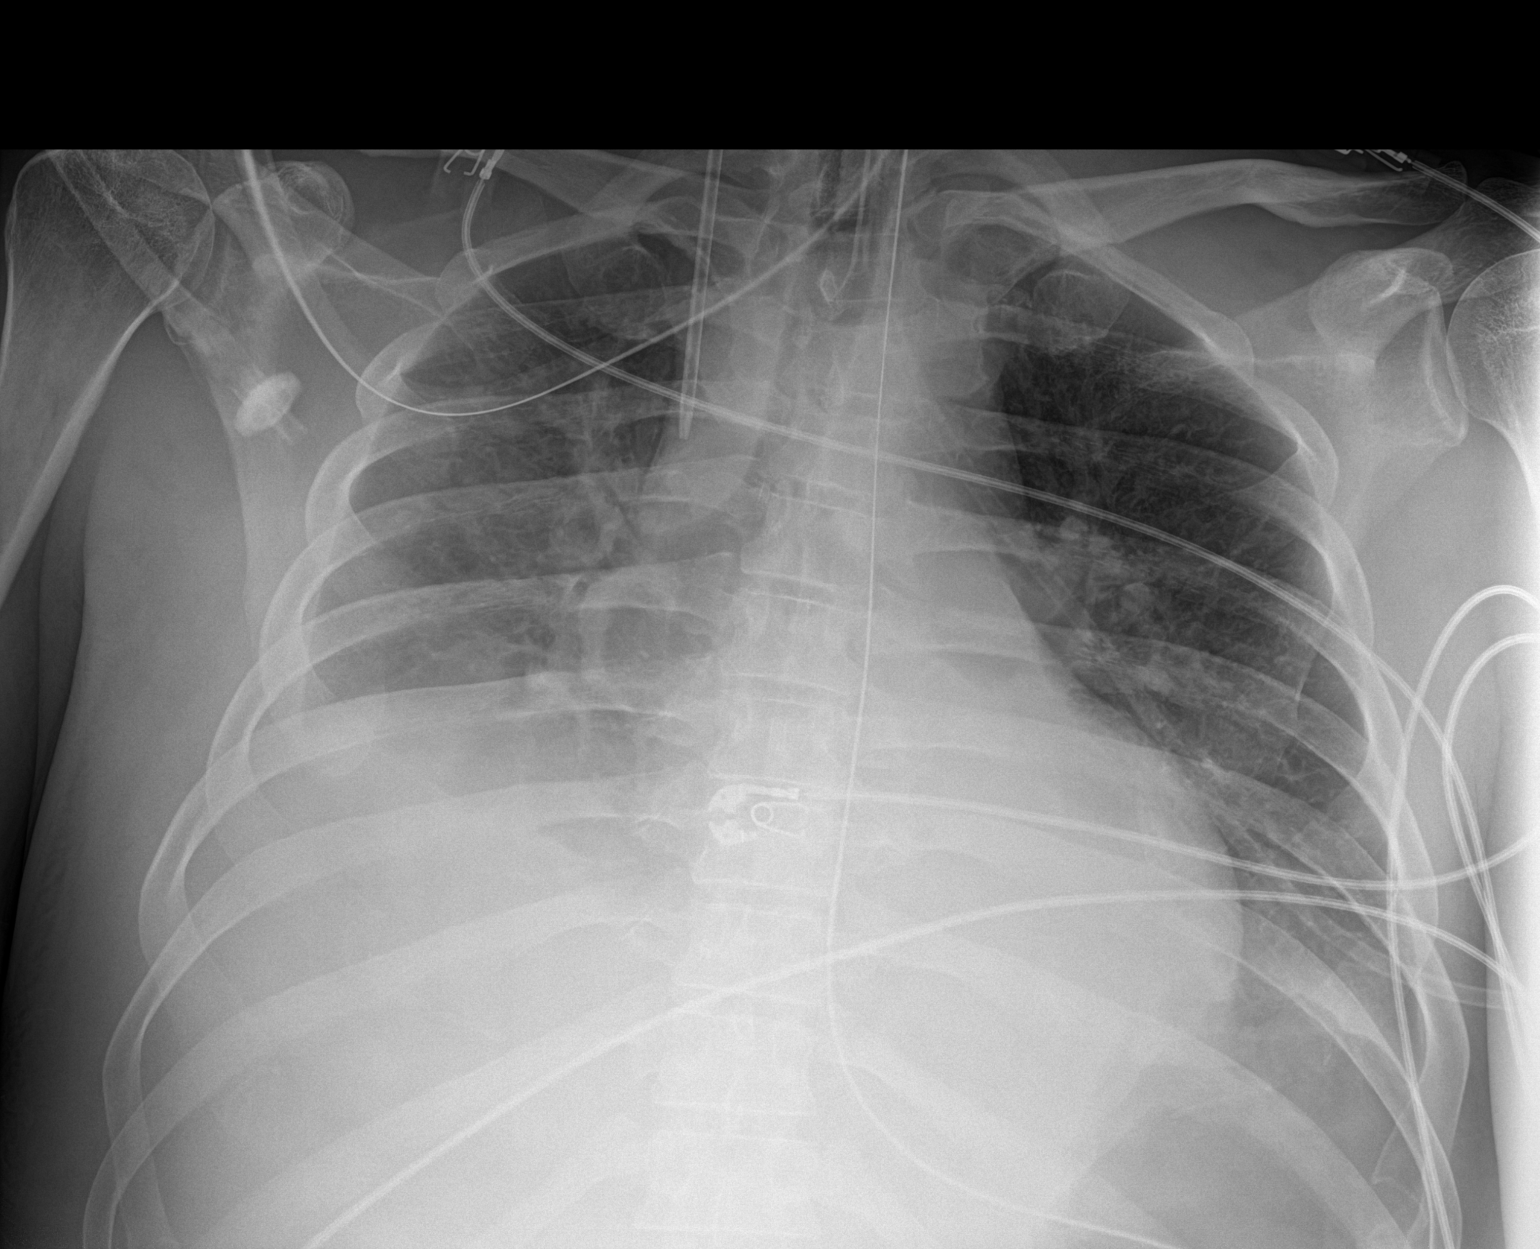

[1 of 1 positions shown; findings below may reference images not displayed]

FINDINGS: Interval placement of a right neck vascular catheter, tip projecting
near the confluence of the superior vena cava. Endotracheal tube
remains position just below the thoracic inlet. Esophagogastric tube
with tip and side port below the diaphragm. Unchanged moderate right
pleural effusion with associated atelectasis or consolidation and
probable small layering left pleural effusion. No new airspace
opacity.
IMPRESSION: 1. Interval placement of right neck vascular catheter, tip
projecting near the confluence of the superior vena cava.

2.  Otherwise unchanged AP portable examination.

## 2020-05-05 IMAGING — DX DG CHEST 1V PORT
1 series · 1 of 1 positions shown · non-contrast
Comparison: Portable exam 4494 hours compared to 03/02/2019

CLINICAL DATA: Pneumonia, 7OCA1-2M, type II diabetes mellitus at,
smoker, ethanol abuse

EXAM:
PORTABLE CHEST 1 VIEW

[chest ap]
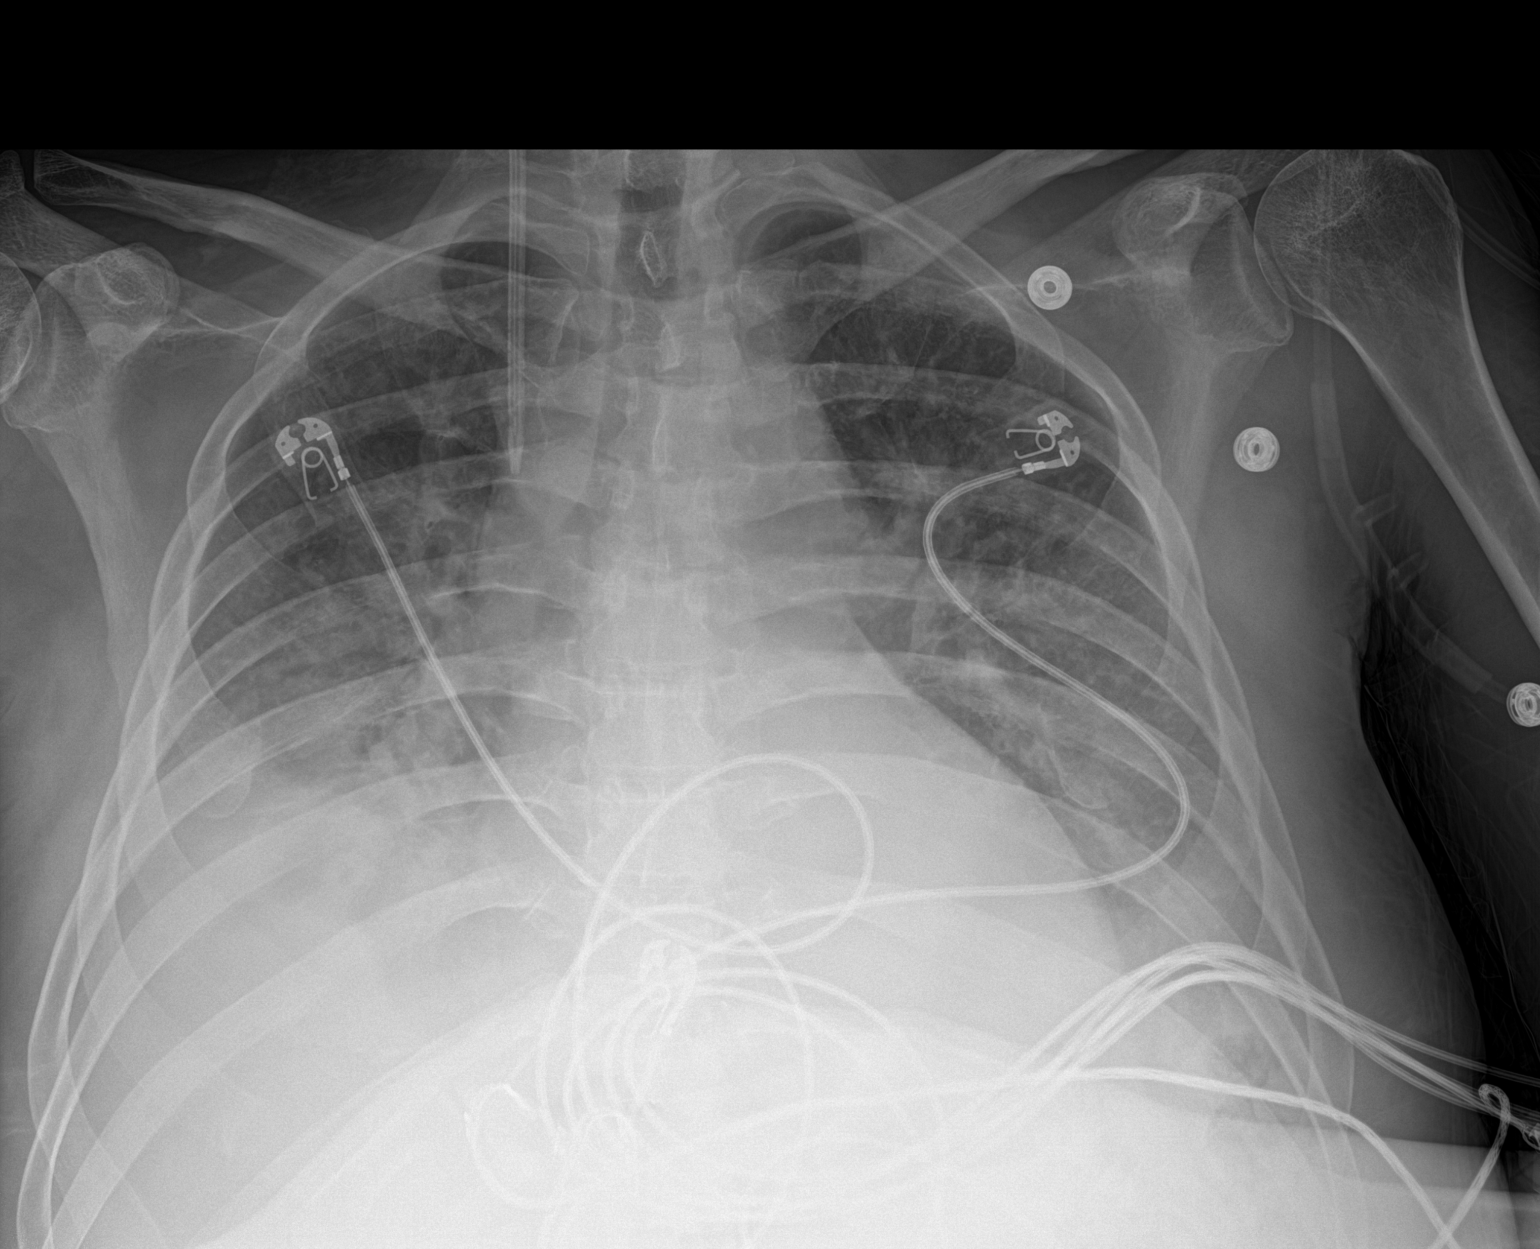

[1 of 1 positions shown; findings below may reference images not displayed]

FINDINGS: Interval removal of endotracheal and nasogastric tubes.

RIGHT jugular central venous catheter with tip projecting over SVC.

Normal heart size mediastinal contours.

Patchy BILATERAL pulmonary infiltrates consistent with pneumonia,
slightly increased in LEFT upper lobe since previous study.

No pleural effusion or pneumothorax.
IMPRESSION: BILATERAL pulmonary infiltrates consistent with multifocal
pneumonia, slightly increased in LEFT upper lobe since 03/02/2019.
